# Patient Record
Sex: Male | Born: 1970
Health system: Southern US, Community
[De-identification: ages and names within clinical notes are randomized; demographics above are authoritative.]

## PROBLEM LIST (undated history)

## (undated) DIAGNOSIS — F1910 Other psychoactive substance abuse, uncomplicated: Secondary | ICD-10-CM

## (undated) DIAGNOSIS — I10 Essential (primary) hypertension: Secondary | ICD-10-CM

## (undated) DIAGNOSIS — E119 Type 2 diabetes mellitus without complications: Secondary | ICD-10-CM

## (undated) HISTORY — DX: Other psychoactive substance abuse, uncomplicated: F19.10

## (undated) HISTORY — PX: TONSILLECTOMY: SUR1361

## (undated) HISTORY — DX: Essential (primary) hypertension: I10

---
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
## 2007-11-24 DIAGNOSIS — G4730 Sleep apnea, unspecified: Secondary | ICD-10-CM

## 2007-11-24 HISTORY — DX: Sleep apnea, unspecified: G47.30

## 2008-06-05 ENCOUNTER — Ambulatory Visit: Payer: Self-pay | Admitting: Family Medicine

## 2008-06-22 ENCOUNTER — Ambulatory Visit: Payer: Self-pay | Admitting: Family Medicine

## 2009-12-30 ENCOUNTER — Encounter: Admission: RE | Admit: 2009-12-30 | Discharge: 2009-12-30 | Payer: Self-pay | Admitting: Emergency Medicine

## 2010-12-14 ENCOUNTER — Encounter: Payer: Self-pay | Admitting: Emergency Medicine

## 2016-03-20 DIAGNOSIS — E669 Obesity, unspecified: Secondary | ICD-10-CM | POA: Insufficient documentation

## 2018-02-21 DIAGNOSIS — G4733 Obstructive sleep apnea (adult) (pediatric): Secondary | ICD-10-CM | POA: Diagnosis not present

## 2018-05-18 DIAGNOSIS — G4733 Obstructive sleep apnea (adult) (pediatric): Secondary | ICD-10-CM | POA: Diagnosis not present

## 2018-07-08 DIAGNOSIS — G4733 Obstructive sleep apnea (adult) (pediatric): Secondary | ICD-10-CM | POA: Diagnosis not present

## 2018-07-08 DIAGNOSIS — R030 Elevated blood-pressure reading, without diagnosis of hypertension: Secondary | ICD-10-CM | POA: Diagnosis not present

## 2018-07-08 DIAGNOSIS — E669 Obesity, unspecified: Secondary | ICD-10-CM | POA: Diagnosis not present

## 2018-08-18 DIAGNOSIS — G4733 Obstructive sleep apnea (adult) (pediatric): Secondary | ICD-10-CM | POA: Diagnosis not present

## 2018-11-28 DIAGNOSIS — G4733 Obstructive sleep apnea (adult) (pediatric): Secondary | ICD-10-CM | POA: Diagnosis not present

## 2018-12-29 DIAGNOSIS — G4733 Obstructive sleep apnea (adult) (pediatric): Secondary | ICD-10-CM | POA: Diagnosis not present

## 2019-01-27 DIAGNOSIS — G4733 Obstructive sleep apnea (adult) (pediatric): Secondary | ICD-10-CM | POA: Diagnosis not present

## 2019-02-27 DIAGNOSIS — G4733 Obstructive sleep apnea (adult) (pediatric): Secondary | ICD-10-CM | POA: Diagnosis not present

## 2019-06-12 DIAGNOSIS — G4733 Obstructive sleep apnea (adult) (pediatric): Secondary | ICD-10-CM | POA: Diagnosis not present

## 2019-06-28 DIAGNOSIS — L918 Other hypertrophic disorders of the skin: Secondary | ICD-10-CM | POA: Diagnosis not present

## 2019-06-28 DIAGNOSIS — L820 Inflamed seborrheic keratosis: Secondary | ICD-10-CM | POA: Diagnosis not present

## 2019-07-10 DIAGNOSIS — G4733 Obstructive sleep apnea (adult) (pediatric): Secondary | ICD-10-CM | POA: Diagnosis not present

## 2019-07-13 DIAGNOSIS — G4733 Obstructive sleep apnea (adult) (pediatric): Secondary | ICD-10-CM | POA: Diagnosis not present

## 2019-09-13 DIAGNOSIS — G4733 Obstructive sleep apnea (adult) (pediatric): Secondary | ICD-10-CM | POA: Diagnosis not present

## 2019-09-24 DIAGNOSIS — E119 Type 2 diabetes mellitus without complications: Secondary | ICD-10-CM

## 2019-09-24 HISTORY — DX: Type 2 diabetes mellitus without complications: E11.9

## 2019-10-15 ENCOUNTER — Other Ambulatory Visit: Payer: Self-pay

## 2019-10-15 ENCOUNTER — Emergency Department (HOSPITAL_COMMUNITY): Payer: BC Managed Care – PPO

## 2019-10-15 ENCOUNTER — Emergency Department (HOSPITAL_COMMUNITY)
Admission: EM | Admit: 2019-10-15 | Discharge: 2019-10-15 | Disposition: A | Discharge status: Home / Self Care | Payer: BC Managed Care – PPO | Attending: Emergency Medicine | Admitting: Emergency Medicine

## 2019-10-15 ENCOUNTER — Encounter (HOSPITAL_COMMUNITY): Payer: Self-pay

## 2019-10-15 DIAGNOSIS — R55 Syncope and collapse: Secondary | ICD-10-CM | POA: Diagnosis not present

## 2019-10-15 DIAGNOSIS — Z23 Encounter for immunization: Secondary | ICD-10-CM | POA: Diagnosis not present

## 2019-10-15 DIAGNOSIS — E119 Type 2 diabetes mellitus without complications: Secondary | ICD-10-CM | POA: Diagnosis not present

## 2019-10-15 DIAGNOSIS — R000 Tachycardia, unspecified: Secondary | ICD-10-CM | POA: Diagnosis not present

## 2019-10-15 DIAGNOSIS — S0001XA Abrasion of scalp, initial encounter: Secondary | ICD-10-CM | POA: Diagnosis not present

## 2019-10-15 DIAGNOSIS — S8992XA Unspecified injury of left lower leg, initial encounter: Secondary | ICD-10-CM | POA: Diagnosis not present

## 2019-10-15 DIAGNOSIS — S0990XA Unspecified injury of head, initial encounter: Secondary | ICD-10-CM | POA: Diagnosis not present

## 2019-10-15 DIAGNOSIS — S0101XA Laceration without foreign body of scalp, initial encounter: Secondary | ICD-10-CM | POA: Diagnosis not present

## 2019-10-15 DIAGNOSIS — R519 Headache, unspecified: Secondary | ICD-10-CM | POA: Diagnosis not present

## 2019-10-15 LAB — CBC
HCT: 50.1 % (ref 39.0–52.0)
Hemoglobin: 17.4 g/dL — ABNORMAL HIGH (ref 13.0–17.0)
MCH: 31.6 pg (ref 26.0–34.0)
MCHC: 34.7 g/dL (ref 30.0–36.0)
MCV: 90.9 fL (ref 80.0–100.0)
Platelets: 178 10*3/uL (ref 150–400)
RBC: 5.51 MIL/uL (ref 4.22–5.81)
RDW: 11.1 % — ABNORMAL LOW (ref 11.5–15.5)
WBC: 6.5 10*3/uL (ref 4.0–10.5)
nRBC: 0 % (ref 0.0–0.2)

## 2019-10-15 LAB — URINALYSIS, ROUTINE W REFLEX MICROSCOPIC
Bacteria, UA: NONE SEEN
Bilirubin Urine: NEGATIVE
Glucose, UA: >=500 mg/dL — AB
Hgb urine dipstick: NEGATIVE
Ketones, ur: 20 mg/dL — AB
Leukocytes,Ua: NEGATIVE
Nitrite: NEGATIVE
Protein, ur: NEGATIVE mg/dL
Specific Gravity, Urine: >1.046 — ABNORMAL HIGH (ref 1.005–1.030)
pH: 5 (ref 5.0–8.0)

## 2019-10-15 LAB — BASIC METABOLIC PANEL
Anion gap: 9 (ref 5–15)
BUN: 16 mg/dL (ref 6–20)
CO2: 23 mmol/L (ref 22–32)
Calcium: 9.4 mg/dL (ref 8.9–10.3)
Chloride: 103 mmol/L (ref 98–111)
Creatinine, Ser: 0.96 mg/dL (ref 0.61–1.24)
GFR calc Af Amer: >60 mL/min (ref >60)
GFR calc non Af Amer: >60 mL/min (ref >60)
Glucose, Bld: 372 mg/dL — ABNORMAL HIGH (ref 70–99)
Potassium: 4.2 mmol/L (ref 3.5–5.1)
Sodium: 135 mmol/L (ref 135–145)

## 2019-10-15 LAB — CBG MONITORING, ED: Glucose-Capillary: 387 mg/dL — ABNORMAL HIGH (ref 70–99)

## 2019-10-15 MED ORDER — METFORMIN HCL 500 MG PO TABS
500.0000 mg | ORAL_TABLET | Freq: Once | ORAL | Status: AC
  Administered 2019-10-15: 500 mg via ORAL
  Filled 2019-10-15: qty 1

## 2019-10-15 MED ORDER — SODIUM CHLORIDE 0.9 % IV BOLUS
1000.0000 mL | Freq: Once | INTRAVENOUS | Status: AC
  Administered 2019-10-15: 1000 mL via INTRAVENOUS

## 2019-10-15 MED ORDER — METFORMIN HCL 500 MG PO TABS: 500.0000 mg | ORAL_TABLET | Freq: Two times a day (BID) | ORAL | 2 refills | Status: DC

## 2019-10-15 MED ORDER — TETANUS-DIPHTH-ACELL PERTUSSIS 5-2.5-18.5 LF-MCG/0.5 IM SUSP
0.5000 mL | Freq: Once | INTRAMUSCULAR | Status: AC
  Administered 2019-10-15: 0.5 mL via INTRAMUSCULAR
  Filled 2019-10-15: qty 0.5

## 2019-10-15 MED ORDER — BACITRACIN ZINC 500 UNIT/GM EX OINT
TOPICAL_OINTMENT | Freq: Once | CUTANEOUS | Status: AC
  Administered 2019-10-15: 1 via TOPICAL
  Filled 2019-10-15: qty 0.9

## 2019-10-15 MED ORDER — SODIUM CHLORIDE 0.9% FLUSH
3.0000 mL | Freq: Once | INTRAVENOUS | Status: AC
  Administered 2019-10-15: 3 mL via INTRAVENOUS

## 2019-10-15 NOTE — Discharge Instructions (Addendum)
As we discussed today, your work-up today was reassuring.  Your blood work was concerning for diabetes.  Given concerns, we have started you on metformin which you will take twice a day.  You will need to follow-up with a primary care doctor to have continued prescription of your Metformin as well as an A1c checked.  As we discussed, Metformin can cause you to have some diarrhea.  Make sure you are drinking plenty of fluids and staying hydrated.  I provided some information about diabetes for you.  Ultimately, this needs further work-up by primary care doctor.  You can either follow-up with referred to [facility] wellness clinic or follow-up with your primary care doctor of your choosing.  Keep your wounds clean and dry.  You can wash them with soap and water and make sure that you pat them dry before putting any bandage.  You can apply Neosporin or bacitracin 2 times a day to prevent any infection.  Return the emergency department for any chest pain, difficulty breathing, loss of consciousness, vomiting, redness or swelling around the wounds, drainage from the wounds or any other worsening concerning symptoms.

## 2019-10-15 NOTE — ED Notes (Signed)
Lindsay PA at bedside.  

## 2019-10-15 NOTE — ED Provider Notes (Signed)
[place] DEPT Provider Note   CSN: [id number] Arrival date & time: 10/15/19  1543     History   Chief Complaint Chief Complaint  Patient presents with  . Loss of Consciousness    HPI Terry Bonilla is a 48 y.o. male who presents for evaluation of syncopal episode.  He states that about 2 PM this afternoon, he decided to go lay down for a nap.  He states that when he got on the bed, he felt dizzy.  He states he got up and felt dizzy and lightheaded and states that he had a syncopal episode and hit his head on the nightstand.  He thinks he may have had LOC for a few seconds.  He states that he was able to get up immediately afterwards and has been at baseline since then.  He has not had any preceding chest pain.  On ED arrival, he is complaining of slight headache where he hit his head on the nightstand.  He sustained a laceration/abrasion noted to the head.  He also reports some pain to his left knee where he hit it on the floor.  Patient states that he does not know when his last tetanus shot was.  He denies any chest pain, difficulty breathing, abdominal pain, numbness/weakness of his arms or legs.  He states he has been in his normal state of health and has not had any recent sicknesses.  He has noted he had some lower back pain over the last few days. He denies any hormone use, recent immobilization, prior history of DVT/PE, recent surgery, leg swelling, or long travel.     The history is provided by the patient.    History reviewed. No pertinent past medical history.  There are no active problems to display for this patient.    The histories are not reviewed yet. Please review them in the "History" navigator section and refresh this [place].      Home Medications    Prior to Admission medications   Medication Sig Start Date End Date Taking? Authorizing Provider  metFORMIN (GLUCOPHAGE) 500 MG tablet Take 1 tablet (500 mg total) by mouth 2  (two) times daily with a meal. 10/15/19 01/13/20  Volanda Napoleon, PA-C    Family History History reviewed. No pertinent family history.  Social History Social History   Tobacco Use  . Smoking status: Not on file  Substance Use Topics  . Alcohol use: Not on file  . Drug use: Not on file     Allergies   Sulfa antibiotics   Review of Systems Review of Systems  Constitutional: Negative for fever.  Respiratory: Negative for cough and shortness of breath.   Cardiovascular: Negative for chest pain.  Gastrointestinal: Negative for abdominal pain, nausea and vomiting.  Genitourinary: Negative for dysuria and hematuria.  Skin: Positive for wound.  Neurological: Positive for dizziness, syncope and light-headedness. Negative for headaches.  All other systems reviewed and are negative.    Physical Exam Updated Vital Signs BP 138/87   Pulse 95   Temp 98.4 F (36.9 C) (Oral)   Resp 18   SpO2 98%   Physical Exam Vitals signs and nursing note reviewed.  Constitutional:      Appearance: Normal appearance. He is well-developed.  HENT:     Head: Normocephalic and atraumatic.      Comments: Abrasion/laceration noted to the right parietal scalp.  Bleeding controlled.  No bony tenderness or skull deformity noted. Eyes:  General: Lids are normal.     Conjunctiva/sclera: Conjunctivae normal.     Pupils: Pupils are equal, round, and reactive to light.     Comments: PERRL. EOMs intact. No nystagmus. No neglect.   Neck:     Musculoskeletal: Full passive range of motion without pain.  Cardiovascular:     Rate and Rhythm: Normal rate and regular rhythm.     Pulses: Normal pulses.          Radial pulses are 2+ on the right side and 2+ on the left side.       Dorsalis pedis pulses are 2+ on the right side and 2+ on the left side.     Heart sounds: Normal heart sounds. No murmur. No friction rub. No gallop.   Pulmonary:     Effort: Pulmonary effort is normal.     Breath sounds:  Normal breath sounds.     Comments: Lungs clear to auscultation bilaterally.  Symmetric chest rise.  No wheezing, rales, rhonchi. Abdominal:     Palpations: Abdomen is soft. Abdomen is not rigid.     Tenderness: There is no abdominal tenderness. There is no guarding.     Comments: Abdomen is soft, non-distended, non-tender. No rigidity, No guarding. No peritoneal signs.  Musculoskeletal: Normal range of motion.     Comments: Midline L-spine tenderness.  Tenderness palpation noted to paraspinal muscles of the lower lumbar region the left side.  Tenderness palpation of the anterior aspect of left knee.  No deformity or crepitus noted.  No overlying soft tissue swelling, ecchymosis.  Skin:    General: Skin is warm and dry.     Capillary Refill: Capillary refill takes less than 2 seconds.     Comments: 3 cm abrasion/skin tear noted to the parietal scalp.  Scattered abrasions on the anterior aspect of left knee.  Neurological:     Mental Status: He is alert and oriented to person, place, and time.  Psychiatric:        Speech: Speech normal.      ED Treatments / Results  Labs (all labs ordered are listed, but only abnormal results are displayed) Labs Reviewed  BASIC METABOLIC PANEL - Abnormal; Notable for the following components:      Result Value   Glucose, Bld 372 (*)    All other components within normal limits  CBC - Abnormal; Notable for the following components:   Hemoglobin 17.4 (*)    RDW 11.1 (*)    All other components within normal limits  URINALYSIS, ROUTINE W REFLEX MICROSCOPIC - Abnormal; Notable for the following components:   Specific Gravity, Urine >1.046 (*)    Glucose, UA >=500 (*)    Ketones, ur 20 (*)    All other components within normal limits  CBG MONITORING, ED - Abnormal; Notable for the following components:   Glucose-Capillary 387 (*)    All other components within normal limits  CBG MONITORING, ED    EKG EKG Interpretation  Date/Time:  Sunday  October 15 2019 16:04:53 EST Ventricular Rate:  111 PR Interval:    QRS Duration: 88 QT Interval:  330 QTC Calculation: 449 R Axis:   131 Text Interpretation: Sinus tachycardia Right axis deviation Nonspecific T abnormalities, inferior leads Baseline wander in lead(s) V2 V3 V4 No old tracing to compare Confirmed by Sherwood Gambler [phone]) on 10/15/2019 6:28:32 PM   Radiology Ct Head Wo Contrast  Result Date: 10/15/2019 CLINICAL DATA:  Head trauma.  Headache. EXAM: CT HEAD WITHOUT  CONTRAST TECHNIQUE: Contiguous axial images were obtained from the base of the skull through the vertex without intravenous contrast. COMPARISON:  None. FINDINGS: Brain: No evidence of acute infarction, hemorrhage, hydrocephalus, extra-axial collection or mass lesion/mass effect. Vascular: No hyperdense vessel or unexpected calcification. Skull: Normal. Negative for fracture or focal lesion. Sinuses/Orbits: Globes and orbits are unremarkable. Sinuses and mastoid air cells are clear. Other: None. IMPRESSION: Normal enhanced CT scan of the brain. Electronically Signed   By: Lajean Manes M.D.   On: 10/15/2019 19:19   Dg Knee Complete 4 Views Left  Result Date: 10/15/2019 CLINICAL DATA:  Recent syncopal episode with knee injury, initial encounter EXAM: LEFT KNEE - COMPLETE 4+ VIEW COMPARISON:  None FINDINGS: No evidence of fracture, dislocation, or joint effusion. No evidence of arthropathy or other focal bone abnormality. Soft tissues are unremarkable. IMPRESSION: No acute abnormality noted. Electronically Signed   By: Inez Catalina M.D.   On: 10/15/2019 19:07    Procedures Procedures (including critical care time)  Medications Ordered in ED Medications  sodium chloride flush (NS) 0.9 % injection 3 mL (3 mLs Intravenous Given 10/15/19 1839)  Tdap (BOOSTRIX) injection 0.5 mL (0.5 mLs Intramuscular Given 10/15/19 2010)  sodium chloride 0.9 % bolus 1,000 mL (1,000 mLs Intravenous New Bag/Given 10/15/19 2018)   bacitracin ointment (1 application Topical Given 10/15/19 2059)  metFORMIN (GLUCOPHAGE) tablet 500 mg (500 mg Oral Given 10/15/19 2056)     Initial Impression / Assessment and Plan / ED Course  I have reviewed the triage vital signs and the nursing notes.  Pertinent labs & imaging results that were available during my care of the patient were reviewed by me and considered in my medical decision making (see chart for details).        48 year old male who presents for evaluation of syncopal episode.  Reports that he went to go take a nap about 2 PM this afternoon.  He states that when he went to go take a nap, he felt dizzy and stood up and had a syncopal episode.  He hit his head against the nightstand.  No chest pain.  On initial ED arrival,, he is afebrile, nontoxic-appearing.  He is slightly hypertensive and tachycardic.  O2 sats are stable.  No neuro deficits noted on exam.  He has no nystagmus.  Do not suspect central or peripheral vertigo.  Question of this was orthostatic hypotension.  History/physical exam not concerning for ACS etiology, aortic dissection, PE.  Will plan for labs, imaging, EKG.  Given that he did have LOC, will plan for head CT for evaluation.  CBC shows no leukocytosis. Hgb is stable at 17.4. BMP shows normal BUN/Cr. Blood glucose is elevated at 372. He has no history of DM. UA shows glucosuria. No evidence of infectious etiology.   Given concerns for diabetes, will plan to start him on Metformin.  CT negative for any acute abnormalities.  Reevaluation after wound cleaning.  He has a skin tear noted on the right parietal aspect of his scalp.  No evidence of laceration that requires suturing.  Wound cleaned and will apply bacitracin.  Patient resting comfortably.  He is hemodynamically stable.  I discussed with patient regarding starting Metformin.  He is agreeable to plan.  He does not currently have a PCP.  Instructed him to either follow-up with [facility]  cl[facility] or obtain a primary care doctor.  He has been ambulatory without any difficulty.  He denies any complaints at this time.  Patient stable  for discharge at this time. Discussed patient with Dr. Regenia Skeeter who is agreeable to plan.   Portions of this note were generated with [employer]. Dictation errors may occur despite best attempts at proofreading.   Final Clinical Impressions(s) / ED Diagnoses   Final diagnoses:  Syncope, unspecified syncope type  Type 2 diabetes mellitus without complication, without long-term current use of insulin [facility])    ED Discharge Orders         Ordered    metFORMIN (GLUCOPHAGE) 500 MG tablet  2 times daily with meals     10/15/19 2126           Desma Mcgregor 10/15/19 2128    Sherwood Gambler, MD 10/16/19 1022

## 2019-10-15 NOTE — ED Notes (Signed)
BLEEDING CONTROLLED TO LACERATION OF THE HEAD. PT ALERT AND ACTIVE WITH CARE

## 2019-10-15 NOTE — ED Triage Notes (Signed)
Pt states he was about to get in bed for a nap when he got dizzy laying down. Pt then sat up, and then stood up, causing him to "pass out for a second". Pt has skin tear on right side of head and abrasion on left knee. Pt A&Ox4.

## 2019-10-15 NOTE — ED Notes (Signed)
Pt verbalizes understanding of DC instructions. Pt belongings returned and is ambulatory out of ED.  

## 2019-10-18 ENCOUNTER — Emergency Department (HOSPITAL_COMMUNITY)
Admission: EM | Admit: 2019-10-18 | Discharge: 2019-10-18 | Disposition: A | Discharge status: Home / Self Care | Payer: BC Managed Care – PPO | Attending: Emergency Medicine | Admitting: Emergency Medicine

## 2019-10-18 ENCOUNTER — Other Ambulatory Visit: Payer: Self-pay

## 2019-10-18 ENCOUNTER — Encounter (HOSPITAL_COMMUNITY): Payer: Self-pay

## 2019-10-18 ENCOUNTER — Ambulatory Visit: Payer: Self-pay

## 2019-10-18 DIAGNOSIS — E119 Type 2 diabetes mellitus without complications: Secondary | ICD-10-CM | POA: Insufficient documentation

## 2019-10-18 DIAGNOSIS — R002 Palpitations: Secondary | ICD-10-CM | POA: Insufficient documentation

## 2019-10-18 DIAGNOSIS — R42 Dizziness and giddiness: Secondary | ICD-10-CM | POA: Diagnosis not present

## 2019-10-18 DIAGNOSIS — I10 Essential (primary) hypertension: Secondary | ICD-10-CM | POA: Diagnosis not present

## 2019-10-18 DIAGNOSIS — I499 Cardiac arrhythmia, unspecified: Secondary | ICD-10-CM | POA: Diagnosis not present

## 2019-10-18 DIAGNOSIS — R55 Syncope and collapse: Secondary | ICD-10-CM | POA: Diagnosis not present

## 2019-10-18 DIAGNOSIS — Z79899 Other long term (current) drug therapy: Secondary | ICD-10-CM | POA: Diagnosis not present

## 2019-10-18 DIAGNOSIS — E1165 Type 2 diabetes mellitus with hyperglycemia: Secondary | ICD-10-CM | POA: Diagnosis not present

## 2019-10-18 LAB — COMPREHENSIVE METABOLIC PANEL
ALT: 51 U/L — ABNORMAL HIGH (ref 0–44)
AST: 34 U/L (ref 15–41)
Albumin: 4.6 g/dL (ref 3.5–5.0)
Alkaline Phosphatase: 70 U/L (ref 38–126)
Anion gap: 9 (ref 5–15)
BUN: 12 mg/dL (ref 6–20)
CO2: 25 mmol/L (ref 22–32)
Calcium: 9.1 mg/dL (ref 8.9–10.3)
Chloride: 103 mmol/L (ref 98–111)
Creatinine, Ser: 0.68 mg/dL (ref 0.61–1.24)
GFR calc Af Amer: >60 mL/min (ref >60)
GFR calc non Af Amer: >60 mL/min (ref >60)
Glucose, Bld: 197 mg/dL — ABNORMAL HIGH (ref 70–99)
Potassium: 3.9 mmol/L (ref 3.5–5.1)
Sodium: 137 mmol/L (ref 135–145)
Total Bilirubin: 1.2 mg/dL (ref 0.3–1.2)
Total Protein: 7.1 g/dL (ref 6.5–8.1)

## 2019-10-18 LAB — CBC WITH DIFFERENTIAL/PLATELET
Abs Immature Granulocytes: 0.03 10*3/uL (ref 0.00–0.07)
Basophils Absolute: 0 10*3/uL (ref 0.0–0.1)
Basophils Relative: 1 %
Eosinophils Absolute: 0 10*3/uL (ref 0.0–0.5)
Eosinophils Relative: 0 %
HCT: 46.5 % (ref 39.0–52.0)
Hemoglobin: 15.9 g/dL (ref 13.0–17.0)
Immature Granulocytes: 1 %
Lymphocytes Relative: 19 %
Lymphs Abs: 1.1 10*3/uL (ref 0.7–4.0)
MCH: 31.1 pg (ref 26.0–34.0)
MCHC: 34.2 g/dL (ref 30.0–36.0)
MCV: 91 fL (ref 80.0–100.0)
Monocytes Absolute: 0.3 10*3/uL (ref 0.1–1.0)
Monocytes Relative: 6 %
Neutro Abs: 4 10*3/uL (ref 1.7–7.7)
Neutrophils Relative %: 73 %
Platelets: 167 10*3/uL (ref 150–400)
RBC: 5.11 MIL/uL (ref 4.22–5.81)
RDW: 11.1 % — ABNORMAL LOW (ref 11.5–15.5)
WBC: 5.5 10*3/uL (ref 4.0–10.5)
nRBC: 0 % (ref 0.0–0.2)

## 2019-10-18 MED ORDER — METFORMIN HCL ER 750 MG PO TB24: 750.0000 mg | ORAL_TABLET | Freq: Every day | ORAL | 0 refills | Status: DC

## 2019-10-18 MED ORDER — SODIUM CHLORIDE 0.9 % IV BOLUS
1000.0000 mL | Freq: Once | INTRAVENOUS | Status: AC
  Administered 2019-10-18: 1000 mL via INTRAVENOUS

## 2019-10-18 NOTE — Telephone Encounter (Signed)
Pt. Reports he was seen in ED recently and diagnosed as Type 2 diabetic. Started Metformin. Reports since starting the medication, he has had dizziness after taking the medicine. Last night it was so bad he called EMS. Had heart palpitations as well. EMS said he had stable vital signs and he felt better. Was fine this morning until he took the Metformin and became dizzy shortly after. Warm transfer to [facility] in the practice for a visit.  Answer Assessment - Initial Assessment Questions 1. DESCRIPTION: "Describe your dizziness."     Lightheaded 2. LIGHTHEADED: "Do you feel lightheaded?" (e.g., somewhat faint, woozy, weak upon standing)     Yes 3. VERTIGO: "Do you feel like either you or the room is spinning or tilting?" (i.e. vertigo)     No 4. SEVERITY: "How bad is it?"  "Do you feel like you are going to faint?" "Can you stand and walk?"   - MILD - walking normally   - MODERATE - interferes with normal activities (e.g., work, school)    - SEVERE - unable to stand, requires support to walk, feels like passing out now.      Moderate 5. ONSET:  "When did the dizziness begin?"     Started last night 6. AGGRAVATING FACTORS: "Does anything make it worse?" (e.g., standing, change in head position)     Moving around 7. HEART RATE: "Can you tell me your heart rate?" "How many beats in 15 seconds?"  (Note: not all patients can do this)       Pulse last night - normal 8. CAUSE: "What do you think is causing the dizziness?"     Pt. Feels like it is related to Metformin 9. RECURRENT SYMPTOM: "Have you had dizziness before?" If so, ask: "When was the last time?" "What happened that time?"     No 10. OTHER SYMPTOMS: "Do you have any other symptoms?" (e.g., fever, chest pain, vomiting, diarrhea, bleeding)       Heart fluttering last night 11. PREGNANCY: "Is there any chance you are pregnant?" "When was your last menstrual period?"       N/A  Protocols used: DIZZINESS [facility]

## 2019-10-18 NOTE — ED Notes (Signed)
Pt verbalizes understanding of DC instructions. Pt belongings returned and is ambulatory out of ED.  

## 2019-10-18 NOTE — Discharge Instructions (Addendum)
You are being prescribed extended release Metformin.  Stop your previous prescription start this one tomorrow morning.  If you develop palpitations that do not go away, dizziness, chest pain, shortness of breath, or any other new/concerning symptoms then return to the ER for evaluation.

## 2019-10-18 NOTE — Telephone Encounter (Signed)
I talked to Dr Zigmund Daniel and he was going to look into this and have someone call pt back

## 2019-10-18 NOTE — ED Notes (Signed)
Regenia Skeeter MD at bedside

## 2019-10-18 NOTE — ED Triage Notes (Signed)
  PT arrived via [facility] from home CC dizziness and palpitation. Pt denies pain,  syncope or falling. CBG 235  Hx recent diabetes diagnosis started metformin.  18G LAC 500 mlNS

## 2019-10-18 NOTE — ED Provider Notes (Signed)
[place] DEPT Provider Note   CSN: [id number] Arrival date & time: 10/18/19  1425     History   Chief Complaint Chief Complaint  Patient presents with  . Dizziness  . Palpitations    HPI Terry Bonilla is a 48 y.o. male.     HPI  48 year old male presents with palpitations and near syncope.  The patient states that he was here 3 days ago when he stood up and developed lightheadedness and fell.  Had a head injury and was diagnosed with diabetes.  Placed on Metformin.  Yesterday he developed palpitations and lightheadedness and EMS was called.  After an hour or so he started to feel better so he was not transported.  He states this was an hour or 2 after taking the Metformin but is not sure if they are related.  Today again this morning he developed palpitations and dizziness the felt like he would pass out.  Lasted multiple hours.  Went away and then came back so he called EMS.  Currently while talking to him he is asymptomatic.  No chest pain or shortness of breath.  No headache or weakness.  He has been having diarrhea since starting the Metformin. His toes have been tingling.  Past Medical History:  Diagnosis Date  . Diabetes mellitus without complication ([place])     There are no active problems to display for this patient.   History reviewed. No pertinent surgical history.      Home Medications    Prior to Admission medications   Medication Sig Start Date End Date Taking? Authorizing Provider  betamethasone dipropionate 0.05 % cream Apply 1 application topically as needed. 02/04/17  Yes [provider]  finasteride (PROPECIA) 1 MG tablet Take 1 tablet by mouth daily. 12/11/13  Yes [provider]  metFORMIN (GLUCOPHAGE-XR) 750 MG 24 hr tablet Take 1 tablet (750 mg total) by mouth daily with breakfast. 10/18/19   Sherwood Gambler, MD    Family History No family history on file.  Social History Social History    Tobacco Use  . Smoking status: Never Smoker  . Smokeless tobacco: Never Used  Substance Use Topics  . Alcohol use: Not on file  . Drug use: Not on file     Allergies   Crab [shellfish allergy] and Sulfa antibiotics   Review of Systems Review of Systems  Constitutional: Negative for fever.  Respiratory: Negative for shortness of breath.   Cardiovascular: Positive for palpitations. Negative for chest pain.  Gastrointestinal: Positive for diarrhea and nausea. Negative for abdominal pain and vomiting.  Neurological: Positive for light-headedness. Negative for weakness and headaches.  All other systems reviewed and are negative.    Physical Exam Updated Vital Signs BP (!) 138/95   Pulse 74   Temp 98.2 F (36.8 C) (Oral)   Resp 17   Ht 6\' 3"  (1.905 m)   Wt 120.2 kg   SpO2 97%   BMI 33.12 kg/m   Physical Exam Vitals signs and nursing note reviewed.  Constitutional:      General: He is not in acute distress.    Appearance: He is well-developed. He is obese. He is not ill-appearing or diaphoretic.  HENT:     Head: Normocephalic and atraumatic.     Right Ear: External ear normal.     Left Ear: External ear normal.     Nose: Nose normal.  Eyes:     General:        Right  eye: No discharge.        Left eye: No discharge.     Extraocular Movements: Extraocular movements intact.     Pupils: Pupils are equal, round, and reactive to light.  Neck:     Musculoskeletal: Neck supple.  Cardiovascular:     Rate and Rhythm: Normal rate and regular rhythm.     Heart sounds: Normal heart sounds. No murmur.  Pulmonary:     Effort: Pulmonary effort is normal.     Breath sounds: Normal breath sounds.  Abdominal:     Palpations: Abdomen is soft.     Tenderness: There is no abdominal tenderness.  Skin:    General: Skin is warm and dry.  Neurological:     Mental Status: He is alert and oriented to person, place, and time.     Comments: CN 3-12 grossly intact. 5/5 strength in all  4 extremities. Grossly normal sensation. Normal finger to nose.   Psychiatric:        Mood and Affect: Mood is not anxious.      ED Treatments / Results  Labs (all labs ordered are listed, but only abnormal results are displayed) Labs Reviewed  COMPREHENSIVE METABOLIC PANEL - Abnormal; Notable for the following components:      Result Value   Glucose, Bld 197 (*)    ALT 51 (*)    All other components within normal limits  CBC WITH DIFFERENTIAL/PLATELET - Abnormal; Notable for the following components:   RDW 11.1 (*)    All other components within normal limits    EKG EKG Interpretation  Date/Time:  Wednesday October 18 2019 15:14:22 EST Ventricular Rate:  83 PR Interval:    QRS Duration: 93 QT Interval:  370 QTC Calculation: 435 R Axis:   85 Text Interpretation: Sinus rhythm no acute ST/T changes tachycardia no longer present compared to Oct 15 2019 Confirmed by Sherwood Gambler [phone]) on 10/18/2019 4:28:44 PM   Radiology No results found.  Procedures Procedures (including critical care time)  Medications Ordered in ED Medications  sodium chloride 0.9 % bolus 1,000 mL (0 mLs Intravenous Stopped 10/18/19 1727)     Initial Impression / Assessment and Plan / ED Course  I have reviewed the triage vital signs and the nursing notes.  Pertinent labs & imaging results that were available during my care of the patient were reviewed by me and considered in my medical decision making (see chart for details).        Patient's lab work is reassuring.  ECG without obvious acute abnormalities or obvious cause of near syncope such as Brugada or WPW.  He is asking about changing his Metformin.  This could be a cause/side effect and we he would like to try extended release and see if this is different.  Otherwise he has follow-up on 11/30.  I will also refer to cardiology given the palpitations component and he probably would be benefited from Holter monitoring.  Otherwise he  appears stable for discharge home and has no strokelike symptoms.  We discussed return precautions.  Final Clinical Impressions(s) / ED Diagnoses   Final diagnoses:  Palpitations  Lightheadedness    ED Discharge Orders         Ordered    metFORMIN (GLUCOPHAGE-XR) 750 MG 24 hr tablet  Daily with breakfast     10/18/19 1750           Sherwood Gambler, MD 10/18/19 1756

## 2019-10-18 NOTE — Telephone Encounter (Signed)
Patient said he'll continue meds until visit on Monday.

## 2019-10-18 NOTE — Telephone Encounter (Signed)
Previous notes and labs reviewed. Blood glucose in ED >300.  The dizziness is likely coming from his body adjusting to a new, more normal blood sugar for him.  As he continues medication and his body adjusts this should improve.  He should continue current medication until seen on Monday.

## 2019-10-23 ENCOUNTER — Encounter: Payer: Self-pay | Admitting: Family Medicine

## 2019-10-23 ENCOUNTER — Ambulatory Visit (INDEPENDENT_AMBULATORY_CARE_PROVIDER_SITE_OTHER): Payer: BC Managed Care – PPO | Admitting: Family Medicine

## 2019-10-23 ENCOUNTER — Other Ambulatory Visit: Payer: Self-pay

## 2019-10-23 VITALS — BP 124/72 | HR 93 | Temp 97.8°F | Ht 75.0 in

## 2019-10-23 DIAGNOSIS — E1165 Type 2 diabetes mellitus with hyperglycemia: Secondary | ICD-10-CM

## 2019-10-23 DIAGNOSIS — Z125 Encounter for screening for malignant neoplasm of prostate: Secondary | ICD-10-CM | POA: Diagnosis not present

## 2019-10-23 DIAGNOSIS — E1140 Type 2 diabetes mellitus with diabetic neuropathy, unspecified: Secondary | ICD-10-CM | POA: Diagnosis not present

## 2019-10-23 DIAGNOSIS — Z23 Encounter for immunization: Secondary | ICD-10-CM

## 2019-10-23 DIAGNOSIS — D229 Melanocytic nevi, unspecified: Secondary | ICD-10-CM

## 2019-10-23 DIAGNOSIS — Z0000 Encounter for general adult medical examination without abnormal findings: Secondary | ICD-10-CM | POA: Diagnosis not present

## 2019-10-23 DIAGNOSIS — Z130 Encounter for screening for diseases of the blood and blood-forming organs and certain disorders involving the immune mechanism: Secondary | ICD-10-CM | POA: Insufficient documentation

## 2019-10-23 LAB — CBC
HCT: 47.4 % (ref 39.0–52.0)
Hemoglobin: 16.8 g/dL (ref 13.0–17.0)
MCHC: 35.5 g/dL (ref 30.0–36.0)
MCV: 90.7 fl (ref 78.0–100.0)
Platelets: 179 10*3/uL (ref 150.0–400.0)
RBC: 5.22 Mil/uL (ref 4.22–5.81)
RDW: 12.3 % (ref 11.5–15.5)
WBC: 4.2 10*3/uL (ref 4.0–10.5)

## 2019-10-23 LAB — URINALYSIS, ROUTINE W REFLEX MICROSCOPIC
Bilirubin Urine: NEGATIVE
Hgb urine dipstick: NEGATIVE
Ketones, ur: 15 — AB
Leukocytes,Ua: NEGATIVE
Nitrite: NEGATIVE
RBC / HPF: NONE SEEN (ref 0–?)
Specific Gravity, Urine: 1.025 (ref 1.000–1.030)
Total Protein, Urine: NEGATIVE
Urine Glucose: 500 — AB
Urobilinogen, UA: 0.2 (ref 0.0–1.0)
pH: 6 (ref 5.0–8.0)

## 2019-10-23 LAB — MICROALBUMIN / CREATININE URINE RATIO
Creatinine,U: 155.7 mg/dL
Microalb Creat Ratio: 1.2 mg/g (ref 0.0–30.0)
Microalb, Ur: 1.8 mg/dL (ref 0.0–1.9)

## 2019-10-23 LAB — HEMOGLOBIN A1C: Hgb A1c MFr Bld: 10 % — ABNORMAL HIGH (ref 4.6–6.5)

## 2019-10-23 LAB — LIPID PANEL
Cholesterol: 160 mg/dL (ref 0–200)
HDL: 27.3 mg/dL — ABNORMAL LOW (ref >39.00)
LDL Cholesterol: 113 mg/dL — ABNORMAL HIGH (ref 0–99)
NonHDL: 132.21
Total CHOL/HDL Ratio: 6
Triglycerides: 97 mg/dL (ref 0.0–149.0)
VLDL: 19.4 mg/dL (ref 0.0–40.0)

## 2019-10-23 LAB — COMPREHENSIVE METABOLIC PANEL
ALT: 76 U/L — ABNORMAL HIGH (ref 0–53)
AST: 45 U/L — ABNORMAL HIGH (ref 0–37)
Albumin: 4.2 g/dL (ref 3.5–5.2)
Alkaline Phosphatase: 69 U/L (ref 39–117)
BUN: 18 mg/dL (ref 6–23)
CO2: 22 mEq/L (ref 19–32)
Calcium: 9.1 mg/dL (ref 8.4–10.5)
Chloride: 104 mEq/L (ref 96–112)
Creatinine, Ser: 0.66 mg/dL (ref 0.40–1.50)
GFR: 127.46 mL/min (ref >60.00)
Glucose, Bld: 176 mg/dL — ABNORMAL HIGH (ref 70–99)
Potassium: 3.7 mEq/L (ref 3.5–5.1)
Sodium: 135 mEq/L (ref 135–145)
Total Bilirubin: 0.9 mg/dL (ref 0.2–1.2)
Total Protein: 6.5 g/dL (ref 6.0–8.3)

## 2019-10-23 LAB — LDL CHOLESTEROL, DIRECT: Direct LDL: 118 mg/dL

## 2019-10-23 LAB — PSA: PSA: 0.23 ng/mL (ref 0.10–4.00)

## 2019-10-23 NOTE — Patient Instructions (Signed)
Type 2 Diabetes Mellitus, Self Care, Adult  Caring for yourself after you have been diagnosed with type 2 diabetes (type 2 diabetes mellitus) means keeping your blood sugar (glucose) under control with a balance of:   Nutrition.   Exercise.   Lifestyle changes.   Medicines or insulin, if necessary.   Support from your team of health care providers and others.  The following information explains what you need to know to manage your diabetes at home.  What are the risks?  Having diabetes can put you at risk for other long-term (chronic) conditions, such as heart disease and kidney disease. Your health care provider may prescribe medicines to help prevent complications from diabetes. These medicines may include:   Aspirin.   Medicine to lower cholesterol.   Medicine to control blood pressure.  How to monitor blood glucose     Check your blood glucose every day, as often as told by your health care provider.   Have your A1c (hemoglobin A1c) level checked two or more times a year, or as often as told by your health care provider.  Your health care provider will set individualized treatment goals for you. Generally, the goal of treatment is to maintain the following blood glucose levels:   Before meals (preprandial): 80-130 mg/dL (4.4-7.2 mmol/L).   After meals (postprandial): below 180 mg/dL (10 mmol/L).   A1c level: less than 7%.  How to manage hyperglycemia and hypoglycemia  Hyperglycemia symptoms  Hyperglycemia, also called high blood glucose, occurs when blood glucose is too high. Make sure you know the early signs of hyperglycemia, such as:   Increased thirst.   Hunger.   Feeling very tired.   Needing to urinate more often than usual.   Blurry vision.  Hypoglycemia symptoms  Hypoglycemia, also called low blood glucose, occurswith a blood glucose level at or below 70 mg/dL (3.9 mmol/L). The risk for hypoglycemia increases during or after exercise, during sleep, during illness, and when skipping  meals or not eating for a long time (fasting).  It is important to know the symptoms of hypoglycemia and treat it right away. Always have a 15-gram rapid-acting carbohydrate snack with you to treat low blood glucose. Family members and close friends should also know the symptoms and should understand how to treat hypoglycemia, in case you are not able to treat yourself. Symptoms may include:   Hunger.   Anxiety.   Sweating and feeling clammy.   Confusion.   Dizziness or feeling light-headed.   Sleepiness.   Nausea.   Increased heart rate.   Headache.   Blurry vision.   Irritability.   A change in coordination.   Tingling or numbness around the mouth, lips, or tongue.   Restless sleep.   Fainting.   Seizure.  Treating hypoglycemia  If you are alert and able to swallow safely, follow the 15:15 rule:   Take 15 grams of a rapid-acting carbohydrate. Talk with your health care provider about how much you should take.   Rapid-acting options include:  ? Glucose pills (take 15 grams).  ? 6-8 pieces of hard candy.  ? 4-6 oz (120-150 mL) of fruit juice.  ? 4-6 oz (120-150 mL) of regular (not diet) soda.  ? 1 Tbsp (15 mL) honey or sugar.   Check your blood glucose 15 minutes after you take the carbohydrate.   If the repeat blood glucose level is still at or below 70 mg/dL (3.9 mmol/L), take 15 grams of a carbohydrate again.     is an emergency. Do not wait to see if the symptoms will go away. Get medical help right away. Call your local emergency services (911 in the [place] If you have severe hypoglycemia and you cannot eat or drink, you may need an injection of  glucagon. A family member or close friend should learn how to check your blood glucose and how to give you a glucagon injection. Ask your health care provider if you need to have an emergency glucagon injection kit available. Severe hypoglycemia may need to be treated in a hospital. The treatment may include getting glucose through an IV. You may also need treatment for the cause of your hypoglycemia. Follow these instructions at home: Take diabetes medicines as told  If your health care provider prescribed insulin or diabetes medicines, take them every day.  Do not run out of insulin or other diabetes medicines that you take. Plan ahead so you always have these available.  If you use insulin, adjust your dosage based on how physically active you are and what foods you eat. Your health care provider will tell you how to adjust your dosage. Make healthy food choices  The things that you eat and drink affect your blood glucose and your insulin dosage. Making good choices helps to control your diabetes and prevent other health problems. A healthy meal plan includes eating lean proteins, complex carbohydrates, fresh fruits and vegetables, low-fat dairy products, and healthy fats. Make an appointment to see a diet and nutrition specialist (registered dietitian) to help you create an eating plan that is right for you. Make sure that you:  Follow instructions from your health care provider about eating or drinking restrictions.  Drink enough fluid to keep your urine pale yellow.  Keep a record of the carbohydrates that you eat. Do this by reading food labels and learning the standard serving sizes of foods.  Follow your sick day plan whenever you cannot eat or drink as usual. Make this plan in advance with your health care provider.  Stay active Exercise regularly, as told by your health care provider. This may include:  Stretching and doing strength exercises, such as yoga or weightlifting, 2 or  more times a week.  Doing 150 minutes or more of moderate-intensity or vigorous-intensity exercise each week. This could be brisk walking, biking, or water aerobics. ? Spread out your activity over 3 or more days of the week. ? Do not go more than 2 days in a row without doing some kind of physical activity. When you start a new exercise or activity, work with your health care provider to adjust your insulin, medicines, or food intake as needed. Make healthy lifestyle choices  Do not use any tobacco products, such as cigarettes, chewing tobacco, and e-cigarettes. If you need help quitting, ask your health care provider.  If your health care provider says that alcohol is safe for you, limit alcohol intake to no more than 1 drink per day for nonpregnant women and 2 drinks per day for men. One drink equals 12 oz of beer (355 mL), 5 oz of wine (148 mL), or 1 oz of hard liquor (44 mL).  Learn to manage stress. If you need help with this, ask your health care provider. Care for your body   Keep your immunizations up to date. In addition to getting vaccinations as told by your health care provider, it is recommended that you get vaccinated against the following illnesses: ? The flu (influenza). Get a flu shot  every year. ? Pneumonia. ? Hepatitis B.  Schedule an eye exam soon after your diagnosis, and then one time every year after that.  Check your skin and feet every day for cuts, bruises, redness, blisters, or sores. Schedule a foot exam with your health care provider once every year.  Brush your teeth and gums two times a day, and floss one or more times a day. Visit your dentist one or more times every 6 months.  Maintain a healthy weight. General instructions  Take over-the-counter and prescription medicines only as told by your health care provider.  Share your diabetes management plan with people in your workplace, school, and household.  Carry a medical alert card or wear medical  alert jewelry.  Keep all follow-up visits as told by your health care provider. This is important. Questions to ask your health care provider  Do I need to meet with a diabetes educator?  Where can I find a support group for people with diabetes? Where to find more information For more information about diabetes, visit:  American Diabetes Association (ADA): www.diabetes.org  American Association of Diabetes Educators (AADE): www.diabeteseducator.org Summary  Caring for yourself after you have been diagnosed with (type 2 diabetes mellitus) means keeping your blood sugar (glucose) under control with a balance of nutrition, exercise, lifestyle changes, and medicine.  Check your blood glucose every day, as often as told by your health care provider.  Having diabetes can put you at risk for other long-term (chronic) conditions, such as heart disease and kidney disease. Your health care provider may prescribe medicines to help prevent complications from diabetes.  Keep all follow-up visits as told by your health care provider. This is important. This information is not intended to replace advice given to you by your health care provider. Make sure you discuss any questions you have with your health care provider. Document Released: 03/02/2016 Document Revised: 05/02/2018 Document Reviewed: 12/13/2015 Elsevier Patient Education  [place].  Dehydration, Adult  Dehydration is a condition in which there is not enough fluid or water in the body. This happens when you lose more fluids than you take in. Important organs, such as the kidneys, brain, and heart, cannot function without a proper amount of fluids. Any loss of fluids from the body can lead to dehydration. Dehydration can range from mild to severe. This condition should be treated right away to prevent it from becoming severe. What are the causes? This condition may be caused by:  Vomiting.  Diarrhea.  Excessive sweating,  such as from heat exposure or exercise.  Not drinking enough fluid, especially: ? When ill. ? While doing activity that requires a lot of energy.  Excessive urination.  Fever.  Infection.  Certain medicines, such as medicines that cause the body to lose excess fluid (diuretics).  Inability to access safe drinking water.  Reduced physical ability to get adequate water and food. What increases the risk? This condition is more likely to develop in people:  Who have a poorly controlled long-term (chronic) illness, such as diabetes, heart disease, or kidney disease.  Who are age 87 or older.  Who are disabled.  Who live in a place with high altitude.  Who play endurance sports. What are the signs or symptoms? Symptoms of mild dehydration may include:  Thirst.  Dry lips.  Slightly dry mouth.  Dry, warm skin.  Dizziness. Symptoms of moderate dehydration may include:  Very dry mouth.  Muscle cramps.  Dark urine. Urine may be  the color of tea.  Decreased urine production.  Decreased tear production.  Heartbeat that is irregular or faster than normal (palpitations).  Headache.  Light-headedness, especially when you stand up from a sitting position.  Fainting (syncope). Symptoms of severe dehydration may include:  Changes in skin, such as: ? Cold and clammy skin. ? Blotchy (mottled) or pale skin. ? Skin that does not quickly return to normal after being lightly pinched and released (poor skin turgor).  Changes in body fluids, such as: ? Extreme thirst. ? No tear production. ? Inability to sweat when body temperature is high, such as in hot weather. ? Very little urine production.  Changes in vital signs, such as: ? Weak pulse. ? Pulse that is more than 100 beats a minute when sitting still. ? Rapid breathing. ? Low blood pressure.  Other changes, such as: ? Sunken eyes. ? Cold hands and feet. ? Confusion. ? Lack of energy (lethargy). ?  Difficulty waking up from sleep. ? Short-term weight loss. ? Unconsciousness. How is this diagnosed? This condition is diagnosed based on your symptoms and a physical exam. Blood and urine tests may be done to help confirm the diagnosis. How is this treated? Treatment for this condition depends on the severity. Mild or moderate dehydration can often be treated at home. Treatment should be started right away. Do not wait until dehydration becomes severe. Severe dehydration is an emergency and it needs to be treated in a hospital. Treatment for mild dehydration may include:  Drinking more fluids.  Replacing salts and minerals in your blood (electrolytes) that you may have lost. Treatment for moderate dehydration may include:  Drinking an oral rehydration solution (ORS). This is a drink that helps you replace fluids and electrolytes (rehydrate). It can be found at pharmacies and retail stores. Treatment for severe dehydration may include:  Receiving fluids through an IV tube.  Receiving an electrolyte solution through a feeding tube that is passed through your nose and into your stomach (nasogastric tube, or NG tube).  Correcting any abnormalities in electrolytes.  Treating the underlying cause of dehydration. Follow these instructions at home:  If directed by your health care provider, drink an ORS: ? Make an ORS by following instructions on the package. ? Start by drinking small amounts, about  cup (120 mL) every 5-10 minutes. ? Slowly increase how much you drink until you have taken the amount recommended by your health care provider.  Drink enough clear fluid to keep your urine clear or pale yellow. If you were told to drink an ORS, finish the ORS first, then start slowly drinking other clear fluids. Drink fluids such as: ? Water. Do not drink only water. Doing that can lead to having too little salt (sodium) in the body (hyponatremia). ? Ice chips. ? Fruit juice that you have  added water to (diluted fruit juice). ? Low-calorie sports drinks.  Avoid: ? Alcohol. ? Drinks that contain a lot of sugar. These include high-calorie sports drinks, fruit juice that is not diluted, and soda. ? Caffeine. ? Foods that are greasy or contain a lot of fat or sugar.  Take over-the-counter and prescription medicines only as told by your health care provider.  Do not take sodium tablets. This can lead to having too much sodium in the body (hypernatremia).  Eat foods that contain a healthy balance of electrolytes, such as bananas, oranges, potatoes, tomatoes, and spinach.  Keep all follow-up visits as told by your health care provider. This is  important. Contact a health care provider if:  You have abdominal pain that: ? Gets worse. ? Stays in one area (localizes).  You have a rash.  You have a stiff neck.  You are more irritable than usual.  You are sleepier or more difficult to wake up than usual.  You feel weak or dizzy.  You feel very thirsty.  You have urinated only a small amount of very dark urine over 6-8 hours. Get help right away if:  You have symptoms of severe dehydration.  You cannot drink fluids without vomiting.  Your symptoms get worse with treatment.  You have a fever.  You have a severe headache.  You have vomiting or diarrhea that: ? Gets worse. ? Does not go away.  You have blood or green matter (bile) in your vomit.  You have blood in your stool. This may cause stool to look black and tarry.  You have not urinated in 6-8 hours.  You faint.  Your heart rate while sitting still is over 100 beats a minute.  You have trouble breathing. This information is not intended to replace advice given to you by your health care provider. Make sure you discuss any questions you have with your health care provider. Document Released: 11/09/2005 Document Revised: 10/22/2017 Document Reviewed: 01/03/2016 Elsevier Patient Education  2020  [employer].

## 2019-10-23 NOTE — Progress Notes (Signed)
Established Patient Office Visit  Subjective:  Patient ID: Terry Bonilla, male    DOB: [birth year]  Age: 48 y.o. MRN: [id number]  CC:  Chief Complaint  Patient presents with  . Diabetes    concerned about blood sugar levels   . Dizziness    on sunday pt felt dizzy and pased out & when to the hospital     HPI Terry Bonilla presents for establishment of care and follow-up of new onset diabetes.  Was seen in the emergency room for palpitations and lightheadedness back on the 22nd.  He had experienced a syncopal episode and fell and scraped his left knee and head.  Patient had felt lightheaded but denied a spinning sensation.  He had felt palpitations as well.  CT of the head taken at that time was normal.  Blood sugar was found to be 372.  Urinalysis showed specific gravity of 1.046.  He was started on Glucophage immediate release.  He returned back to the emergency department on the 25th again complaining of lightheadedness near syncope and palpitations.  He had experienced diarrhea with the immediate release Metformin with switch to the extended release.  Since that time he has been more tolerant of the extended release Metformin.  Taking 750 mg each morning.  Reports that he is had some visual changes as well as tingling in his toes.  He is not been feeling well for quite some time.  Older brother with diabetes.  Mom died 12 years ago in her late 57s from ovarian cancer.  She had a history of elevated cholesterol.  Dad is 19 years old.  Patient is still feeling lightheaded but feels somewhat better.  He has not seen a physician in some time now.  He has an eye appointment scheduled next week.  He requests to see an endocrinologist.  He is fasting this morning.  Past Medical History:  Diagnosis Date  . Diabetes mellitus without complication ([place])     History reviewed. No pertinent surgical history.  History reviewed. No pertinent family history.  Social History   Socioeconomic History   . Marital status: Unknown    Spouse name: Not on file  . Number of children: Not on file  . Years of education: Not on file  . Highest education level: Not on file  Occupational History  . Not on file  Social Needs  . Financial resource strain: Not on file  . Food insecurity    Worry: Not on file    Inability: Not on file  . Transportation needs    Medical: Not on file    Non-medical: Not on file  Tobacco Use  . Smoking status: Never Smoker  . Smokeless tobacco: Never Used  Substance and Sexual Activity  . Alcohol use: Yes  . Drug use: Never  . Sexual activity: Not on file  Lifestyle  . Physical activity    Days per week: Not on file    Minutes per session: Not on file  . Stress: Not on file  Relationships  . Social Herbalist on phone: Not on file    Gets together: Not on file    Attends religious service: Not on file    Active member of club or organization: Not on file    Attends meetings of clubs or organizations: Not on file    Relationship status: Not on file  . Intimate partner violence    Fear of current or ex partner: Not  on file    Emotionally abused: Not on file    Physically abused: Not on file    Forced sexual activity: Not on file  Other Topics Concern  . Not on file  Social History Narrative  . Not on file    Outpatient Medications Prior to Visit  Medication Sig Dispense Refill  . betamethasone dipropionate 0.05 % cream Apply 1 application topically as needed.    . finasteride (PROPECIA) 1 MG tablet Take 1 tablet by mouth daily.    . metFORMIN (GLUCOPHAGE-XR) 750 MG 24 hr tablet Take 1 tablet (750 mg total) by mouth daily with breakfast. 30 tablet 0   No facility-administered medications prior to visit.     Allergies  Allergen Reactions  . Crab [Shellfish Allergy] Hives  . Sulfa Antibiotics Hives  . Sulfur Rash    ROS Review of Systems  Constitutional: Positive for fatigue. Negative for chills, diaphoresis, fever and unexpected  weight change.  HENT: Negative.   Eyes: Negative for photophobia and visual disturbance.  Respiratory: Negative.  Negative for chest tightness and shortness of breath.   Cardiovascular: Positive for palpitations. Negative for chest pain.  Gastrointestinal: Negative for abdominal pain, nausea and vomiting.  Endocrine: Positive for polyuria. Negative for polyphagia.  Genitourinary: Positive for frequency. Negative for difficulty urinating, dysuria and urgency.  Musculoskeletal: Negative for arthralgias and myalgias.  Skin: Negative for pallor.  Allergic/Immunologic: Negative for immunocompromised state.  Neurological: Positive for syncope, light-headedness and numbness. Negative for dizziness, seizures, facial asymmetry, speech difficulty and headaches.  Hematological: Does not bruise/bleed easily.  Psychiatric/Behavioral: Negative.       Objective:    Physical Exam  Constitutional: He is oriented to person, place, and time. He appears well-developed and well-nourished. No distress.  HENT:  Head: Normocephalic and atraumatic.  Right Ear: External ear normal.  Left Ear: External ear normal.  Mouth/Throat: Oropharynx is clear and moist. No oropharyngeal exudate.  Eyes: Pupils are equal, round, and reactive to light. Conjunctivae are normal. Right eye exhibits no discharge. Left eye exhibits no discharge. No scleral icterus.  Neck: Neck supple. No JVD present. No tracheal deviation present. No thyromegaly present.  Cardiovascular: Normal rate, regular rhythm and normal heart sounds.  Pulmonary/Chest: Effort normal and breath sounds normal. No stridor.  Abdominal: Bowel sounds are normal.  Lymphadenopathy:    He has no cervical adenopathy.  Neurological: He is alert and oriented to person, place, and time.  Skin: Skin is warm and dry. He is not diaphoretic. No pallor.     Psychiatric: He has a normal mood and affect. His behavior is normal.    BP 124/72   Pulse 93   Temp 97.8 F  (36.6 C)   Ht '6\' 3"'  (1.905 m)   SpO2 100%   BMI 33.12 kg/m  Wt Readings from Last 3 Encounters:  10/18/19 265 lb (120.2 kg)   BP Readings from Last 3 Encounters:  10/23/19 124/72  10/18/19 (!) 138/95  10/15/19 (!) 168/97   Guideline developer:  UpToDate (see UpToDate for funding source) Date Released: June 2014  Health Maintenance Due  Topic Date Due  . HEMOGLOBIN A1C  [birth year]1969  . PNEUMOCOCCAL POLYSACCHARIDE VACCINE AGE 55-64 HIGH RISK  03/25/1971  . FOOT EXAM  03/25/1979  . OPHTHALMOLOGY EXAM  03/25/1979  . URINE MICROALBUMIN  03/25/1979  . HIV Screening  03/24/1984  . COLONOSCOPY  03/25/2019    There are no preventive care reminders to display for this patient.  No results found for:  TSH Lab Results  Component Value Date   WBC 5.5 10/18/2019   HGB 15.9 10/18/2019   HCT 46.5 10/18/2019   MCV 91.0 10/18/2019   PLT 167 10/18/2019   Lab Results  Component Value Date   NA 137 10/18/2019   K 3.9 10/18/2019   CO2 25 10/18/2019   GLUCOSE 197 (H) 10/18/2019   BUN 12 10/18/2019   CREATININE 0.68 10/18/2019   BILITOT 1.2 10/18/2019   ALKPHOS 70 10/18/2019   AST 34 10/18/2019   ALT 51 (H) 10/18/2019   PROT 7.1 10/18/2019   ALBUMIN 4.6 10/18/2019   CALCIUM 9.1 10/18/2019   ANIONGAP 9 10/18/2019   No results found for: CHOL No results found for: HDL No results found for: LDLCALC No results found for: TRIG No results found for: CHOLHDL No results found for: HGBA1C    Assessment & Plan:   Problem List Items Addressed This Visit      Endocrine   Type 2 diabetes mellitus with diabetic neuropathy, without long-term current use of insulin ([place]) - Primary   Relevant Orders   CBC   Comp Met (CMET)   HgB A1c   Urinalysis, Routine w reflex microscopic   Urine Microalbumin w/creat. ratio   Ambulatory referral to Endocrinology   Ambulatory referral to diabetic education     Musculoskeletal and Integument   Atypical nevus     Other   Healthcare maintenance    Relevant Orders   Direct LDL   Lipid Profile   PSA   Need for influenza vaccination   Relevant Orders   Flu Vaccine QUAD 6+ mos PF IM (Fluarix Quad PF) (Completed)      No orders of the defined types were placed in this encounter.   Follow-up: Return in about 1 week (around 10/30/2019).    Patient will continue the Metformin extended release 750 mg each morning for this week.  We will follow-up in 1 week and will try to increase it to twice daily.  Stressed the importance of rehydration.  He will avoid in particular sweets he drinks and sweets and caffeine.  He requests endocrinology follow-up.  Patient was given information on type 2 diabetes and dehydration.  Spent over 40 minutes with this patient with more than 50% of the time spent in counseling.  There was no significant drop in blood pressure or increase in pulse rate from lying to sitting.

## 2019-10-25 ENCOUNTER — Other Ambulatory Visit: Payer: Self-pay

## 2019-10-25 MED ORDER — TRUE METRIX BLOOD GLUCOSE TEST VI STRP: ORAL_STRIP | 3 refills | Status: DC

## 2019-10-25 MED ORDER — TRUEPLUS LANCETS 30G MISC: 3 refills | Status: DC

## 2019-10-30 ENCOUNTER — Encounter: Payer: Self-pay | Admitting: Family Medicine

## 2019-10-30 ENCOUNTER — Ambulatory Visit (INDEPENDENT_AMBULATORY_CARE_PROVIDER_SITE_OTHER): Payer: BC Managed Care – PPO | Admitting: Family Medicine

## 2019-10-30 VITALS — Ht 75.0 in | Wt 257.0 lb

## 2019-10-30 DIAGNOSIS — Z7289 Other problems related to lifestyle: Secondary | ICD-10-CM

## 2019-10-30 DIAGNOSIS — R7989 Other specified abnormal findings of blood chemistry: Secondary | ICD-10-CM | POA: Diagnosis not present

## 2019-10-30 DIAGNOSIS — R002 Palpitations: Secondary | ICD-10-CM | POA: Diagnosis not present

## 2019-10-30 DIAGNOSIS — Z789 Other specified health status: Secondary | ICD-10-CM

## 2019-10-30 DIAGNOSIS — H8309 Labyrinthitis, unspecified ear: Secondary | ICD-10-CM

## 2019-10-30 MED ORDER — MECLIZINE HCL 25 MG PO TABS: 25.0000 mg | ORAL_TABLET | Freq: Three times a day (TID) | ORAL | 0 refills | Status: DC | PRN

## 2019-10-30 NOTE — Progress Notes (Signed)
Established Patient Office Visit  Subjective:  Patient ID: Terry Bonilla, male    DOB: [birth year]  Age: 48 y.o. MRN: [id number]  CC:  Chief Complaint  Patient presents with  . 1 week follow up visit    Glucose checked 144 before breakfast around 8:55am     HPI Terry Bonilla presents for follow-up of his diabetes.  Blood sugars have been heading [place] which is good.  He has been tolerating the Glucophage.  Requested appointment with endocrinology as tomorrow.  He continues to have lightheadedness.  There is not much spinning or the sensation of movement but he has difficulty standing up without having to sit down promptly.  He has had a brief sensation of movement when he is rolled over in bed.  We discussed his lipid profile.  It is not ideal but needs improvement.  His triglycerides are low.  Discussed his elevated liver functions.  He does admit that he has needed to cut back on his alcohol usage.    Past Medical History:  Diagnosis Date  . Diabetes mellitus without complication ([place])     No past surgical history on file.  No family history on file.  Social History   Socioeconomic History  . Marital status: Unknown    Spouse name: Not on file  . Number of children: Not on file  . Years of education: Not on file  . Highest education level: Not on file  Occupational History  . Not on file  Social Needs  . Financial resource strain: Not on file  . Food insecurity    Worry: Not on file    Inability: Not on file  . Transportation needs    Medical: Not on file    Non-medical: Not on file  Tobacco Use  . Smoking status: Never Smoker  . Smokeless tobacco: Never Used  Substance and Sexual Activity  . Alcohol use: Yes  . Drug use: Never  . Sexual activity: Not on file  Lifestyle  . Physical activity    Days per week: Not on file    Minutes per session: Not on file  . Stress: Not on file  Relationships  . Social Herbalist on phone: Not on file    Gets  together: Not on file    Attends religious service: Not on file    Active member of club or organization: Not on file    Attends meetings of clubs or organizations: Not on file    Relationship status: Not on file  . Intimate partner violence    Fear of current or ex partner: Not on file    Emotionally abused: Not on file    Physically abused: Not on file    Forced sexual activity: Not on file  Other Topics Concern  . Not on file  Social History Narrative  . Not on file    Outpatient Medications Prior to Visit  Medication Sig Dispense Refill  . finasteride (PROPECIA) 1 MG tablet Take 1 tablet by mouth daily.    Marland Kitchen glucose blood (TRUE METRIX BLOOD GLUCOSE TEST) test strip Use to test blood sugars 1-2 times daily. 100 each 3  . metFORMIN (GLUCOPHAGE-XR) 750 MG 24 hr tablet Take 1 tablet (750 mg total) by mouth daily with breakfast. 30 tablet 0  . TRUEplus Lancets 30G MISC Use to test blood sugars 1-2 times daily. 100 each 3  . betamethasone dipropionate 0.05 % cream Apply 1 application topically as needed.  No facility-administered medications prior to visit.     Allergies  Allergen Reactions  . Crab [Shellfish Allergy] Hives  . Sulfa Antibiotics Hives  . Sulfur Rash    ROS Review of Systems  Constitutional: Negative for chills, diaphoresis, fatigue, fever and unexpected weight change.  HENT: Negative.   Eyes: Negative for photophobia and visual disturbance.  Respiratory: Negative.  Negative for chest tightness and shortness of breath.   Cardiovascular: Positive for palpitations. Negative for chest pain.  Gastrointestinal: Negative.   Endocrine: Negative for polyphagia and polyuria.  Genitourinary: Negative for difficulty urinating, frequency and testicular pain.  Neurological: Positive for dizziness and light-headedness.  Hematological: Does not bruise/bleed easily.  Psychiatric/Behavioral: Negative.       Objective:    Physical Exam  Ht 6\' 3"  (1.905 m)   Wt 257  lb (116.6 kg)   BMI 32.12 kg/m  Wt Readings from Last 3 Encounters:  10/30/19 257 lb (116.6 kg)  10/18/19 265 lb (120.2 kg)     Health Maintenance Due  Topic Date Due  . PNEUMOCOCCAL POLYSACCHARIDE VACCINE AGE 29-64 HIGH RISK  03/25/1971  . FOOT EXAM  03/25/1979  . OPHTHALMOLOGY EXAM  03/25/1979  . HIV Screening  03/24/1984  . COLONOSCOPY  03/25/2019    There are no preventive care reminders to display for this patient.  No results found for: TSH Lab Results  Component Value Date   WBC 4.2 10/23/2019   HGB 16.8 10/23/2019   HCT 47.4 10/23/2019   MCV 90.7 10/23/2019   PLT 179.0 10/23/2019   Lab Results  Component Value Date   NA 135 10/23/2019   K 3.7 10/23/2019   CO2 22 10/23/2019   GLUCOSE 176 (H) 10/23/2019   BUN 18 10/23/2019   CREATININE 0.66 10/23/2019   BILITOT 0.9 10/23/2019   ALKPHOS 69 10/23/2019   AST 45 (H) 10/23/2019   ALT 76 (H) 10/23/2019   PROT 6.5 10/23/2019   ALBUMIN 4.2 10/23/2019   CALCIUM 9.1 10/23/2019   ANIONGAP 9 10/18/2019   GFR 127.46 10/23/2019   Lab Results  Component Value Date   CHOL 160 10/23/2019   Lab Results  Component Value Date   HDL 27.30 (L) 10/23/2019   Lab Results  Component Value Date   LDLCALC 113 (H) 10/23/2019   Lab Results  Component Value Date   TRIG 97.0 10/23/2019   Lab Results  Component Value Date   CHOLHDL 6 10/23/2019   Lab Results  Component Value Date   HGBA1C 10.0 (H) 10/23/2019      Assessment & Plan:   Problem List Items Addressed This Visit      Other   Palpitations - Primary   Relevant Orders   Ambulatory referral to Cardiology    Other Visit Diagnoses    Elevated LFTs       Relevant Orders   US Abdomen Limited RUQ   Alcohol use       Labyrinthitis, unspecified laterality       Relevant Medications   meclizine (ANTIVERT) 25 MG tablet      Meds ordered this encounter  Medications  . meclizine (ANTIVERT) 25 MG tablet    Sig: Take 1 tablet (25 mg total) by mouth 3  (three) times daily as needed for dizziness.    Dispense:  30 tablet    Refill:  0    Follow-up: Return in about 2 weeks (around 11/13/2019).   We discussed alcohol and its impact on diabetes and his weight.  Will  try Antivert to see if that will help.  He will check and record his blood pressures when he feels lightheaded again.    Libby Maw, MD

## 2019-10-31 ENCOUNTER — Other Ambulatory Visit: Payer: Self-pay

## 2019-10-31 ENCOUNTER — Encounter: Payer: Self-pay | Admitting: Internal Medicine

## 2019-10-31 ENCOUNTER — Ambulatory Visit (INDEPENDENT_AMBULATORY_CARE_PROVIDER_SITE_OTHER): Payer: BC Managed Care – PPO | Admitting: Internal Medicine

## 2019-10-31 DIAGNOSIS — E1140 Type 2 diabetes mellitus with diabetic neuropathy, unspecified: Secondary | ICD-10-CM | POA: Diagnosis not present

## 2019-10-31 MED ORDER — METFORMIN HCL ER 750 MG PO TB24: ORAL_TABLET | ORAL | 3 refills | Status: DC

## 2019-10-31 NOTE — Progress Notes (Signed)
Patient ID: BLACE LEPRE, male   DOB: 1971-08-15, 48 y.o.   MRN: [id number]   Patient location: Home My location: Office Persons participating in the virtual visit: patient, provider  Referring Provider: Libby Maw, MD  I connected with the patient on 10/31/19 at  1:23 PM EST by a video enabled telemedicine application and verified that I am speaking with the correct person.   I discussed the limitations of evaluation and management by telemedicine and the availability of in person appointments. The patient expressed understanding and agreed to proceed.   Details of the encounter are shown below.  HPI: ANURAG LAWTON is a 48 y.o.-year-old male, referred by his PCP, Dr. Ethelene Hal, for management of DM2, dx on 10/15/2019, non-insulin-dependent, uncontrolled, with long-term complications (peripheral neuropathy).  He was recently diagnosed with diabetes in 09/2019 after he presented to the ED with a syncopal episode.  In the ED, glucose was 372 and he had glycosuria on urinalysis.  His HbA1c was 10%.  He was started on Metformin.  Reviewed latest HbA1c level: Lab Results  Component Value Date   HGBA1C 10.0 (H) 10/23/2019   Pt is on a regimen of: - Metformin ER 750 mg 1x a day, with b'fast (had palpitations with the IR)  Pt checks his sugars 2x a day and they are: - am: 145-172, 207 - 2h after b'fast: n/c - before lunch: n/c - 2h after lunch: n/c - before dinner: n/c - 2h after dinner: n/c - bedtime: 130, 141-169, 209 - nighttime: n/c Lowest sugar was 130; ? hypoglycemia awareness. Highest sugar was 209.  Glucometer: [employer] brand  Pt's meals are better after his dx: - Breakfast: [place] links - Lunch: chicken and broccoli - pm: nuts - Dinner: chisken - Snacks: whole wheat He was drinking sweet drinks, sweet tea >> stopped.  - no CKD, last BUN/creatinine:  Lab Results  Component Value Date   BUN 18 10/23/2019   BUN 12 10/18/2019   CREATININE 0.66 10/23/2019    CREATININE 0.68 10/18/2019    - +HL; last set of lipids: Lab Results  Component Value Date   CHOL 160 10/23/2019   HDL 27.30 (L) 10/23/2019   LDLCALC 113 (H) 10/23/2019   LDLDIRECT 118.0 10/23/2019   TRIG 97.0 10/23/2019   CHOLHDL 6 10/23/2019   - last eye exam was in 2017. No DR. Scheduled 11/15/2019.  - no numbness and tingling in his feet.  Pt has FH of DM in elder brother.  He also has a history of alcohol use and transaminitis. He has dizziness >> started Meclizine yesterday and his dizziness has improved.  ROS: Constitutional: no weight gain, + weight loss (10 lbs in last week), no fatigue, no subjective hyperthermia, no subjective hypothermia, no nocturia Eyes: no blurry vision, no xerophthalmia ENT: no sore throat, no nodules palpated in neck, no dysphagia, no odynophagia, no hoarseness, no tinnitus, no hypoacusis Cardiovascular: no CP, no SOB, no palpitations, no leg swelling Respiratory: no cough, no SOB, no wheezing Gastrointestinal: no N, no V, no D, no C, no acid reflux Musculoskeletal: no muscle, no joint aches Skin: no rash, no hair loss Neurological: no tremors, no numbness or tingling/no dizziness/no HAs, + lightheadedness Psychiatric: no depression, no anxiety  Past Medical History:  Diagnosis Date  . Diabetes mellitus without complication ([place])    History reviewed. No pertinent surgical history. Social History   Socioeconomic History  . Marital status: Unknown    Spouse name: Not on file  . Number  of children: Not on file  . Years of education: Not on file  . Highest education level: Not on file  Occupational History  . Not on file  Social Needs  . Financial resource strain: Not on file  . Food insecurity    Worry: Not on file    Inability: Not on file  . Transportation needs    Medical: Not on file    Non-medical: Not on file  Tobacco Use  . Smoking status: Never Smoker  . Smokeless tobacco: Never Used  Substance and Sexual Activity  .  Alcohol use: Yes  . Drug use: Never  . Sexual activity: Not on file  Lifestyle  . Physical activity    Days per week: Not on file    Minutes per session: Not on file  . Stress: Not on file  Relationships  . Social Herbalist on phone: Not on file    Gets together: Not on file    Attends religious service: Not on file    Active member of club or organization: Not on file    Attends meetings of clubs or organizations: Not on file    Relationship status: Not on file  . Intimate partner violence    Fear of current or ex partner: Not on file    Emotionally abused: Not on file    Physically abused: Not on file    Forced sexual activity: Not on file  Other Topics Concern  . Not on file  Social History Narrative  . Not on file   Current Outpatient Medications on File Prior to Visit  Medication Sig Dispense Refill  . betamethasone dipropionate 0.05 % cream Apply 1 application topically as needed.    . finasteride (PROPECIA) 1 MG tablet Take 1 tablet by mouth daily.    Marland Kitchen glucose blood (TRUE METRIX BLOOD GLUCOSE TEST) test strip Use to test blood sugars 1-2 times daily. 100 each 3  . meclizine (ANTIVERT) 25 MG tablet Take 1 tablet (25 mg total) by mouth 3 (three) times daily as needed for dizziness. 30 tablet 0  . metFORMIN (GLUCOPHAGE-XR) 750 MG 24 hr tablet Take 1 tablet (750 mg total) by mouth daily with breakfast. 30 tablet 0  . TRUEplus Lancets 30G MISC Use to test blood sugars 1-2 times daily. 100 each 3   No current facility-administered medications on file prior to visit.    Allergies  Allergen Reactions  . Crab [Shellfish Allergy] Hives  . Sulfa Antibiotics Hives  . Sulfur Rash   PMH: -Pertinent PMH reviewed in HPI  PE: Today BP: 122/72 There were no vitals taken for this visit. Wt Readings from Last 3 Encounters:  10/30/19 257 lb (116.6 kg)  10/18/19 265 lb (120.2 kg)   Constitutional:  in NAD  The physical exam was not performed (virtual  visit).  ASSESSMENT: 1. DM2, non-insulin-dependent, uncontrolled, with complications - PN  PLAN:  1. Patient with recently diagnosed, uncontrolled diabetes.  He was found to have a very high blood sugar after a syncopal episode last month.  His HbA1c was also very high, at 10%.  He has no history of diabetes or prediabetes.  He is on oral antidiabetic regimen with low-dose Metformin ER.  He initially was started on Metformin IR which she could not tolerate due to palpitations.  However, he tolerates the Metformin ER very well. -At this visit, we reviewed his blood sugars at home and they have decreased significantly, from an average of approximately  240 (judging by his HbA1c) to 150-170.  He mentions that he made changes in his diet since his diagnosis, mainly eliminated sweet drinks and improving the quality of his meals.  I strongly advised him to continue.  He has an appointment with nutrition in January. -At this visit, we discussed about further treatment.  For now, I would suggest to increase the Metformin dose to 1500 mg daily, but I do not feel he needs any other changes, provided that he continues with dietary changes.  I advised him to take the entire metformin dose with dinner, but if he cannot tolerate it, to split the dose into, with breakfast and dinner.  We discussed about the Metformin mechanism of action.  His sugars are higher in the morning and I am hoping that increasing Metformin will decrease hepatic gluconeogenesis and will allow his a.m. sugars to improve. - I suggested to:  Patient Instructions  Please increase: - Metformin ER to 1500 mg with dinner  Please let me know if the sugars are consistently <80 or >200.  Please return in 2 months with your sugar log.   - Strongly advised him to start checking sugars at different times of the day - check 1-2x a day, rotating checks - discussed about CBG targets for treatment: 80-130 mg/dL before meals and <180 mg/dL after meals;  target HbA1c <7%. - given sugar log and advised how to fill it and to bring it at next appt  - given foot care handout and explained the principles  - given instructions for hypoglycemia management "15-15 rule"  - advised for yearly eye exams  - Return to clinic in 2 mo with sugar log   Philemon Kingdom, MD PhD [facility]

## 2019-10-31 NOTE — Patient Instructions (Addendum)
Please increase: - Metformin ER to 1500 mg with dinner  Please let me know if the sugars are consistently <80 or >200.  Please return in 2 months with your sugar log.   PATIENT INSTRUCTIONS FOR TYPE 2 DIABETES:  **Please join MyChart!** - see attached instructions about how to join if you have not done so already.  DIET AND EXERCISE Diet and exercise is an important part of diabetic treatment.  We recommended aerobic exercise in the form of brisk walking (working between 40-60% of maximal aerobic capacity, similar to brisk walking) for 150 minutes per week (such as 30 minutes five days per week) along with 3 times per week performing 'resistance' training (using various gauge rubber tubes with handles) 5-10 exercises involving the major muscle groups (upper body, lower body and core) performing 10-15 repetitions (or near fatigue) each exercise. Start at half the above goal but build slowly to reach the above goals. If limited by weight, joint pain, or disability, we recommend daily walking in a swimming pool with water up to waist to reduce pressure from joints while allow for adequate exercise.    BLOOD GLUCOSES Monitoring your blood glucoses is important for continued management of your diabetes. Please check your blood glucoses 2-4 times a day: fasting, before meals and at bedtime (you can rotate these measurements - e.g. one day check before the 3 meals, the next day check before 2 of the meals and before bedtime, etc.).   HYPOGLYCEMIA (low blood sugar) Hypoglycemia is usually a reaction to not eating, exercising, or taking too much insulin/ other diabetes drugs.  Symptoms include tremors, sweating, hunger, confusion, headache, etc. Treat IMMEDIATELY with 15 grams of Carbs: . 4 glucose tablets .  cup regular juice/soda . 2 tablespoons raisins . 4 teaspoons sugar . 1 tablespoon honey Recheck blood glucose in 15 mins and repeat above if still symptomatic/blood glucose  <100.  RECOMMENDATIONS TO REDUCE YOUR RISK OF DIABETIC COMPLICATIONS: * Take your prescribed MEDICATION(S) * Follow a DIABETIC diet: Complex carbs, fiber rich foods, (monounsaturated and polyunsaturated) fats * AVOID saturated/trans fats, high fat foods, >2,300 mg salt per day. * EXERCISE at least 5 times a week for 30 minutes or preferably daily.  * DO NOT SMOKE OR DRINK more than 1 drink a day. * Check your FEET every day. Do not wear tightfitting shoes. Contact us if you develop an ulcer * See your EYE doctor once a year or more if needed * Get a FLU shot once a year * Get a PNEUMONIA vaccine once before and once after age 57 years  GOALS:  * Your Hemoglobin A1c of <7%  * fasting sugars need to be <130 * after meals sugars need to be <180 (2h after you start eating) * Your Systolic BP should be [id number] or lower  * Your Diastolic BP should be 80 or lower  * Your HDL (Good Cholesterol) should be 40 or higher  * Your LDL (Bad Cholesterol) should be 100 or lower. * Your Triglycerides should be 150 or lower  * Your Urine microalbumin (kidney function) should be <30 * Your Body Mass Index should be 25 or lower    Please consider the following ways to cut down carbs and fat and increase fiber and micronutrients in your diet: - substitute whole grain for white bread or pasta - substitute brown rice for white rice - substitute 90-calorie flat bread pieces for slices of bread when possible - substitute sweet potatoes or yams for white  potatoes - substitute humus for margarine - substitute tofu for cheese when possible - substitute almond or rice milk for regular milk (would not drink soy milk daily due to concern for soy estrogen influence on breast cancer risk) - substitute dark chocolate for other sweets when possible - substitute water - can add lemon or orange slices for taste - for diet sodas (artificial sweeteners will trick your body that you can eat sweets without getting calories and  will lead you to overeating and weight gain in the long run) - do not skip breakfast or other meals (this will slow down the metabolism and will result in more weight gain over time)  - can try smoothies made from fruit and almond/rice milk in am instead of regular breakfast - can also try old-fashioned (not instant) oatmeal made with almond/rice milk in am - order the dressing on the side when eating salad at a restaurant (pour less than half of the dressing on the salad) - eat as little meat as possible - can try juicing, but should not forget that juicing will get rid of the fiber, so would alternate with eating raw veg./fruits or drinking smoothies - use as little oil as possible, even when using olive oil - can dress a salad with a mix of balsamic vinegar and lemon juice, for e.g. - use agave nectar, stevia sugar, or regular sugar rather than artificial sweateners - steam or broil/roast veggies  - snack on veggies/fruit/nuts (unsalted, preferably) when possible, rather than processed foods - reduce or eliminate aspartame in diet (it is in diet sodas, chewing gum, etc) Read the labels!  Try to read Dr. Janene Harvey book: "Program for Reversing Diabetes" for other ideas for healthy eating.

## 2019-11-03 ENCOUNTER — Telehealth: Payer: Self-pay | Admitting: Family Medicine

## 2019-11-03 NOTE — Telephone Encounter (Signed)
Pt called and stated that he had an appointment on 10/30/19 and stated that he has not developed a cough and would like to know if something could be called in. Please advise

## 2019-11-09 ENCOUNTER — Other Ambulatory Visit: Payer: BC Managed Care – PPO

## 2019-11-14 ENCOUNTER — Ambulatory Visit: Payer: BC Managed Care – PPO | Admitting: Registered"

## 2019-11-14 ENCOUNTER — Telehealth: Payer: Self-pay | Admitting: Internal Medicine

## 2019-11-14 NOTE — Telephone Encounter (Signed)
Notified patient of message from Dr. Gherghe, patient expressed understanding and agreement. No further questions.  

## 2019-11-14 NOTE — Telephone Encounter (Signed)
Melissa, can you please call him: We increased his Metformin dose at last visit to 2 tablets at night.  If he is having these symptoms, let us go ahead and change Metformin dose to 1 tablet twice a day, with breakfast and dinner.

## 2019-11-14 NOTE — Telephone Encounter (Signed)
Patient called to advise that he is having lingering light headedness and upset stomach.  Would like advice on symptoms and possible solutions?  Call back number for clarification is [phone]

## 2019-11-21 DIAGNOSIS — G4733 Obstructive sleep apnea (adult) (pediatric): Secondary | ICD-10-CM | POA: Diagnosis not present

## 2019-11-22 NOTE — Telephone Encounter (Signed)
M, I am glad that he is GI symptoms are improving.  His symptoms appear to be related to dehydration.  Please advise him to try to stay well-hydrated.  If the lightheadedness persists, he may need to see his PCP for this.

## 2019-11-22 NOTE — Telephone Encounter (Signed)
**  update 11/22/2019 Patient called back to advise that he still experiencing lingering light headedness - tends to notice that it is happening a lot upon rising and moving about.  Blood sugars are better and stomach upset is improving.    Would like advice on continuing symptoms and possible solutions?  Call back number for clarification is [phone]

## 2019-11-22 NOTE — Telephone Encounter (Signed)
Notified patient of message from Dr. Cruzita Lederer, patient expressed understanding and agreement. No further questions.  Patient will increase fluid intake and he does have appt with PCP next week.

## 2019-11-27 ENCOUNTER — Encounter: Payer: BC Managed Care – PPO | Attending: Family Medicine | Admitting: Registered"

## 2019-11-27 ENCOUNTER — Encounter: Payer: Self-pay | Admitting: Cardiology

## 2019-11-27 ENCOUNTER — Other Ambulatory Visit: Payer: Self-pay

## 2019-11-27 ENCOUNTER — Ambulatory Visit (INDEPENDENT_AMBULATORY_CARE_PROVIDER_SITE_OTHER): Payer: BC Managed Care – PPO | Admitting: Cardiology

## 2019-11-27 ENCOUNTER — Encounter: Payer: Self-pay | Admitting: Registered"

## 2019-11-27 VITALS — BP 123/88 | HR 108 | Ht 75.0 in | Wt 254.0 lb

## 2019-11-27 DIAGNOSIS — R002 Palpitations: Secondary | ICD-10-CM

## 2019-11-27 DIAGNOSIS — R55 Syncope and collapse: Secondary | ICD-10-CM | POA: Diagnosis not present

## 2019-11-27 DIAGNOSIS — R42 Dizziness and giddiness: Secondary | ICD-10-CM

## 2019-11-27 DIAGNOSIS — R000 Tachycardia, unspecified: Secondary | ICD-10-CM

## 2019-11-27 DIAGNOSIS — Z7189 Other specified counseling: Secondary | ICD-10-CM

## 2019-11-27 DIAGNOSIS — E1140 Type 2 diabetes mellitus with diabetic neuropathy, unspecified: Secondary | ICD-10-CM | POA: Insufficient documentation

## 2019-11-27 NOTE — Progress Notes (Signed)
Cardiology Office Note:    Date:  11/27/2019   ID:  Terry Bonilla, DOB 1970-12-05, MRN [id number]  PCP:  Terry Maw, MD  Cardiologist:  Buford Dresser, MD  Referring MD: Terry Bonilla,*   CC: new patient evaluation for palpitations, syncope, lightheadedness.   History of Present Illness:    Terry Bonilla is a 49 y.o. male with a hx of type II diabetes, not on insulin who is seen as a new consult at the request of Terry Bonilla,* for the evaluation and management of palpitations and lightheadedness.  Lightheadedness/palpitations: Initial onset: palpitations initially started when walking up the stadium steps at [facility] [address]. Felt lightheaded, like heart was racing. Happened about 1 year ago, occurred twice then no further issues.   On 10/15/19, got up and went to lie down on the bed. Noted that the room was spinning, then passed out. No other prodrome (no chest pain, etc) beyond the lightheadedness. Hit his head and skinned his knee. Went to [facility] for evaluation (see notes). Blood sugar was significantly elevated, diagnosed with type II diabetes and started on metformin. Felt poorly, started having palpitations, returned to ER 10/18/19. Once he was changed to long acting metformin, he had no further palpations.  Lightheadedness has remained an issue, though no longer vertigo. Has family members helping with groceries, etc because he feels concerned that he may have issues at any time. No more syncope since initial event. Lightheadedness has slowly gotten better; ok sitting or lying down, but feels poorly when he moves around. Feels "off" or "cloudy." Tried meclizine, but actually made his symptoms worse. No other clear aggravating/alleviating factors. Had one episode of presyncope about 4 years ago, but no other complete syncope.  Blood sugar numbers today show he has been largely 90s-110s since starting extended release metformin. Eating well, lost 17 lbs in 5  weeks.   -Prior cardiac history: none -Prior ECG: NSR or sinus tach -Prior workup: none -Prior treatment: none -Possible medication interactions: possibly related to immediate release metformin -Caffeine: occasional, now drinks only water or unsweet tea. Cut out soft drinks (1-2/day prior) and alcohol (1 beer every other night during the week, several drinks/time on the weekend). -Tobacco: none -OTC supplements: none -Comorbidities: type II diabetes -Exercise level: no intentional -Labs: kidney function/electrolytes, CBC reviewed. -Cardiac ROS: no chest pain, no shortness of breath, no PND, no orthopnea, no LE edema. -Family history: father has had 3 or 4 stents placed. Mother had valve replacement in her 36s, passed away age 75 from ovarian cancer. No stroke that he knows of. Oldest brother has type II diabetes as well.   A1c 10/23/19 10.0, lipids with LDL 113, TG 97  Past Medical History:  Diagnosis Date  . Diabetes mellitus without complication ([place])     History reviewed. No pertinent surgical history.  Current Medications: Current Outpatient Medications on File Prior to Visit  Medication Sig  . betamethasone dipropionate 0.05 % cream Apply 1 application topically as needed.  . finasteride (PROPECIA) 1 MG tablet Take 1 tablet by mouth daily.  Marland Kitchen glucose blood (TRUE METRIX BLOOD GLUCOSE TEST) test strip Use to test blood sugars 1-2 times daily.  . meclizine (ANTIVERT) 25 MG tablet Take 1 tablet (25 mg total) by mouth 3 (three) times daily as needed for dizziness.  . metFORMIN (GLUCOPHAGE-XR) 750 MG 24 hr tablet Take 1500 mg by mouth with dinner (Patient taking differently: Take 750 mg by mouth. Take 1 tablet with breakfast and 1  tablet with dinner.)  . TRUEplus Lancets 30G MISC Use to test blood sugars 1-2 times daily.   No current facility-administered medications on file prior to visit.     Allergies:   Crab [shellfish allergy], Sulfa antibiotics, and Sulfur   Social  History   Tobacco Use  . Smoking status: Never Smoker  . Smokeless tobacco: Never Used  Substance Use Topics  . Alcohol use: Yes  . Drug use: Never    Family History: As reviewed in HPI  ROS:   Please see the history of present illness.  Additional pertinent ROS: Constitutional: Negative for chills, night sweats, unintentional weight loss. Had fever for 3 days about 2.5 weeks ago. HENT: Negative for ear pain and hearing loss.   Eyes: Negative for loss of vision and eye pain.  Respiratory: Negative for sputum, wheezing.  Positive for dry cough for the last week, only when talking. Cardiovascular: See HPI. Gastrointestinal: Negative for abdominal pain, melena, and hematochezia.  Genitourinary: Negative for dysuria and hematuria.  Musculoskeletal: Negative for falls and myalgias.  Skin: Negative for itching and rash.  Neurological: Negative for focal weakness. Has occasional foot tingling.  Endo/Heme/Allergies: Does not bruise/bleed easily.     EKGs/Labs/Other Studies Reviewed:    The following studies were reviewed today: No prior cardiac studies.  EKG:  EKG is personally reviewed.  The ekg ordered today demonstrates sinus tachycardia at 108 bpm.   Recent Labs: 10/23/2019: ALT 76; BUN 18; Creatinine, Ser 0.66; Hemoglobin 16.8; Platelets 179.0; Potassium 3.7; Sodium 135  Recent Lipid Panel    Component Value Date/Time   CHOL 160 10/23/2019 0926   TRIG 97.0 10/23/2019 0926   HDL 27.30 (L) 10/23/2019 0926   CHOLHDL 6 10/23/2019 0926   VLDL 19.4 10/23/2019 0926   LDLCALC 113 (H) 10/23/2019 0926   LDLDIRECT 118.0 10/23/2019 0926    Physical Exam:    VS:  Ht 6\' 3"  (1.905 m)   Wt 254 lb (115.2 kg)   BMI 31.75 kg/m    Orthostatics: Lying 123/88, HR 108 Sitting 114/85, HR 113 Standing 122/85, HR 112 Standing 3 min 132/84, HR 112  Wt Readings from Last 3 Encounters:  11/27/19 254 lb (115.2 kg)  10/30/19 257 lb (116.6 kg)  10/18/19 265 lb (120.2 kg)    GEN: Well  nourished, well developed in no acute distress HEENT: Normal, moist mucous membranes NECK: No JVD CARDIAC: regular rhythm, normal S1 and S2, no rubs or gallops. No murmurs. VASCULAR: Radial and DP pulses 2+ bilaterally. No carotid bruits RESPIRATORY:  Clear to auscultation without rales, wheezing or rhonchi  ABDOMEN: Soft, non-tender, non-distended MUSCULOSKELETAL:  Ambulates independently SKIN: Warm , no edema. Mildly diaphoretic in slightly warm room. NEUROLOGIC:  Alert and oriented x 3. No focal neuro deficits noted. PSYCHIATRIC:  Normal affect    ASSESSMENT:    1. Episodic lightheadedness   2. Sinus tachycardia   3. Syncope and collapse   4. Palpitations   5. Cardiac risk counseling   6. Counseling on health promotion and disease prevention    PLAN:     Lightheadedness: gradually improving but has been a persistent problem for >1 month, including one resultant episode of syncope.  -We spent significant time today reviewing different parts of the cardiovascular system (electrical, vascular, functional, and valvular). We discussed how each of these systems can present with different symptoms. We reviewed that there are different ways we evaluate these symptoms with tests. We reviewed which tests I think are most appropriate given  the symptoms, and we discussed risks/benefits and limitations of each of these tests. Please see summary below. We also discussed that if testing is unrevealing for a cardiac cause of the symptoms, there are many noncardiac causes as well that can contribute to symptoms. If the heart is ruled out, then I recommend returning to PCP to discuss alternative diagnoses. -after shared decision making, we will pursue echocardiogram to rule out structural heart disease as a cause of lightheadedness/syncope -not orthostatic on vital signs today -he had palpitations at the time. With syncope and episodic symptoms, will pursue event monitor to rule out arrhythmia as  cause  Palpitations: no recent symptoms, though does have sinus tachycardia and mild diaphoresis on exam today -check TSH  Hyperlipidemia, with type II diabetes as risk factor --discussed pathology of cholesterol, how plaques form, that MI/CVA result commonly from acute plaque rupture and not gradual stenosis. Discussed mechanism of statin to both decrease plaque accumulation and stabilize plaque that is already present. -we discussed the data on statins, both in terms of their long term benefit as well as the risk of side effects. Reviewed common misconceptions about statins. Reviewed how we monitor treatment. After shared decision making, he wishes to think about statins. He is also pending a liver ultrasounds to evaluate mildly elevated LFTS. The numbers are not high enough to preclude use of statins, and if fatty liver is the etiology, would encourage use of statins to treat this. Reasonable to wait until after results of ultrasound to further discuss statins  Cardiac risk counseling and prevention recommendations: -recommend heart healthy/Mediterranean diet, with whole grains, fruits, vegetable, fish, lean meats, nuts, and olive oil. Limit salt. -recommend moderate walking, 3-5 times/week for 30-50 minutes each session. Aim for at least 150 minutes.week. Goal should be pace of 3 miles/hours, or walking 1.5 miles in 30 minutes -recommend avoidance of tobacco products. Avoid excess alcohol. -Additional risk factor control:  -Diabetes risk: A1c is 10, though with excellent blood sugar numbers at home I suspect this will be significantly improved on recheck. Discussed statin as above. Also discussed 81 mg aspirin  -Lipids: as above, would recommend statin (if liver workup not otherwise concerning)  -Blood pressure control:at goal  -Weight: BMI 31, has lost 17 lbs, working on further lifestyle changes -ASCVD risk score: The 10-year ASCVD risk score Mikey Bussing DC Brooke Bonito., et al., 2013) is: 8.5%   Values  used to calculate the score:     Age: 48 years     Sex: Male     Is Non-Hispanic African American: No     Diabetic: Yes     Tobacco smoker: No     Systolic Blood Pressure: [id number] mmHg     Is BP treated: No     HDL Cholesterol: 27.3 mg/dL     Total Cholesterol: 160 mg/dL    Plan for follow up: TBD based on results of testing.   Total time of encounter: 85 minutes total time of encounter, including 45 minutes of the time spent in face-to-face patient care. This time includes coordination of care and counseling regarding evaluation of symptoms and recommendations for CV risk management in diabetes. Remainder of non-face-to-face time involved reviewing chart documents/testing relevant to the patient encounter and documentation in the medical record.  Buford Dresser, MD, PhD [place]  [facility]   Medication Adjustments/Labs and Tests Ordered: Current medicines are reviewed at length with the patient today.  Concerns regarding medicines are outlined above.  Orders Placed This Encounter  Procedures  .  TSH  . LONG TERM MONITOR (3-14 DAYS)  . EKG 12-Lead  . ECHOCARDIOGRAM COMPLETE   No orders of the defined types were placed in this encounter.   Patient Instructions  Medication Instructions:  Your Physician recommend you continue on your current medication as directed.    *If you need a refill on your cardiac medications before your next appointment, please call your pharmacy*  Lab Work: Your physician recommends that you return for lab work today (TSH)  If you have labs (blood work) drawn today and your tests are completely normal, you will receive your results only by: Marland Kitchen MyChart Message (if you have MyChart) OR . A paper copy in the mail If you have any lab test that is abnormal or we need to change your treatment, we will call you to review the results.  Testing/Procedures: Your physician has requested that you have an echocardiogram. Echocardiography is a  painless test that uses sound waves to create images of your heart. It provides your doctor with information about the size and shape of your heart and how well your heart's chambers and valves are working. This procedure takes approximately one hour. There are no restrictions for this procedure. [place] [address]  Our physician has recommended that you wear an 14  DAY ZIO-PATCH monitor. The Zio patch cardiac monitor continuously records heart rhythm data for up to 14 days, this is for patients being evaluated for multiple types heart rhythms. For the first 24 hours post application, please avoid getting the Zio monitor wet in the shower or by excessive sweating during exercise. After that, feel free to carry on with regular activities. Keep soaps and lotions away from the ZIO XT Patch.   Someone from our office will call to have monitor mailed.     Follow-Up: At [facility], you and your health needs are our priority.  As part of our continuing mission to provide you with exceptional heart care, we have created designated Provider Care Teams.  These Care Teams include your primary Cardiologist (physician) and Advanced Practice Providers (APPs -  Physician Assistants and Nurse Practitioners) who all work together to provide you with the care you need, when you need it.  Your next appointment:   As needed  The format for your next appointment:   Either In Person or Virtual  Provider:   Buford Dresser, MD   [place] Instructions   Your physician has requested you wear your ZIO patch monitor___14____days.   This is a single patch monitor.  [employer] supplies one patch monitor per enrollment.  Additional stickers are not available.   Please do not apply patch if you will be having a Nuclear Stress Test, Echocardiogram, Cardiac CT, MRI, or Chest Xray during the time frame you would be wearing the monitor. The patch cannot be worn during these tests.  You  cannot remove and re-apply the ZIO XT patch monitor.   Your ZIO patch monitor will be sent USPS Priority mail from [facility] directly to your home address. The monitor may also be mailed to a PO BOX if home delivery is not available.   It may take 3-5 days to receive your monitor after you have been enrolled.   Once you have received you monitor, please review enclosed instructions.  Your monitor has already been registered assigning a specific monitor serial # to you.   Applying the monitor   Shave hair from upper left chest.  Hold abrader disc by orange tab.  Rub abrader in 40 strokes over left upper chest as indicated in your monitor instructions.   Clean area with 4 enclosed alcohol pads .  Use all pads to assure are is cleaned thoroughly.  Let dry.   Apply patch as indicated in monitor instructions.  Patch will be place under collarbone on left side of chest with arrow pointing upward.   Rub patch adhesive wings for 2 minutes.Remove white label marked "1".  Remove white label marked "2".  Rub patch adhesive wings for 2 additional minutes.   While looking in a mirror, press and release button in center of patch.  A small green light will flash 3-4 times .  This will be your only indicator the monitor has been turned on.     Do not shower for the first 24 hours.  You may shower after the first 24 hours.   Press button if you feel a symptom. You will hear a small click.  Record Date, Time and Symptom in the Patient Log Book.   When you are ready to remove patch, follow instructions on last 2 pages of Patient Log Book.  Stick patch monitor onto last page of Patient Log Book.   Place Patient Log Book in [place] box.  Use locking tab on box and tape box closed securely.  The [employer] and [employer] has [employer] on it.  Please place in mailbox as soon as possible.  Your physician should have your test results approximately 7 days after the monitor has been mailed back to  [facility].   Call [place] at [phone] if you have questions regarding your ZIO XT patch monitor.  Call them immediately if you see an orange light blinking on your monitor.   If your monitor falls off in less than 4 days contact our Monitor department at [phone].  If your monitor becomes loose or falls off after 4 days call [employer] at [phone] for suggestions on securing your monitor.       Signed, Buford Dresser, MD PhD 11/27/2019 9:58 PM    [place]

## 2019-11-27 NOTE — Patient Instructions (Addendum)
Medication Instructions:  Your Physician recommend you continue on your current medication as directed.    *If you need a refill on your cardiac medications before your next appointment, please call your pharmacy*  Lab Work: Your physician recommends that you return for lab work today (TSH)  If you have labs (blood work) drawn today and your tests are completely normal, you will receive your results only by: Marland Kitchen MyChart Message (if you have MyChart) OR . A paper copy in the mail If you have any lab test that is abnormal or we need to change your treatment, we will call you to review the results.  Testing/Procedures: Your physician has requested that you have an echocardiogram. Echocardiography is a painless test that uses sound waves to create images of your heart. It provides your doctor with information about the size and shape of your heart and how well your heart's chambers and valves are working. This procedure takes approximately one hour. There are no restrictions for this procedure. [place] [address]  Our physician has recommended that you wear an 14  DAY ZIO-PATCH monitor. The Zio patch cardiac monitor continuously records heart rhythm data for up to 14 days, this is for patients being evaluated for multiple types heart rhythms. For the first 24 hours post application, please avoid getting the Zio monitor wet in the shower or by excessive sweating during exercise. After that, feel free to carry on with regular activities. Keep soaps and lotions away from the ZIO XT Patch.   Someone from our office will call to have monitor mailed.     Follow-Up: At [facility], you and your health needs are our priority.  As part of our continuing mission to provide you with exceptional heart care, we have created designated Provider Care Teams.  These Care Teams include your primary Cardiologist (physician) and Advanced Practice Providers (APPs -  Physician Assistants and Nurse  Practitioners) who all work together to provide you with the care you need, when you need it.  Your next appointment:   As needed  The format for your next appointment:   Either In Person or Virtual  Provider:   Buford Dresser, MD   [place] Instructions   Your physician has requested you wear your ZIO patch monitor___14____days.   This is a single patch monitor.  [employer] supplies one patch monitor per enrollment.  Additional stickers are not available.   Please do not apply patch if you will be having a Nuclear Stress Test, Echocardiogram, Cardiac CT, MRI, or Chest Xray during the time frame you would be wearing the monitor. The patch cannot be worn during these tests.  You cannot remove and re-apply the ZIO XT patch monitor.   Your ZIO patch monitor will be sent USPS Priority mail from [facility] directly to your home address. The monitor may also be mailed to a PO BOX if home delivery is not available.   It may take 3-5 days to receive your monitor after you have been enrolled.   Once you have received you monitor, please review enclosed instructions.  Your monitor has already been registered assigning a specific monitor serial # to you.   Applying the monitor   Shave hair from upper left chest.   Hold abrader disc by orange tab.  Rub abrader in 40 strokes over left upper chest as indicated in your monitor instructions.   Clean area with 4 enclosed alcohol pads .  Use all  pads to assure are is cleaned thoroughly.  Let dry.   Apply patch as indicated in monitor instructions.  Patch will be place under collarbone on left side of chest with arrow pointing upward.   Rub patch adhesive wings for 2 minutes.Remove white label marked "1".  Remove white label marked "2".  Rub patch adhesive wings for 2 additional minutes.   While looking in a mirror, press and release button in center of patch.  A small green light will flash 3-4 times .  This will be  your only indicator the monitor has been turned on.     Do not shower for the first 24 hours.  You may shower after the first 24 hours.   Press button if you feel a symptom. You will hear a small click.  Record Date, Time and Symptom in the Patient Log Book.   When you are ready to remove patch, follow instructions on last 2 pages of Patient Log Book.  Stick patch monitor onto last page of Patient Log Book.   Place Patient Log Book in [place] box.  Use locking tab on box and tape box closed securely.  The [employer] and [employer] has [employer] on it.  Please place in mailbox as soon as possible.  Your physician should have your test results approximately 7 days after the monitor has been mailed back to [facility].   Call [place] at [phone] if you have questions regarding your ZIO XT patch monitor.  Call them immediately if you see an orange light blinking on your monitor.   If your monitor falls off in less than 4 days contact our Monitor department at [phone].  If your monitor becomes loose or falls off after 4 days call [employer] at [phone] for suggestions on securing your monitor.

## 2019-11-27 NOTE — Progress Notes (Signed)
Diabetes Self-Management Education  Visit Type: First/Initial  Appt. Start Time: 1045 Appt. End Time: 1140  11/27/2019  [name], identified by name and date of birth, is a 49 y.o. male with a diagnosis of Diabetes: Type 2.   ASSESSMENT  There were no vitals taken for this visit. There is no height or weight on file to calculate BMI.   Pt reports over the last 5 wks has made changes to his diet, lost 17 lbs, and feeling better. Pt states he feels he is eating well, but would like to know what else he can eat so he doesn't get burned out on chicken and broccoli.  Pt states he started feeling light headed on Nov 22, and that evening passed out, was seen at ED and diagnosed with T2DM. Pt states the light headedness is getting better but still feels off. Pt states he will be seeing PCP for this issue.   Pt had a little bit of a cough and states this happens intermittently when he talks and has a little congestion.  Diabetes Self-Management Education - 11/27/19 1039      Visit Information   Visit Type  First/Initial      Initial Visit   Diabetes Type  Type 2    Are you currently following a meal plan?  No    Are you taking your medications as prescribed?  Yes   metformin 1500 mg   Date Diagnosed  10/15/19      Health Coping   How would you rate your overall health?  Fair      Psychosocial Assessment   Patient Belief/Attitude about Diabetes  Motivated to manage diabetes    How often do you need to have someone help you when you read instructions, pamphlets, or other written materials from your doctor or pharmacy?  1 - Never    What is the last grade level you completed in school?  BA      Complications   How often do you check your blood sugar?  1-2 times/day    Fasting Blood glucose range (mg/dL)  70-129   93-126   Postprandial Blood glucose range (mg/dL)  130-179;70-129    Number of hypoglycemic episodes per month  0    Number of hyperglycemic episodes per week  0    Have you had a dilated eye exam in the past 12 months?  No    Have you had a dental exam in the past 12 months?  Yes    Are you checking your feet?  Yes    How many days per week are you checking your feet?  7      Dietary Intake   Breakfast  2 HB eggs, mayo, s&p OR crackers with PB    Snack (morning)  none    Lunch  soup, saltines OR rotisserie chicken, apple, crackers & PB OR chicken nuggets,    Snack (afternoon)  granny smith apple    Dinner  soup OR chicken, vegetable    Snack (evening)  nuts OR apple    Beverage(s)  ~80 oz water, unsweet tea w truvia, coffee with milk & truvia      Exercise   Exercise Type  ADL's    How many days per week to you exercise?  0    How many minutes per day do you exercise?  0    Total minutes per week of exercise  0      Patient Education  Previous Diabetes Education  No    Nutrition management   Role of diet in the treatment of diabetes and the relationship between the three main macronutrients and blood glucose level;Food label reading, portion sizes and measuring food.;Carbohydrate counting    Physical activity and exercise   Role of exercise on diabetes management, blood pressure control and cardiac health.    Medications  Reviewed patients medication for diabetes, action, purpose, timing of dose and side effects.    Monitoring  Identified appropriate SMBG and/or A1C goals.    Psychosocial adjustment  Role of stress on diabetes      Individualized Goals (developed by patient)   Nutrition  General guidelines for healthy choices and portions discussed    Physical Activity  Exercise 3-5 times per week    Monitoring   test my blood glucose as discussed      Outcomes   Expected Outcomes  Demonstrated interest in learning. Expect positive outcomes    Future DMSE  PRN    Program Status  Completed       Individualized Plan for Diabetes Self-Management Training:   Learning Objective:  Patient will have a greater understanding of diabetes  self-management. Patient education plan is to attend individual and/or group sessions per assessed needs and concerns.    Patient Instructions  Plan:  Aim for 3-4 Carb Choices per meal (45-60 grams) +/- 1 either way  Aim for 0-30 grams carbs per snack if hungry  Include protein with your meals Consider reading food labels for Total Carbohydrate of foods Consider  increasing your activity level minutes daily as tolerated Consider checking blood sugar at alternate times per day to see how your changes are affecting your blood sugar  Continue taking medication as directed by MD   Expected Outcomes:  Demonstrated interest in learning. Expect positive outcomes  Education material provided: ADA - How to Thrive: A Guide for Your Journey with Diabetes, A1C conversion sheet and Carbohydrate counting sheet  If problems or questions, patient to contact team via:  Phone and MyChart  Future DSME appointment: PRN

## 2019-11-27 NOTE — Patient Instructions (Signed)
Plan:  Aim for 3-4 Carb Choices per meal (45-60 grams) +/- 1 either way  Aim for 0-30 grams carbs per snack if hungry  Include protein with your meals Consider reading food labels for Total Carbohydrate of foods Consider  increasing your activity level minutes daily as tolerated Consider checking blood sugar at alternate times per day to see how your changes are affecting your blood sugar  Continue taking medication as directed by MD

## 2019-11-28 ENCOUNTER — Encounter: Payer: Self-pay | Admitting: Family Medicine

## 2019-11-28 ENCOUNTER — Ambulatory Visit (INDEPENDENT_AMBULATORY_CARE_PROVIDER_SITE_OTHER): Payer: BC Managed Care – PPO | Admitting: Family Medicine

## 2019-11-28 VITALS — BP 122/78 | HR 86 | Temp 98.3°F | Ht 75.0 in | Wt 255.8 lb

## 2019-11-28 DIAGNOSIS — R42 Dizziness and giddiness: Secondary | ICD-10-CM | POA: Diagnosis not present

## 2019-11-28 DIAGNOSIS — E1140 Type 2 diabetes mellitus with diabetic neuropathy, unspecified: Secondary | ICD-10-CM | POA: Diagnosis not present

## 2019-11-28 DIAGNOSIS — Z7289 Other problems related to lifestyle: Secondary | ICD-10-CM

## 2019-11-28 DIAGNOSIS — R748 Abnormal levels of other serum enzymes: Secondary | ICD-10-CM | POA: Diagnosis not present

## 2019-11-28 DIAGNOSIS — Z789 Other specified health status: Secondary | ICD-10-CM | POA: Insufficient documentation

## 2019-11-28 DIAGNOSIS — E1165 Type 2 diabetes mellitus with hyperglycemia: Secondary | ICD-10-CM

## 2019-11-28 LAB — URINALYSIS, ROUTINE W REFLEX MICROSCOPIC
Bilirubin Urine: NEGATIVE
Hgb urine dipstick: NEGATIVE
Ketones, ur: NEGATIVE
Leukocytes,Ua: NEGATIVE
Nitrite: NEGATIVE
RBC / HPF: NONE SEEN (ref 0–?)
Specific Gravity, Urine: 1.025 (ref 1.000–1.030)
Total Protein, Urine: NEGATIVE
Urine Glucose: NEGATIVE
Urobilinogen, UA: 0.2 (ref 0.0–1.0)
pH: 5.5 (ref 5.0–8.0)

## 2019-11-28 LAB — COMPREHENSIVE METABOLIC PANEL
ALT: 45 U/L (ref 0–53)
AST: 25 U/L (ref 0–37)
Albumin: 4.5 g/dL (ref 3.5–5.2)
Alkaline Phosphatase: 72 U/L (ref 39–117)
BUN: 13 mg/dL (ref 6–23)
CO2: 26 mEq/L (ref 19–32)
Calcium: 9.4 mg/dL (ref 8.4–10.5)
Chloride: 103 mEq/L (ref 96–112)
Creatinine, Ser: 0.8 mg/dL (ref 0.40–1.50)
GFR: 102.05 mL/min (ref >60.00)
Glucose, Bld: 133 mg/dL — ABNORMAL HIGH (ref 70–99)
Potassium: 3.9 mEq/L (ref 3.5–5.1)
Sodium: 138 mEq/L (ref 135–145)
Total Bilirubin: 0.7 mg/dL (ref 0.2–1.2)
Total Protein: 6.6 g/dL (ref 6.0–8.3)

## 2019-11-28 LAB — TSH: TSH: 1.42 u[IU]/mL (ref 0.450–4.500)

## 2019-11-28 LAB — CORTISOL: Cortisol, Plasma: 10.7 ug/dL

## 2019-11-28 LAB — GAMMA GT: GGT: 30 U/L (ref 7–51)

## 2019-11-28 NOTE — Patient Instructions (Signed)
Dizziness Dizziness is a common problem. It is a feeling of unsteadiness or light-headedness. You may feel like you are about to faint. Dizziness can lead to injury if you stumble or fall. Anyone can become dizzy, but dizziness is more common in older adults. This condition can be caused by a number of things, including medicines, dehydration, or illness. Follow these instructions at home: Eating and drinking  Drink enough fluid to keep your urine clear or pale yellow. This helps to keep you from becoming dehydrated. Try to drink more clear fluids, such as water.  Do not drink alcohol.  Limit your caffeine intake if told to do so by your health care provider. Check ingredients and nutrition facts to see if a food or beverage contains caffeine.  Limit your salt (sodium) intake if told to do so by your health care provider. Check ingredients and nutrition facts to see if a food or beverage contains sodium. Activity  Avoid making quick movements. ? Rise slowly from chairs and steady yourself until you feel okay. ? In the morning, first sit up on the side of the bed. When you feel okay, stand slowly while you hold onto something until you know that your balance is fine.  If you need to stand in one place for a long time, move your legs often. Tighten and relax the muscles in your legs while you are standing.  Do not drive or use heavy machinery if you feel dizzy.  Avoid bending down if you feel dizzy. Place items in your home so that they are easy for you to reach without leaning over. Lifestyle  Do not use any products that contain nicotine or tobacco, such as cigarettes and e-cigarettes. If you need help quitting, ask your health care provider.  Try to reduce your stress level by using methods such as yoga or meditation. Talk with your health care provider if you need help to manage your stress. General instructions  Watch your dizziness for any changes.  Take over-the-counter and  prescription medicines only as told by your health care provider. Talk with your health care provider if you think that your dizziness is caused by a medicine that you are taking.  Tell a friend or a family member that you are feeling dizzy. If he or she notices any changes in your behavior, have this person call your health care provider.  Keep all follow-up visits as told by your health care provider. This is important. Contact a health care provider if:  Your dizziness does not go away.  Your dizziness or light-headedness gets worse.  You feel nauseous.  You have reduced hearing.  You have new symptoms.  You are unsteady on your feet or you feel like the room is spinning. Get help right away if:  You vomit or have diarrhea and are unable to eat or drink anything.  You have problems talking, walking, swallowing, or using your arms, hands, or legs.  You feel generally weak.  You are not thinking clearly or you have trouble forming sentences. It may take a friend or family member to notice this.  You have chest pain, abdominal pain, shortness of breath, or sweating.  Your vision changes.  You have any bleeding.  You have a severe headache.  You have neck pain or a stiff neck.  You have a fever. These symptoms may represent a serious problem that is an emergency. Do not wait to see if the symptoms will go away. Get medical help   right away. Call your local emergency services (911 in the [place] Do not drive yourself to the hospital. Summary  Dizziness is a feeling of unsteadiness or light-headedness. This condition can be caused by a number of things, including medicines, dehydration, or illness.  Anyone can become dizzy, but dizziness is more common in older adults.  Drink enough fluid to keep your urine clear or pale yellow. Do not drink alcohol.  Avoid making quick movements if you feel dizzy. Monitor your dizziness for any changes. This information is not intended to  replace advice given to you by your health care provider. Make sure you discuss any questions you have with your health care provider. Document Revised: 11/12/2017 Document Reviewed: 12/12/2016 Elsevier Patient Education  2020 [employer]  

## 2019-11-28 NOTE — Progress Notes (Signed)
Established Patient Office Visit  Subjective:  Patient ID: Terry Bonilla, male    DOB: 04-04-1971  Age: 49 y.o. MRN: [id number]  CC:  Chief Complaint  Patient presents with  . Follow-up    4 week f/u on medications and diabetes. Pt states that he still feels light headed but no passing out.     HPI Terry Bonilla presents for follow-up of his diabetes.  He is currently taking 750 mg of Glucophage ER twice daily.  Fasting sugars have been in the less than 140 range.  He is tolerating the medication well.  There is some lingering loose stool but he is tolerating it.  Continues to experience lightheadedness without a spinning sensation.  The meclizine was not helpful.  Lightheadedness is improved but it is still there.  He assures me that he is hydrating well.  Has had 1 beer in the last 41 days.  Continues to hydrate well but sometimes his urine is dark despite his best efforts.  Past Medical History:  Diagnosis Date  . Diabetes mellitus without complication ([place])     History reviewed. No pertinent surgical history.  History reviewed. No pertinent family history.  Social History   Socioeconomic History  . Marital status: Unknown    Spouse name: Not on file  . Number of children: Not on file  . Years of education: Not on file  . Highest education level: Not on file  Occupational History  . Not on file  Tobacco Use  . Smoking status: Never Smoker  . Smokeless tobacco: Never Used  Substance and Sexual Activity  . Alcohol use: Yes  . Drug use: Never  . Sexual activity: Not on file  Other Topics Concern  . Not on file  Social History Narrative  . Not on file   Social Determinants of Health   Financial Resource Strain:   . Difficulty of Paying Living Expenses: Not on file  Food Insecurity:   . Worried About Charity fundraiser in the Last Year: Not on file  . Ran Out of Food in the Last Year: Not on file  Transportation Needs:   . Lack of Transportation (Medical): Not  on file  . Lack of Transportation (Non-Medical): Not on file  Physical Activity:   . Days of Exercise per Week: Not on file  . Minutes of Exercise per Session: Not on file  Stress:   . Feeling of Stress : Not on file  Social Connections:   . Frequency of Communication with Friends and Family: Not on file  . Frequency of Social Gatherings with Friends and Family: Not on file  . Attends Religious Services: Not on file  . Active Member of Clubs or Organizations: Not on file  . Attends Archivist Meetings: Not on file  . Marital Status: Not on file  Intimate Partner Violence:   . Fear of Current or Ex-Partner: Not on file  . Emotionally Abused: Not on file  . Physically Abused: Not on file  . Sexually Abused: Not on file    Outpatient Medications Prior to Visit  Medication Sig Dispense Refill  . betamethasone dipropionate 0.05 % cream Apply 1 application topically as needed.    . finasteride (PROPECIA) 1 MG tablet Take 1 tablet by mouth daily.    Marland Kitchen glucose blood (TRUE METRIX BLOOD GLUCOSE TEST) test strip Use to test blood sugars 1-2 times daily. 100 each 3  . metFORMIN (GLUCOPHAGE-XR) 750 MG 24 hr tablet Take  1500 mg by mouth with dinner (Patient taking differently: Take 750 mg by mouth. Take 1 tablet with breakfast and 1 tablet with dinner.) 180 tablet 3  . TRUEplus Lancets 30G MISC Use to test blood sugars 1-2 times daily. 100 each 3  . meclizine (ANTIVERT) 25 MG tablet Take 1 tablet (25 mg total) by mouth 3 (three) times daily as needed for dizziness. (Patient not taking: Reported on 11/28/2019) 30 tablet 0   No facility-administered medications prior to visit.    Allergies  Allergen Reactions  . Crab [Shellfish Allergy] Hives  . Sulfa Antibiotics Hives  . Sulfur Rash    ROS Review of Systems  Constitutional: Negative for chills, diaphoresis, fatigue, fever and unexpected weight change.  HENT: Negative.   Eyes: Negative for photophobia and visual disturbance.    Respiratory: Positive for cough. Negative for chest tightness, shortness of breath and wheezing.   Cardiovascular: Negative.   Gastrointestinal: Negative.   Endocrine: Negative for polyphagia and polyuria.  Genitourinary: Negative.   Musculoskeletal: Negative for gait problem and joint swelling.  Skin: Negative for pallor and rash.  Allergic/Immunologic: Negative for immunocompromised state.  Neurological: Positive for light-headedness. Negative for dizziness and headaches.  Hematological: Does not bruise/bleed easily.  Psychiatric/Behavioral: Negative.       Objective:    Physical Exam  BP 122/78   Pulse 86   Temp 98.3 F (36.8 C) (Oral)   Ht _0  (1.905 m)   Wt 255 lb 12.8 oz (116 kg)   SpO2 98%   BMI 31.97 kg/m  Wt Readings from Last 3 Encounters:  11/28/19 255 lb 12.8 oz (116 kg)  11/27/19 254 lb (115.2 kg)  10/30/19 257 lb (116.6 kg)     Health Maintenance Due  Topic Date Due  . PNEUMOCOCCAL POLYSACCHARIDE VACCINE AGE 41-64 HIGH RISK  03/25/1971  . FOOT EXAM  03/25/1979  . OPHTHALMOLOGY EXAM  03/25/1979  . HIV Screening  03/24/1984  . COLONOSCOPY  03/25/2019    There are no preventive care reminders to display for this patient.  Lab Results  Component Value Date   TSH 1.420 11/27/2019   Lab Results  Component Value Date   WBC 4.2 10/23/2019   HGB 16.8 10/23/2019   HCT 47.4 10/23/2019   MCV 90.7 10/23/2019   PLT 179.0 10/23/2019   Lab Results  Component Value Date   NA 135 10/23/2019   K 3.7 10/23/2019   CO2 22 10/23/2019   GLUCOSE 176 (H) 10/23/2019   BUN 18 10/23/2019   CREATININE 0.66 10/23/2019   BILITOT 0.9 10/23/2019   ALKPHOS 69 10/23/2019   AST 45 (H) 10/23/2019   ALT 76 (H) 10/23/2019   PROT 6.5 10/23/2019   ALBUMIN 4.2 10/23/2019   CALCIUM 9.1 10/23/2019   ANIONGAP 9 10/18/2019   GFR 127.46 10/23/2019   Lab Results  Component Value Date   CHOL 160 10/23/2019   Lab Results  Component Value Date   HDL 27.30 (L) 10/23/2019    Lab Results  Component Value Date   LDLCALC 113 (H) 10/23/2019   Lab Results  Component Value Date   TRIG 97.0 10/23/2019   Lab Results  Component Value Date   CHOLHDL 6 10/23/2019   Lab Results  Component Value Date   HGBA1C 10.0 (H) 10/23/2019      Assessment & Plan:   Problem List Items Addressed This Visit      Endocrine   Type 2 diabetes mellitus with hyperglycemia, without long-term current use of  insulin ([place]) - Primary   Relevant Orders   Urinalysis, Routine w reflex microscopic   Comp Met (CMET)     Other   Lightheadedness   Relevant Orders   Urinalysis, Routine w reflex microscopic   Cortisol   Elevated liver enzymes   Relevant Orders   Comp Met (CMET)   Gamma GT   Alcohol use      No orders of the defined types were placed in this encounter.   Follow-up: Return in about 2 months (around 01/26/2020).   We will continue hydrating and checking his fingerstick blood sugars.  Lightheadedness persist.  He did not tilt with orthostatics measured today.  Cortisol level will be checked today.  Abdominal ultrasound is scheduled for tomorrow. Libby Maw, MD

## 2019-11-29 ENCOUNTER — Ambulatory Visit
Admission: RE | Admit: 2019-11-29 | Discharge: 2019-11-29 | Disposition: A | Discharge status: Home / Self Care | Payer: BC Managed Care – PPO | Source: Ambulatory Visit | Attending: Family Medicine | Admitting: Family Medicine

## 2019-11-29 ENCOUNTER — Telehealth: Payer: Self-pay | Admitting: Radiology

## 2019-11-29 DIAGNOSIS — R7989 Other specified abnormal findings of blood chemistry: Secondary | ICD-10-CM

## 2019-11-29 DIAGNOSIS — K824 Cholesterolosis of gallbladder: Secondary | ICD-10-CM | POA: Diagnosis not present

## 2019-11-29 DIAGNOSIS — R748 Abnormal levels of other serum enzymes: Secondary | ICD-10-CM | POA: Diagnosis not present

## 2019-11-29 NOTE — Telephone Encounter (Signed)
Enrolled patient for a 14 day Zio monitor to be mailed to patients home.  

## 2019-12-04 ENCOUNTER — Other Ambulatory Visit (HOSPITAL_COMMUNITY): Payer: BC Managed Care – PPO

## 2019-12-05 ENCOUNTER — Telehealth: Payer: Self-pay | Admitting: Family Medicine

## 2019-12-05 NOTE — Telephone Encounter (Signed)
Spoke with patient who verbally understood A1C was not drawn with last labs due to pt recently having this done back in November. Pt aware he will have A1C drawn when he comes in for his visit in March

## 2019-12-05 NOTE — Telephone Encounter (Signed)
Patient is calling to find out if labs were drawn for his A1C when he was in the office on 11/28/19. Patient stated that he had discussed with Dr. Ethelene Hal about having that done when he was in office. Please contact patient at [phone].

## 2019-12-06 ENCOUNTER — Encounter: Payer: Self-pay | Admitting: Family Medicine

## 2019-12-06 ENCOUNTER — Other Ambulatory Visit (HOSPITAL_COMMUNITY): Payer: BC Managed Care – PPO

## 2019-12-06 ENCOUNTER — Other Ambulatory Visit: Payer: Self-pay

## 2019-12-06 ENCOUNTER — Ambulatory Visit (HOSPITAL_COMMUNITY): Payer: BC Managed Care – PPO | Attending: Cardiology

## 2019-12-06 DIAGNOSIS — R55 Syncope and collapse: Secondary | ICD-10-CM | POA: Insufficient documentation

## 2019-12-06 LAB — HM DIABETES EYE EXAM

## 2019-12-12 ENCOUNTER — Other Ambulatory Visit (INDEPENDENT_AMBULATORY_CARE_PROVIDER_SITE_OTHER): Payer: BC Managed Care – PPO

## 2019-12-12 DIAGNOSIS — R55 Syncope and collapse: Secondary | ICD-10-CM | POA: Diagnosis not present

## 2019-12-12 DIAGNOSIS — R000 Tachycardia, unspecified: Secondary | ICD-10-CM | POA: Diagnosis not present

## 2019-12-22 DIAGNOSIS — G4733 Obstructive sleep apnea (adult) (pediatric): Secondary | ICD-10-CM | POA: Diagnosis not present

## 2020-01-01 ENCOUNTER — Other Ambulatory Visit: Payer: Self-pay

## 2020-01-02 ENCOUNTER — Encounter: Payer: Self-pay | Admitting: Internal Medicine

## 2020-01-02 ENCOUNTER — Ambulatory Visit (INDEPENDENT_AMBULATORY_CARE_PROVIDER_SITE_OTHER): Payer: BC Managed Care – PPO | Admitting: Internal Medicine

## 2020-01-02 VITALS — BP 102/70 | HR 88 | Ht 75.0 in | Wt 268.0 lb

## 2020-01-02 DIAGNOSIS — E1140 Type 2 diabetes mellitus with diabetic neuropathy, unspecified: Secondary | ICD-10-CM

## 2020-01-02 DIAGNOSIS — E785 Hyperlipidemia, unspecified: Secondary | ICD-10-CM

## 2020-01-02 LAB — POCT GLYCOSYLATED HEMOGLOBIN (HGB A1C): Hemoglobin A1C: 6.4 % — AB (ref 4.0–5.6)

## 2020-01-02 NOTE — Patient Instructions (Addendum)
Please continue: - Metformin ER 750 mg 2x a day with meals  Please return in 3-4 months with your sugar log.

## 2020-01-02 NOTE — Addendum Note (Signed)
Addended by: Cardell Peach I on: 01/02/2020 01:05 PM   Modules accepted: Orders

## 2020-01-02 NOTE — Progress Notes (Signed)
Patient ID: Terry Bonilla, male   DOB: 1971-06-28, 49 y.o.   MRN: [id number]   This visit occurred during the SARS-CoV-2 public health emergency.  Safety protocols were in place, including screening questions prior to the visit, additional usage of staff PPE, and extensive cleaning of exam room while observing appropriate contact time as indicated for disinfecting solutions.   HPI: Terry Bonilla is a 49 y.o.-year-old male, referred by his PCP, Dr. Ethelene Hal, for management of DM2, dx on 10/15/2019, non-insulin-dependent, uncontrolled, with long-term complications (peripheral neuropathy).  Last visit 2 months ago.  He was diagnosed with diabetes in 09/2019 after he presented to the ED with a syncopal episode.  In ED, glucose was 372 and he had glycosuria on urinalysis.  HbA1c was high.  He was started on Metformin.  Review latest HbA1c level: Lab Results  Component Value Date   HGBA1C 10.0 (H) 10/23/2019   Pt is on a regimen of:  - Metformin ER 750 mg 1x a day >> 1500 mg with dinner >> lightheadedness >> 750 mg 2x a day He had palpitations with an instant release Metformin.  Pt checks his sugars twice a day: - am: 145-172, 207 >> 125-158, 177, 184 - 2h after b'fast: n/c - before lunch: n/c - 2h after lunch: n/c - before dinner: n/c >> 72-155 - 2h after dinner: n/c - bedtime: 130, 141-169, 209 >> n/c - nighttime: n/c Lowest sugar was 130;  It is unclear at which level he has hypoglycemia awareness Highest sugar was 209 >> 209.  Glucometer: [employer] brand  Pt's meals are better after his dx: - Breakfast: [place] links - Lunch: chicken and broccoli - pm: nuts - Dinner: chisken - Snacks: whole wheat He was drinking sweet drinks but stopped before our last visit  -No CKD, last BUN/creatinine:  Lab Results  Component Value Date   BUN 13 11/28/2019   BUN 18 10/23/2019   CREATININE 0.80 11/28/2019   CREATININE 0.66 10/23/2019   -+ HL; last set of lipids: Lab Results  Component Value  Date   CHOL 160 10/23/2019   HDL 27.30 (L) 10/23/2019   LDLCALC 113 (H) 10/23/2019   LDLDIRECT 118.0 10/23/2019   TRIG 97.0 10/23/2019   CHOLHDL 6 10/23/2019   - last eye exam was in 01.2021: No DR. He was Rec'd Travaprost for increased pressure.  - he denies numbness and tingling in his feet.  Pt has FH of DM in elder brother.  He also has a history of alcohol use and transaminitis. He has dizziness >> started Meclizine yesterday and his dizziness has improved.  ROS: Constitutional: + weight gain/no weight loss, no fatigue, no subjective hyperthermia, no subjective hypothermia Eyes: no blurry vision, no xerophthalmia ENT: no sore throat, no nodules palpated in neck, no dysphagia, no odynophagia, no hoarseness Cardiovascular: no CP/no SOB/no palpitations/no leg swelling Respiratory: no cough/no SOB/no wheezing Gastrointestinal: no N/no V/no D/no C/no acid reflux Musculoskeletal: no muscle aches/no joint aches Skin: no rashes, no hair loss Neurological: no tremors/no numbness/no tingling/no dizziness, + lightheadedness  I reviewed pt's medications, allergies, PMH, social hx, family hx, and changes were documented in the history of present illness. Otherwise, unchanged from my initial visit note.  Past Medical History:  Diagnosis Date  . Diabetes mellitus without complication ([place])    No past surgical history on file. Social History   Socioeconomic History  . Marital status: Unknown    Spouse name: Not on file  . Number of children: Not on  file  . Years of education: Not on file  . Highest education level: Not on file  Occupational History  . Not on file  Tobacco Use  . Smoking status: Never Smoker  . Smokeless tobacco: Never Used  Substance and Sexual Activity  . Alcohol use: Yes  . Drug use: Never  . Sexual activity: Not on file  Other Topics Concern  . Not on file  Social History Narrative  . Not on file   Social Determinants of Health   Financial Resource  Strain:   . Difficulty of Paying Living Expenses: Not on file  Food Insecurity:   . Worried About Charity fundraiser in the Last Year: Not on file  . Ran Out of Food in the Last Year: Not on file  Transportation Needs:   . Lack of Transportation (Medical): Not on file  . Lack of Transportation (Non-Medical): Not on file  Physical Activity:   . Days of Exercise per Week: Not on file  . Minutes of Exercise per Session: Not on file  Stress:   . Feeling of Stress : Not on file  Social Connections:   . Frequency of Communication with Friends and Family: Not on file  . Frequency of Social Gatherings with Friends and Family: Not on file  . Attends Religious Services: Not on file  . Active Member of Clubs or Organizations: Not on file  . Attends Archivist Meetings: Not on file  . Marital Status: Not on file  Intimate Partner Violence:   . Fear of Current or Ex-Partner: Not on file  . Emotionally Abused: Not on file  . Physically Abused: Not on file  . Sexually Abused: Not on file   Current Outpatient Medications on File Prior to Visit  Medication Sig Dispense Refill  . betamethasone dipropionate 0.05 % cream Apply 1 application topically as needed.    . finasteride (PROPECIA) 1 MG tablet Take 1 tablet by mouth daily.    Marland Kitchen glucose blood (TRUE METRIX BLOOD GLUCOSE TEST) test strip Use to test blood sugars 1-2 times daily. 100 each 3  . meclizine (ANTIVERT) 25 MG tablet Take 1 tablet (25 mg total) by mouth 3 (three) times daily as needed for dizziness. (Patient not taking: Reported on 11/28/2019) 30 tablet 0  . metFORMIN (GLUCOPHAGE-XR) 750 MG 24 hr tablet Take 1500 mg by mouth with dinner (Patient taking differently: Take 750 mg by mouth. Take 1 tablet with breakfast and 1 tablet with dinner.) 180 tablet 3  . TRUEplus Lancets 30G MISC Use to test blood sugars 1-2 times daily. 100 each 3   No current facility-administered medications on file prior to visit.   Allergies  Allergen  Reactions  . Crab [Shellfish Allergy] Hives  . Sulfa Antibiotics Hives  . Sulfur Rash   PMH: -Pertinent PMH reviewed in HPI  PE:  BP 102/70   Pulse 88   Ht 6\' 3"  (1.905 m)   Wt 268 lb (121.6 kg)   SpO2 98%   BMI 33.50 kg/m  Wt Readings from Last 3 Encounters:  01/02/20 268 lb (121.6 kg)  11/28/19 255 lb 12.8 oz (116 kg)  11/27/19 254 lb (115.2 kg)   Constitutional: overweight, in NAD Eyes: PERRLA, EOMI, no exophthalmos ENT: moist mucous membranes, no thyromegaly, no cervical lymphadenopathy Cardiovascular: RRR, No MRG Respiratory: CTA B Gastrointestinal: abdomen soft, NT, ND, BS+ Musculoskeletal: no deformities, strength intact in all 4 Skin: moist, warm, no rashes Neurological: no tremor with outstretched hands, DTR  normal in all 4  ASSESSMENT: 1. DM2, non-insulin-dependent, uncontrolled, with complications - PN  2. HL  3. Obesity class 1  PLAN:  1. Patient with recent diagnosis of diabetes found to have high blood sugar and a high HbA1c after a syncopal episode in 09/2019.  He had no history of prediabetes or diabetes before.  He was initially started on Metformin IR, which he could not tolerate due to palpitations.  He was then switched to Metformin ER 750 mg daily, which he tolerated well.  At last visit, sugars appear improved and we increase the Metformin dose to 1500 mg daily.  We also discussed about improving his diet, which she already started to change before last visit.  He eliminated sweet drinks and improved the quality of his meals.  He had an appointment with nutrition last month. - at this visit, his sugars are improved and mostly at or close to goal with the exception of the times when he has bourbon or chocolate >> discussed about reducing these and stopping snacking in general.  - He had lightheadedness and he assumed this was from Metformin. This is now improved - unclear if related to Metformin. Advised to increase hydration, but OTW, will continue the  same regimen. We can decrease the Metformin ER dose to 500 mg 2x a day if needed in the future.  - I suggested to:  Patient Instructions  Please continue: - Metformin ER 1500 mg with dinner  Please return in 3 months with your sugar log.   - we checked his HbA1c: 6.4% (improved!) - advised to check sugars at different times of the day - 1x a day, rotating check times - advised for yearly eye exams >> he is UTD - needs foot exam at next OV - return to clinic in 3 months  2. HL - Reviewed latest lipid panel from 09/2019: LDL above target, HDL low Lab Results  Component Value Date   CHOL 160 10/23/2019   HDL 27.30 (L) 10/23/2019   LDLCALC 113 (H) 10/23/2019   LDLDIRECT 118.0 10/23/2019   TRIG 97.0 10/23/2019   CHOLHDL 6 10/23/2019  -He is not on a statin. - improving DM and weight should help  3. Obesity class 1 - gained weight since last OV: 13 lbs - discussed about reducing snacks as a mean to improve his weight and insulin resistance  Philemon Kingdom, MD PhD [facility]

## 2020-01-17 DIAGNOSIS — H40023 Open angle with borderline findings, high risk, bilateral: Secondary | ICD-10-CM | POA: Diagnosis not present

## 2020-01-26 ENCOUNTER — Encounter: Payer: Self-pay | Admitting: Family Medicine

## 2020-01-26 ENCOUNTER — Ambulatory Visit (INDEPENDENT_AMBULATORY_CARE_PROVIDER_SITE_OTHER): Payer: BC Managed Care – PPO | Admitting: Family Medicine

## 2020-01-26 ENCOUNTER — Other Ambulatory Visit: Payer: Self-pay

## 2020-01-26 VITALS — BP 138/79 | HR 90 | Temp 96.0°F | Ht 75.0 in | Wt 270.8 lb

## 2020-01-26 DIAGNOSIS — E1140 Type 2 diabetes mellitus with diabetic neuropathy, unspecified: Secondary | ICD-10-CM | POA: Diagnosis not present

## 2020-01-26 DIAGNOSIS — L2082 Flexural eczema: Secondary | ICD-10-CM | POA: Diagnosis not present

## 2020-01-26 DIAGNOSIS — E7800 Pure hypercholesterolemia, unspecified: Secondary | ICD-10-CM

## 2020-01-26 MED ORDER — TRIAMCINOLONE ACETONIDE 0.025 % EX OINT: 1.0000 "application " | TOPICAL_OINTMENT | Freq: Two times a day (BID) | CUTANEOUS | 0 refills | Status: AC

## 2020-01-26 MED ORDER — ATORVASTATIN CALCIUM 10 MG PO TABS: 10.0000 mg | ORAL_TABLET | Freq: Every day | ORAL | 3 refills | Status: DC

## 2020-01-26 MED ORDER — TRUE METRIX BLOOD GLUCOSE TEST VI STRP: ORAL_STRIP | 3 refills | Status: DC

## 2020-01-26 NOTE — Progress Notes (Signed)
Established Patient Office Visit  Subjective:  Patient ID: Terry Bonilla, male    DOB: Aug 25, 1971  Age: 49 y.o. MRN: [id number]  CC:  Chief Complaint  Patient presents with  . Follow-up    2 month follow up on diabetes, no concerns.     HPI Terry Bonilla presents for follow-up of his diabetes.  Recently saw his eye doctor and diagnosed with early glaucoma.  He is taking drops for that.  There is some erythema in his eyes.  He tells me that in photographic image was taken of his retina and the eye doctor said that it looked good.  Seeing endocrinology.  Fortunately his labyrinthitis has all but resolved.  He has tingling in his feet on an intermittent basis.  Past Medical History:  Diagnosis Date  . Diabetes mellitus without complication ([place])     History reviewed. No pertinent surgical history.  History reviewed. No pertinent family history.  Social History   Socioeconomic History  . Marital status: Unknown    Spouse name: Not on file  . Number of children: Not on file  . Years of education: Not on file  . Highest education level: Not on file  Occupational History  . Not on file  Tobacco Use  . Smoking status: Never Smoker  . Smokeless tobacco: Never Used  Substance and Sexual Activity  . Alcohol use: Yes  . Drug use: Never  . Sexual activity: Not on file  Other Topics Concern  . Not on file  Social History Narrative  . Not on file   Social Determinants of Health   Financial Resource Strain:   . Difficulty of Paying Living Expenses: Not on file  Food Insecurity:   . Worried About Charity fundraiser in the Last Year: Not on file  . Ran Out of Food in the Last Year: Not on file  Transportation Needs:   . Lack of Transportation (Medical): Not on file  . Lack of Transportation (Non-Medical): Not on file  Physical Activity:   . Days of Exercise per Week: Not on file  . Minutes of Exercise per Session: Not on file  Stress:   . Feeling of Stress : Not on file    Social Connections:   . Frequency of Communication with Friends and Family: Not on file  . Frequency of Social Gatherings with Friends and Family: Not on file  . Attends Religious Services: Not on file  . Active Member of Clubs or Organizations: Not on file  . Attends Archivist Meetings: Not on file  . Marital Status: Not on file  Intimate Partner Violence:   . Fear of Current or Ex-Partner: Not on file  . Emotionally Abused: Not on file  . Physically Abused: Not on file  . Sexually Abused: Not on file    Outpatient Medications Prior to Visit  Medication Sig Dispense Refill  . betamethasone dipropionate 0.05 % cream Apply 1 application topically as needed.    . finasteride (PROPECIA) 1 MG tablet Take 1 tablet by mouth daily.    . metFORMIN (GLUCOPHAGE-XR) 750 MG 24 hr tablet Take 1500 mg by mouth with dinner (Patient taking differently: Take 750 mg by mouth. Take 1 tablet with breakfast and 1 tablet with dinner.) 180 tablet 3  . Travoprost, BAK Free, (TRAVATAN) 0.004 % SOLN ophthalmic solution 1 drop at bedtime.    . TRUEplus Lancets 30G MISC Use to test blood sugars 1-2 times daily. 100 each 3  .  glucose blood (TRUE METRIX BLOOD GLUCOSE TEST) test strip Use to test blood sugars 1-2 times daily. 100 each 3  . meclizine (ANTIVERT) 25 MG tablet Take 1 tablet (25 mg total) by mouth 3 (three) times daily as needed for dizziness. (Patient not taking: Reported on 11/28/2019) 30 tablet 0   No facility-administered medications prior to visit.    Allergies  Allergen Reactions  . Crab [Shellfish Allergy] Hives  . Sulfa Antibiotics Hives  . Sulfur Rash    ROS Review of Systems  Constitutional: Negative.   HENT: Negative.   Respiratory: Negative.   Cardiovascular: Negative.   Gastrointestinal: Negative.   Musculoskeletal: Negative for gait problem and joint swelling.  Neurological: Negative for dizziness and light-headedness.  Hematological: Negative.    Psychiatric/Behavioral: Negative.       Objective:    Physical Exam  Constitutional: He is oriented to person, place, and time. He appears well-developed and well-nourished. No distress.  HENT:  Head: Normocephalic and atraumatic.  Right Ear: External ear normal.  Left Ear: External ear normal.  Eyes: Conjunctivae are normal. Right eye exhibits no discharge. Left eye exhibits no discharge. No scleral icterus.  Neck: No JVD present. No tracheal deviation present. No thyromegaly present.  Cardiovascular: Normal rate, regular rhythm and normal heart sounds.  Pulses:      Dorsalis pedis pulses are 1+ on the right side and 1+ on the left side.       Posterior tibial pulses are 1+ on the right side and 1+ on the left side.  Pulmonary/Chest: Effort normal and breath sounds normal. No stridor.  Musculoskeletal:        General: No edema.  Lymphadenopathy:    He has no cervical adenopathy.  Neurological: He is alert and oriented to person, place, and time.  Skin: Skin is warm and dry. He is not diaphoretic.     Psychiatric: He has a normal mood and affect. His behavior is normal.   Diabetic Foot Exam - Simple   Simple Foot Form Diabetic Foot exam was performed with the following findings: Yes 01/26/2020  9:02 AM  Visual Inspection See comments: Yes Sensation Testing Intact to touch and monofilament testing bilaterally: Yes Pulse Check Posterior Tibialis and Dorsalis pulse intact bilaterally: Yes Comments Feet are pes planus     BP 138/79   Pulse 90   Temp (!) 96 F (35.6 C) (Tympanic)   Ht 6\' 3"  (1.905 m)   Wt 270 lb 12.8 oz (122.8 kg)   SpO2 98%   BMI 33.85 kg/m  Wt Readings from Last 3 Encounters:  01/26/20 270 lb 12.8 oz (122.8 kg)  01/02/20 268 lb (121.6 kg)  11/28/19 255 lb 12.8 oz (116 kg)     Health Maintenance Due  Topic Date Due  . PNEUMOCOCCAL POLYSACCHARIDE VACCINE AGE 36-64 HIGH RISK  03/25/1971  . HIV Screening  03/24/1984  . COLONOSCOPY  03/25/2019     There are no preventive care reminders to display for this patient.  Lab Results  Component Value Date   TSH 1.420 11/27/2019   Lab Results  Component Value Date   WBC 4.2 10/23/2019   HGB 16.8 10/23/2019   HCT 47.4 10/23/2019   MCV 90.7 10/23/2019   PLT 179.0 10/23/2019   Lab Results  Component Value Date   NA 138 11/28/2019   K 3.9 11/28/2019   CO2 26 11/28/2019   GLUCOSE 133 (H) 11/28/2019   BUN 13 11/28/2019   CREATININE 0.80 11/28/2019   BILITOT  0.7 11/28/2019   ALKPHOS 72 11/28/2019   AST 25 11/28/2019   ALT 45 11/28/2019   PROT 6.6 11/28/2019   ALBUMIN 4.5 11/28/2019   CALCIUM 9.4 11/28/2019   ANIONGAP 9 10/18/2019   GFR 102.05 11/28/2019   Lab Results  Component Value Date   CHOL 160 10/23/2019   Lab Results  Component Value Date   HDL 27.30 (L) 10/23/2019   Lab Results  Component Value Date   LDLCALC 113 (H) 10/23/2019   Lab Results  Component Value Date   TRIG 97.0 10/23/2019   Lab Results  Component Value Date   CHOLHDL 6 10/23/2019   Lab Results  Component Value Date   HGBA1C 6.4 (A) 01/02/2020      Assessment & Plan:   Problem List Items Addressed This Visit      Musculoskeletal and Integument   Flexural eczema   Relevant Medications   triamcinolone (KENALOG) 0.025 % ointment     Other   Elevated LDL cholesterol level   Relevant Medications   atorvastatin (LIPITOR) 10 MG tablet    Other Visit Diagnoses    Type 2 diabetes mellitus with diabetic neuropathy, without long-term current use of insulin ([facility])    -  Primary   Relevant Medications   atorvastatin (LIPITOR) 10 MG tablet   glucose blood (TRUE METRIX BLOOD GLUCOSE TEST) test strip      Meds ordered this encounter  Medications  . atorvastatin (LIPITOR) 10 MG tablet    Sig: Take 1 tablet (10 mg total) by mouth daily.    Dispense:  90 tablet    Refill:  3  . glucose blood (TRUE METRIX BLOOD GLUCOSE TEST) test strip    Sig: Use to test blood sugars 1-2 times  daily.    Dispense:  100 each    Refill:  3  . triamcinolone (KENALOG) 0.025 % ointment    Sig: Apply 1 application topically 2 (two) times daily. Not for face or private areas.    Dispense:  80 g    Refill:  0    Follow-up: Return in about 6 months (around 07/28/2020).   Continue Glucophage.  Patient agrees to give low-dose atorvastatin to try.  He will report any unusual muscle aches and pains. Libby Maw, MD

## 2020-01-26 NOTE — Patient Instructions (Signed)
Diabetic Neuropathy Diabetic neuropathy refers to nerve damage that is caused by diabetes (diabetes mellitus). Over time, people with diabetes can develop nerve damage throughout the body. There are several types of diabetic neuropathy:  Peripheral neuropathy. This is the most common type of diabetic neuropathy. It causes damage to nerves that carry signals between the spinal cord and other parts of the body (peripheral nerves). This usually affects nerves in the feet and legs first, and may eventually affect the hands and arms. The damage affects the ability to sense touch or temperature.  Autonomic neuropathy. This type causes damage to nerves that control involuntary functions (autonomic nerves). These nerves carry signals that control: ? Heartbeat. ? Body temperature. ? Blood pressure. ? Urination. ? Digestion. ? Sweating. ? Sexual function. ? Response to changing blood sugar (glucose) levels.  Focal neuropathy. This type of nerve damage affects one area of the body, such as an arm, a leg, or the face. The injury may involve one nerve or a small group of nerves. Focal neuropathy can be painful and unpredictable, and occurs most often in older adults with diabetes. This often develops suddenly, but usually improves over time and does not cause long-term problems.  Proximal neuropathy. This type of nerve damage affects the nerves of the thighs, hips, buttocks, or legs. It causes severe pain, weakness, and muscle death (atrophy), usually in the thigh muscles. It is more common among older men and people who have type 2 diabetes. The length of recovery time may vary. What are the causes? Peripheral, autonomic, and focal neuropathies are caused by diabetes that is not well controlled with treatment. The cause of proximal neuropathy is not known, but it may be caused by inflammation related to uncontrolled blood glucose levels. What are the signs or symptoms? Peripheral neuropathy Peripheral  neuropathy develops slowly over time. When the nerves of the feet and legs no longer work, you may experience:  Burning, stabbing, or aching pain in the legs or feet.  Pain or cramping in the legs or feet.  Loss of feeling (numbness) and inability to feel pressure or pain in the feet. This can lead to: ? Thick calluses or sores on areas of constant pressure. ? Ulcers. ? Reduced ability to feel temperature changes.  Foot deformities.  Muscle weakness.  Loss of balance or coordination. Autonomic neuropathy The symptoms of autonomic neuropathy vary depending on which nerves are affected. Symptoms may include:  Problems with digestion, such as: ? Nausea or vomiting. ? Poor appetite. ? Bloating. ? Diarrhea or constipation. ? Trouble swallowing. ? Losing weight without trying to.  Problems with the heart, blood and lungs, such as: ? Dizziness, especially when standing up. ? Fainting. ? Shortness of breath. ? Irregular heartbeat.  Bladder problems, such as: ? Trouble starting or stopping urination. ? Leaking urine. ? Trouble emptying the bladder. ? Urinary tract infections (UTIs).  Problems with other body functions, such as: ? Sweat. You may sweat too much or too little. ? Temperature. You might get hot easily. Or, you might feel cold more than usual. ? Sexual function. Men may not be able to get or maintain an erection. Women may have vaginal dryness and difficulty with arousal. Focal neuropathy Symptoms affect only one area of the body. Common symptoms include:  Numbness.  Tingling.  Burning pain.  Prickling feeling.  Very sensitive skin.  Weakness.  Inability to move (paralysis).  Muscle twitching.  Muscles getting smaller (wasting).  Poor coordination.  Double or blurred vision. Proximal   neuropathy  Sudden, severe pain in the hip, thigh, or buttocks. Pain may spread from the back into the legs (sciatica).  Pain and numbness in the arms and  legs.  Tingling.  Loss of bladder control or bowel control.  Weakness and wasting of thigh muscles.  Difficulty getting up from a seated position.  Abdominal swelling.  Unexplained weight loss. How is this diagnosed? Diagnosis usually involves reviewing your medical history and any symptoms you have. Diagnosis varies depending on the type of neuropathy your health care provider suspects. Peripheral neuropathy Your health care provider will check areas that are affected by your nervous system (neurologic exam), such as your reflexes, how you move, and what you can feel. You may have other tests, such as:  Blood tests.  Removal and examination of fluid that surrounds the spinal cord (lumbar puncture).  CT scan.  MRI.  A test to check the nerves that control muscles (electromyogram, EMG).  Tests of how quickly messages pass through your nerves (nerve conduction velocity tests).  Removal of a small piece of nerve to be examined under a microscope (biopsy). Autonomic neuropathy You may have tests, such as:  Tests to measure your blood pressure and heart rate. This may include monitoring you while you are safely secured to an exam table that moves you from a lying position to an upright position (table tilt test).  Breathing tests to check your lungs.  Tests to check how food moves through the digestive system (gastric emptying tests).  Blood, sweat, or urine tests.  Ultrasound of your bladder.  Spinal fluid tests. Focal neuropathy This condition may be diagnosed with:  A neurologic exam.  CT scan.  MRI.  EMG.  Nerve conduction velocity tests. Proximal neuropathy There is no test to diagnose this type of neuropathy. You may have tests to rule out other possible causes of this type of neuropathy. Tests may include:  X-rays of your spine and lumbar region.  Lumbar puncture.  MRI. How is this treated? The goal of treatment is to keep nerve damage from getting  worse. The most important part of treatment is keeping your blood glucose level and your A1C level within your target range by following your diabetes management plan. Over time, maintaining lower blood glucose levels helps lessen symptoms. In some cases, you may need prescription pain medicine. Follow these instructions at home:  Lifestyle   Do not use any products that contain nicotine or tobacco, such as cigarettes and e-cigarettes. If you need help quitting, ask your health care provider.  Be physically active every day. Include strength training and balance exercises.  Follow a healthy meal plan.  Work with your health care provider to manage your blood pressure. General instructions  Follow your diabetes management plan as directed. ? Check your blood glucose levels as directed by your health care provider. ? Keep your blood glucose in your target range as directed by your health care provider. ? Have your A1C level checked at least two times a year, or as often as told by your health care provider.  Take over the counter and prescription medicines only as told by your health care provider. This includes insulin and diabetes medicine.  Do not drive or use heavy machinery while taking prescription pain medicines.  Check your skin and feet every day for cuts, bruises, redness, blisters, or sores.  Keep all follow up visits as told by your health care provider. This is important. Contact a health care provider if:  You have burning, stabbing, or aching pain in your legs or feet.  You are unable to feel pressure or pain in your feet.  You develop problems with digestion, such as: ? Nausea. ? Vomiting. ? Bloating. ? Constipation. ? Diarrhea. ? Abdominal pain.  You have difficulty with urination, such as inability: ? To control when you urinate (incontinence). ? To completely empty the bladder (retention).  You have palpitations.  You feel dizzy, weak, or faint when you  stand up. Get help right away if:  You cannot urinate.  You have sudden weakness or loss of coordination.  You have trouble speaking.  You have pain or pressure in your chest.  You have an irregular heart beat.  You have sudden inability to move a part of your body. Summary  Diabetic neuropathy refers to nerve damage that is caused by diabetes. It can affect nerves throughout the entire body, causing numbness and pain in the arms, legs, digestive tract, heart, and other body systems.  Keep your blood glucose level and your blood pressure in your target range, as directed by your health care provider. This can help prevent neuropathy from getting worse.  Check your skin and feet every day for cuts, bruises, redness, blisters, or sores.  Do not use any products that contain nicotine or tobacco, such as cigarettes and e-cigarettes. If you need help quitting, ask your health care provider. This information is not intended to replace advice given to you by your health care provider. Make sure you discuss any questions you have with your health care provider. Document Revised: 12/22/2017 Document Reviewed: 12/14/2016 Elsevier Patient Education  2020 [place].  Preventing High Cholesterol Cholesterol is a white, waxy substance similar to fat that the human body needs to help build cells. The liver makes all the cholesterol that a person's body needs. Having high cholesterol (hypercholesterolemia) increases a person's risk for heart disease and stroke. Extra (excess) cholesterol comes from the food the person eats. High cholesterol can often be prevented with diet and lifestyle changes. If you already have high cholesterol, you can control it with diet and lifestyle changes and with medicine. How can high cholesterol affect me? If you have high cholesterol, deposits (plaques) may build up on the walls of your arteries. The arteries are the blood vessels that carry blood away from your  heart. Plaques make the arteries narrower and stiffer. This can limit or block blood flow and cause blood clots to form. Blood clots:  Are tiny balls of cells that form in your blood.  Can move to the heart or brain, causing a heart attack or stroke. Plaques in arteries greatly increase your risk for heart attack and stroke.Making diet and lifestyle changes can reduce your risk for these conditions that may threaten your life. What can increase my risk? This condition is more likely to develop in people who:  Eat foods that are high in saturated fat or cholesterol. Saturated fat is mostly found in: ? Foods that contain animal fat, such as red meat and some dairy products. ? Certain fatty foods made from plants, such as tropical oils.  Are overweight.  Are not getting enough exercise.  Have a family history of high cholesterol. What actions can I take to prevent this? Nutrition   Eat less saturated fat.  Avoid trans fats (partially hydrogenated oils). These are often found in margarine and in some baked goods, fried foods, and snacks bought in packages.  Avoid precooked or cured meat, such  as sausages or meat loaves.  Avoid foods and drinks that have added sugars.  Eat more fruits, vegetables, and whole grains.  Choose healthy sources of protein, such as fish, poultry, lean cuts of red meat, beans, peas, lentils, and nuts.  Choose healthy sources of fat, such as: ? Nuts. ? Vegetable oils, especially olive oil. ? Fish that have healthy fats (omega-3 fatty acids), such as mackerel or salmon. The items listed above may not be a complete list of recommended foods and beverages. Contact a dietitian for more information. Lifestyle  Lose weight if you are overweight. Losing 5-10 lb (2.3-4.5 kg) can help prevent or control high cholesterol. It can also lower your risk for diabetes and high blood pressure. Ask your health care provider to help you with a diet and exercise plan to lose  weight safely.  Do not use any products that contain nicotine or tobacco, such as cigarettes, e-cigarettes, and chewing tobacco. If you need help quitting, ask your health care provider.  Limit your alcohol intake. ? Do not drink alcohol if:  Your health care provider tells you not to drink.  You are pregnant, may be pregnant, or are planning to become pregnant. ? If you drink alcohol:  Limit how much you use to:  0-1 drink a day for women.  0-2 drinks a day for men.  Be aware of how much alcohol is in your drink. In the [place] one drink equals one 12 oz bottle of beer (355 mL), one 5 oz glass of wine (148 mL), or one 1 oz glass of hard liquor (44 mL). Activity   Get enough exercise. Each week, do at least 150 minutes of exercise that takes a medium level of effort (moderate-intensity exercise). ? This is exercise that:  Makes your heart beat faster and makes you breathe harder than usual.  Allows you to still be able to talk. ? You could exercise in short sessions several times a day or longer sessions a few times a week. For example, on 5 days each week, you could walk fast or ride your bike 3 times a day for 10 minutes each time.  Do exercises as told by your health care provider. Medicines  In addition to diet and lifestyle changes, your health care provider may recommend medicines to help lower cholesterol. This may be a medicine to lower the amount of cholesterol your liver makes. You may need medicine if: ? Diet and lifestyle changes do not lower your cholesterol enough. ? You have high cholesterol and other risk factors for heart disease or stroke.  Take over-the-counter and prescription medicines only as told by your health care provider. General information  Manage your risk factors for high cholesterol. Talk with your health care provider about all your risk factors and how to lower your risk.  Manage other conditions that you have, such as diabetes or high blood  pressure (hypertension).  Have blood tests to check your cholesterol levels at regular points in time as told by your health care provider.  Keep all follow-up visits as told by your health care provider. This is important. Where to find more information  American Heart Association: www.heart.org  National Heart, Lung, and Blood Institute: https://wilson-eaton.com/ Summary  High cholesterol increases your risk for heart disease and stroke. By keeping your cholesterol level low, you can reduce your risk for these conditions.  High cholesterol can often be prevented with diet and lifestyle changes.  Work with your health care provider to  manage your risk factors, and have your blood tested regularly. This information is not intended to replace advice given to you by your health care provider. Make sure you discuss any questions you have with your health care provider. Document Revised: 03/03/2019 Document Reviewed: 07/18/2016 Elsevier Patient Education  [place]. Atorvastatin tablets What is this medicine? ATORVASTATIN (a TORE va sta tin) is known as a HMG-CoA reductase inhibitor or 'statin'. It lowers the level of cholesterol and triglycerides in the blood. This drug may also reduce the risk of heart attack, stroke, or other health problems in patients with risk factors for heart disease. Diet and lifestyle changes are often used with this drug. This medicine may be used for other purposes; ask your health care provider or pharmacist if you have questions. COMMON BRAND NAME(S): Lipitor What should I tell my health care provider before I take this medicine? They need to know if you have any of these conditions:  diabetes  if you often drink alcohol  history of stroke  kidney disease  liver disease  muscle aches or weakness  thyroid disease  an unusual or allergic reaction to atorvastatin, other medicines, foods, dyes, or preservatives  pregnant or trying to get  pregnant  breast-feeding How should I use this medicine? Take this medicine by mouth with a glass of water. Follow the directions on the prescription label. You can take it with or without food. If it upsets your stomach, take it with food. Do not take with grapefruit juice. Take your medicine at regular intervals. Do not take it more often than directed. Do not stop taking except on your doctor's advice. Talk to your pediatrician regarding the use of this medicine in children. While this drug may be prescribed for children as young as 10 for selected conditions, precautions do apply. Overdosage: If you think you have taken too much of this medicine contact a poison control center or emergency room at once. NOTE: This medicine is only for you. Do not share this medicine with others. What if I miss a dose? If you miss a dose, take it as soon as you can. If your next dose is to be taken in less than 12 hours, then do not take the missed dose. Take the next dose at your regular time. Do not take double or extra doses. What may interact with this medicine? Do not take this medicine with any of the following medications:  dasabuvir; ombitasvir; paritaprevir; ritonavir  ombitasvir; paritaprevir; ritonavir  posaconazole  red yeast rice This medicine may also interact with the following medications:  alcohol  birth control pills  certain antibiotics like erythromycin and clarithromycin  certain antivirals for HIV or hepatitis  certain medicines for cholesterol like fenofibrate, gemfibrozil, and niacin  certain medicines for fungal infections like ketoconazole and itraconazole  colchicine  cyclosporine  digoxin  grapefruit juice  rifampin This list may not describe all possible interactions. Give your health care provider a list of all the medicines, herbs, non-prescription drugs, or dietary supplements you use. Also tell them if you smoke, drink alcohol, or use illegal drugs. Some  items may interact with your medicine. What should I watch for while using this medicine? Visit your doctor or health care professional for regular check-ups. You may need regular tests to make sure your liver is working properly. Your health care professional may tell you to stop taking this medicine if you develop muscle problems. If your muscle problems do not go away after stopping  this medicine, contact your health care professional. Do not become pregnant while taking this medicine. Women should inform their health care professional if they wish to become pregnant or think they might be pregnant. There is a potential for serious side effects to an unborn child. Talk to your health care professional or pharmacist for more information. Do not breast-feed an infant while taking this medicine. This medicine may increase blood sugar. Ask your healthcare provider if changes in diet or medicines are needed if you have diabetes. If you are going to need surgery or other procedure, tell your doctor that you are using this medicine. This drug is only part of a total heart-health program. Your doctor or a dietician can suggest a low-cholesterol and low-fat diet to help. Avoid alcohol and smoking, and keep a proper exercise schedule. This medicine may cause a decrease in Co-Enzyme Q-10. You should make sure that you get enough Co-Enzyme Q-10 while you are taking this medicine. Discuss the foods you eat and the vitamins you take with your health care professional. What side effects may I notice from receiving this medicine? Side effects that you should report to your doctor or health care professional as soon as possible:  allergic reactions like skin rash, itching or hives, swelling of the face, lips, or tongue  fever  joint pain  loss of memory  redness, blistering, peeling or loosening of the skin, including inside the mouth  signs and symptoms of high blood sugar such as being more thirsty or  hungry or having to urinate more than normal. You may also feel very tired or have blurry vision.  signs and symptoms of liver injury like dark yellow or brown urine; general ill feeling or flu-like symptoms; light-belly pain; unusually weak or tired; yellowing of the eyes or skin  signs and symptoms of muscle injury like dark urine; trouble passing urine or change in the amount of urine; unusually weak or tired; muscle pain or side or back pain Side effects that usually do not require medical attention (report to your doctor or health care professional if they continue or are bothersome):  diarrhea  nausea  stomach pain  trouble sleeping  upset stomach This list may not describe all possible side effects. Call your doctor for medical advice about side effects. You may report side effects to FDA at 1-800-FDA-1088. Where should I keep my medicine? Keep out of the reach of children. Store between 20 and 25 degrees C (68 and 77 degrees F). Throw away any unused medicine after the expiration date. NOTE: This sheet is a summary. It may not cover all possible information. If you have questions about this medicine, talk to your doctor, pharmacist, or health care provider.  2020 Elsevier/Gold Standard (2018-08-31 11:36:16)  Preventing Diabetes Mellitus Complications You can take action to prevent or slow down problems that are caused by diabetes (diabetes mellitus). Following your diabetes plan and taking care of yourself can reduce your risk of serious or life-threatening complications. What actions can I take to prevent diabetes complications? Manage your diabetes   Follow instructions from your health care providers about managing your diabetes. Your diabetes may be managed by a team of health care providers who can teach you how to care for yourself and can answer questions that you have.  Educate yourself about your condition so you can make healthy choices about eating and physical  activity.  Check your blood sugar (glucose) levels as often as directed. Your health care provider will  help you decide how often to check your blood glucose level depending on your treatment goals and how well you are meeting them.  Ask your health care provider if you should take low-dose aspirin daily and what dose is recommended for you. Taking low-dose aspirin daily is recommended to help prevent cardiovascular disease. Do not use nicotine or tobacco Do not use any products that contain nicotine or tobacco, such as cigarettes and e-cigarettes. If you need help quitting, ask your health care provider. Nicotine raises your risk for diabetes problems. If you quit using nicotine:  You will lower your risk for heart attack, stroke, nerve disease, and kidney disease.  Your cholesterol and blood pressure may improve.  Your blood circulation will improve. Keep your blood pressure under control Your personal target blood pressure is determined based on:  Your age.  Your medicines.  How long you have had diabetes.  Any other medical conditions you have. To control your blood pressure:  Follow instructions from your health care provider about meal planning, exercise, and medicines.  Make sure your health care provider checks your blood pressure at every medical visit.  Monitor your blood pressure at home as told by your health care provider.  Keep your cholesterol under control To control your cholesterol:  Follow instructions from your health care provider about meal planning, exercise, and medicines.  Have your cholesterol checked at least once a year.  You may be prescribed medicine to lower cholesterol (statin). If you are not taking a statin, ask your health care provider if you should be. Controlling your cholesterol may:  Help prevent heart disease and stroke. These are the most common health problems for people with diabetes.  Improve your blood flow. Schedule and keep  yearly physical exams and eye exams Your health care provider will tell you how often you need medical visits depending on your diabetes management plan. Keep all follow-up visits as directed. This is important so possible problems can be identified early and complications can be avoided or treated.  Every visit with your health care provider should include measuring your: ? Weight. ? Blood pressure. ? Blood glucose control.  Your A1c (hemoglobin A1c) level should be checked: ? At least 2 times a year, if you are meeting your treatment goals. ? 4 times a year, if you are not meeting treatment goals or if your treatment goals have changed.  Your blood lipids (lipid profile) should be checked yearly. You should also be checked yearly for protein in your urine (urine microalbumin).  If you have type 1 diabetes, get an eye exam 3-5 years after you are diagnosed, and then once a year after your first exam.  If you have type 2 diabetes, get an eye exam as soon as you are diagnosed, and then once a year after your first exam. Keep your vaccines current It is recommended that you receive:  A flu (influenza) vaccine every year.  A pneumonia (pneumococcal) vaccine and a hepatitis B vaccine. If you are age 39 or older, you may get the pneumonia vaccine as a series of two separate shots. Ask your health care provider which other vaccines may be recommended. Take care of your feet Diabetes may cause you to have poor blood circulation to your legs and feet. Because of this, taking care of your feet is very important. Diabetes can cause:  The skin on the feet to get thinner, break more easily, and heal more slowly.  Nerve damage in your legs and  feet, which results in decreased feeling. You may not notice minor injuries that could lead to serious problems. To avoid foot problems:  Check your skin and feet every day for cuts, bruises, redness, blisters, or sores.  Schedule a foot exam with your  health care provider once every year. This exam includes: ? Inspecting of the structure and skin of your feet. ? Checking the pulses and sensation in your feet.  Make sure that your health care provider performs a visual foot exam at every medical visit.  Take care of your teeth People with poorly controlled diabetes are more likely to have gum (periodontal) disease. Diabetes can make periodontal diseases harder to control. If not treated, periodontal diseases can lead to tooth loss. To prevent this:  Brush your teeth twice a day.  Floss at least once a day.  Visit your dentist 2 times a year. Drink responsibly Limit alcohol intake to no more than 1 drink a day for nonpregnant women and 2 drinks a day for men. One drink equals 12 oz of beer, 5 oz of wine, or 1 oz of hard liquor.  It is important to eat food when you drink alcohol to avoid low blood glucose (hypoglycemia). Avoid alcohol if you:  Have a history of alcohol abuse or dependence.  Are pregnant.  Have liver disease, pancreatitis, advanced neuropathy, or severe hypertriglyceridemia. Lessen stress Living with diabetes can be stressful. When you are experiencing stress, your blood glucose may be affected in two ways:  Stress hormones may cause your blood glucose to rise.  You may be distracted from taking good care of yourself. Be aware of your stress level and make changes to help you manage challenging situations. To lower your stress levels:  Consider joining a support group.  Do planned relaxation or meditation.  Do a hobby that you enjoy.  Maintain healthy relationships.  Exercise regularly.  Work with your health care provider or a mental health professional. Summary  You can take action to prevent or slow down problems that are caused by diabetes (diabetes mellitus). Following your diabetes plan and taking care of yourself can reduce your risk of serious or life-threatening complications.  Follow  instructions from your health care providers about managing your diabetes. Your diabetes may be managed by a team of health care providers who can teach you how to care for yourself and can answer questions that you have.  Your health care provider will tell you how often you need medical visits depending on your diabetes management plan. Keep all follow-up visits as directed. This is important so possible problems can be identified early and complications can be avoided or treated. This information is not intended to replace advice given to you by your health care provider. Make sure you discuss any questions you have with your health care provider. Document Revised: 02/07/2018 Document Reviewed: 08/08/2016 Elsevier Patient Education  [place].

## 2020-01-29 ENCOUNTER — Telehealth: Payer: Self-pay | Admitting: Internal Medicine

## 2020-01-29 ENCOUNTER — Encounter: Payer: Self-pay | Admitting: Internal Medicine

## 2020-01-29 NOTE — Telephone Encounter (Signed)
Called pt and informed him about Dr. Cordelia Pen response below. Pt agreed to see his PCP for further advice. Pt asked if Dr. Cruzita Lederer will be back in the office this week. Advised she will return tomorrow. Pt then asked for Dr. Arman Filter input on the following:  States, "I have done some research on Metformin and the literature states Metformin can alter B12 levels." Pt then asked, "Is it possible that I have an altered b12 level from taking metformin which is resulting in numbness in my feet?"

## 2020-01-29 NOTE — Telephone Encounter (Signed)
Patient called to advise that he has been experiencing numbness in both feet.  Onset was yesterday intermittent, but has persisted into today.  Call back number is [phone]

## 2020-01-29 NOTE — Telephone Encounter (Signed)
Called pt and informed him about Dr. Arman Filter response below. States he will call his PCP first. If they are not able to draw B12 level, will call our office to schedule lab appt with our office. Pt is not inclined to wait until May to evaluate B12 level.

## 2020-01-29 NOTE — Telephone Encounter (Signed)
This could be due to your DM, but last A1c was godd.  Please see primary care provider for this

## 2020-01-29 NOTE — Telephone Encounter (Signed)
Yes, very possible. He will need a B12 level checked at his visit with PCP or our next OV, whichever is first.

## 2020-01-29 NOTE — Telephone Encounter (Signed)
Can you please advise in Dr Arman Filter absence?

## 2020-01-30 ENCOUNTER — Encounter: Payer: Self-pay | Admitting: Family Medicine

## 2020-01-30 ENCOUNTER — Ambulatory Visit (INDEPENDENT_AMBULATORY_CARE_PROVIDER_SITE_OTHER): Payer: BC Managed Care – PPO | Admitting: Family Medicine

## 2020-01-30 ENCOUNTER — Other Ambulatory Visit: Payer: Self-pay

## 2020-01-30 VITALS — BP 132/78 | HR 84 | Temp 96.5°F | Ht 75.0 in | Wt 269.6 lb

## 2020-01-30 DIAGNOSIS — E538 Deficiency of other specified B group vitamins: Secondary | ICD-10-CM | POA: Diagnosis not present

## 2020-01-30 DIAGNOSIS — G629 Polyneuropathy, unspecified: Secondary | ICD-10-CM | POA: Diagnosis not present

## 2020-01-30 DIAGNOSIS — E1140 Type 2 diabetes mellitus with diabetic neuropathy, unspecified: Secondary | ICD-10-CM | POA: Diagnosis not present

## 2020-01-30 DIAGNOSIS — Z130 Encounter for screening for diseases of the blood and blood-forming organs and certain disorders involving the immune mechanism: Secondary | ICD-10-CM

## 2020-01-30 DIAGNOSIS — Z0000 Encounter for general adult medical examination without abnormal findings: Secondary | ICD-10-CM

## 2020-01-30 LAB — B12 AND FOLATE PANEL
Folate: 18.3 ng/mL (ref >5.9)
Vitamin B-12: 196 pg/mL — ABNORMAL LOW (ref 211–911)

## 2020-01-30 NOTE — Patient Instructions (Signed)

## 2020-01-30 NOTE — Progress Notes (Addendum)
Established Patient Office Visit  Subjective:  Patient ID: Terry Bonilla, male    DOB: July 21, 1971  Age: 49 y.o. MRN: [id number]  CC:  Chief Complaint  Patient presents with  . Acute Visit    c/o feet feeling numb and tingly x 2 days. Also states that calfs feel like pins and needles pricks.     HPI Terry Bonilla presents for recent development of numbness and tingling in both of his feet.  He occasionally feels sharp pains randomly in his calves almost like an insect bite.  Fortunately at this point these are relatively rare maybe 2-3 times daily.  Patient denies back pain.  Denies saddle paresthesias or changes in his bowel or bladder function.  There is been no injury to account for this.  He has tried no medicines for it.  He checked the Internet and found that B12 could be a possible cause.  More recently diagnosed with diabetes.  Admits that he probably had it for some time now.  Past Medical History:  Diagnosis Date  . Diabetes mellitus without complication ([place])     History reviewed. No pertinent surgical history.  History reviewed. No pertinent family history.  Social History   Socioeconomic History  . Marital status: Unknown    Spouse name: Not on file  . Number of children: Not on file  . Years of education: Not on file  . Highest education level: Not on file  Occupational History  . Not on file  Tobacco Use  . Smoking status: Never Smoker  . Smokeless tobacco: Never Used  Substance and Sexual Activity  . Alcohol use: Yes  . Drug use: Never  . Sexual activity: Not on file  Other Topics Concern  . Not on file  Social History Narrative  . Not on file   Social Determinants of Health   Financial Resource Strain:   . Difficulty of Paying Living Expenses: Not on file  Food Insecurity:   . Worried About Charity fundraiser in the Last Year: Not on file  . Ran Out of Food in the Last Year: Not on file  Transportation Needs:   . Lack of Transportation  (Medical): Not on file  . Lack of Transportation (Non-Medical): Not on file  Physical Activity:   . Days of Exercise per Week: Not on file  . Minutes of Exercise per Session: Not on file  Stress:   . Feeling of Stress : Not on file  Social Connections:   . Frequency of Communication with Friends and Family: Not on file  . Frequency of Social Gatherings with Friends and Family: Not on file  . Attends Religious Services: Not on file  . Active Member of Clubs or Organizations: Not on file  . Attends Archivist Meetings: Not on file  . Marital Status: Not on file  Intimate Partner Violence:   . Fear of Current or Ex-Partner: Not on file  . Emotionally Abused: Not on file  . Physically Abused: Not on file  . Sexually Abused: Not on file    Outpatient Medications Prior to Visit  Medication Sig Dispense Refill  . atorvastatin (LIPITOR) 10 MG tablet Take 1 tablet (10 mg total) by mouth daily. 90 tablet 3  . betamethasone dipropionate 0.05 % cream Apply 1 application topically as needed.    . finasteride (PROPECIA) 1 MG tablet Take 1 tablet by mouth daily.    Marland Kitchen glucose blood (TRUE METRIX BLOOD GLUCOSE TEST) test strip  Use to test blood sugars 1-2 times daily. 100 each 3  . metFORMIN (GLUCOPHAGE-XR) 750 MG 24 hr tablet Take 1500 mg by mouth with dinner (Patient taking differently: Take 750 mg by mouth. Take 1 tablet with breakfast and 1 tablet with dinner.) 180 tablet 3  . Travoprost, BAK Free, (TRAVATAN) 0.004 % SOLN ophthalmic solution 1 drop at bedtime.    . triamcinolone (KENALOG) 0.025 % ointment Apply 1 application topically 2 (two) times daily. Not for face or private areas. 80 g 0  . TRUEplus Lancets 30G MISC Use to test blood sugars 1-2 times daily. 100 each 3  . meclizine (ANTIVERT) 25 MG tablet Take 1 tablet (25 mg total) by mouth 3 (three) times daily as needed for dizziness. (Patient not taking: Reported on 11/28/2019) 30 tablet 0   No facility-administered medications  prior to visit.    Allergies  Allergen Reactions  . Crab [Shellfish Allergy] Hives  . Sulfa Antibiotics Hives  . Sulfur Rash    ROS Review of Systems  Constitutional: Negative.   Respiratory: Negative.   Cardiovascular: Negative.   Gastrointestinal: Negative.   Endocrine: Negative for polyphagia and polyuria.  Genitourinary: Negative.   Musculoskeletal: Negative for back pain, gait problem and myalgias.  Skin: Negative.   Neurological: Positive for numbness. Negative for weakness.      Objective:    Physical Exam  Constitutional: He is oriented to person, place, and time. He appears well-developed and well-nourished. No distress.  HENT:  Head: Atraumatic.  Left Ear: External ear normal.  Eyes: Pupils are equal, round, and reactive to light. Conjunctivae are normal. Right eye exhibits no discharge. Left eye exhibits no discharge. No scleral icterus.  Neck: No JVD present. No tracheal deviation present.  Cardiovascular: Normal rate, regular rhythm and normal heart sounds.  Pulmonary/Chest: Effort normal and breath sounds normal. No stridor.  Musculoskeletal:     Lumbar back: No tenderness or bony tenderness. Normal range of motion.  Neurological: He is alert and oriented to person, place, and time. He has normal strength.  Negative dural tension signs.   Skin: He is not diaphoretic.    BP 132/78   Pulse 84   Temp (!) 96.5 F (35.8 C) (Tympanic)   Ht 6\' 3"  (1.905 m)   Wt 269 lb 9.6 oz (122.3 kg)   SpO2 99%   BMI 33.70 kg/m  Wt Readings from Last 3 Encounters:  01/30/20 269 lb 9.6 oz (122.3 kg)  01/26/20 270 lb 12.8 oz (122.8 kg)  01/02/20 268 lb (121.6 kg)     Health Maintenance Due  Topic Date Due  . PNEUMOCOCCAL POLYSACCHARIDE VACCINE AGE 69-64 HIGH RISK  03/25/1971  . COLONOSCOPY  03/25/2019    There are no preventive care reminders to display for this patient.  Lab Results  Component Value Date   TSH 1.420 11/27/2019   Lab Results  Component  Value Date   WBC 4.2 10/23/2019   HGB 16.8 10/23/2019   HCT 47.4 10/23/2019   MCV 90.7 10/23/2019   PLT 179.0 10/23/2019   Lab Results  Component Value Date   NA 138 11/28/2019   K 3.9 11/28/2019   CO2 26 11/28/2019   GLUCOSE 133 (H) 11/28/2019   BUN 13 11/28/2019   CREATININE 0.80 11/28/2019   BILITOT 0.7 11/28/2019   ALKPHOS 72 11/28/2019   AST 25 11/28/2019   ALT 45 11/28/2019   PROT 6.6 11/28/2019   ALBUMIN 4.5 11/28/2019   CALCIUM 9.4 11/28/2019   ANIONGAP  9 10/18/2019   GFR 102.05 11/28/2019   Lab Results  Component Value Date   CHOL 160 10/23/2019   Lab Results  Component Value Date   HDL 27.30 (L) 10/23/2019   Lab Results  Component Value Date   LDLCALC 113 (H) 10/23/2019   Lab Results  Component Value Date   TRIG 97.0 10/23/2019   Lab Results  Component Value Date   CHOLHDL 6 10/23/2019   Lab Results  Component Value Date   HGBA1C 6.4 (A) 01/02/2020      Assessment & Plan:   Problem List Items Addressed This Visit      Endocrine   Type 2 diabetes mellitus with diabetic neuropathy, without long-term current use of insulin (HCC) - Primary     Nervous and Auditory   Neuropathy   Relevant Medications   vitamin B-12 (CYANOCOBALAMIN) 100 MCG tablet   Other Relevant Orders   B12 and Folate Panel (Completed)   Ambulatory referral to Neurology     Other   Healthcare maintenance   Relevant Orders   Ambulatory referral to Gastroenterology   HIV Antibody (routine testing w rflx) (Completed)   B12 deficiency   Relevant Medications   vitamin B-12 (CYANOCOBALAMIN) 100 MCG tablet      Meds ordered this encounter  Medications  . vitamin B-12 (CYANOCOBALAMIN) 100 MCG tablet    Sig: Take 1 tablet (100 mcg total) by mouth daily.    Dispense:  100 tablet    Refill:  4    Follow-up: Return in 3 months (on 05/01/2020), or if symptoms worsen or fail to improve.   Patient requests neurology referral.  He was given information on parental  neuropathy.  We discussed using gabapentin if the painful paresthesias become more frequent. Libby Maw, MD

## 2020-01-31 DIAGNOSIS — G629 Polyneuropathy, unspecified: Secondary | ICD-10-CM | POA: Insufficient documentation

## 2020-01-31 DIAGNOSIS — E538 Deficiency of other specified B group vitamins: Secondary | ICD-10-CM | POA: Insufficient documentation

## 2020-01-31 LAB — HIV ANTIBODY (ROUTINE TESTING W REFLEX): HIV 1&2 Ab, 4th Generation: NONREACTIVE

## 2020-01-31 MED ORDER — VITAMIN B-12 100 MCG PO TABS: 100.0000 ug | ORAL_TABLET | Freq: Every day | ORAL | 4 refills | Status: DC

## 2020-01-31 NOTE — Telephone Encounter (Signed)
Patient calling stating that he got his folate and B12 yesterday and got results yesterday - patient requesting Dr to look at results and give him a call to discuss. Patient ph# [phone].

## 2020-01-31 NOTE — Addendum Note (Signed)
Addended by: Jon Billings on: 01/31/2020 02:01 PM   Modules accepted: Orders

## 2020-01-31 NOTE — Telephone Encounter (Signed)
Notified patient, he would like to get started on the injections as soon as he can, he said his feet are very very cold and it is worrying him, he read that cold feet can be related to low B12.

## 2020-01-31 NOTE — Telephone Encounter (Signed)
OK, let's have him over for the first injection.  We can then give him an injection a week for another 3 weeks, after which, we can follow with injections monthly for 6 months

## 2020-01-31 NOTE — Telephone Encounter (Signed)
M, I saw the results and he appears to have a low B12.  My suggestion would be for B12 injections monthly versus 5000 mcg B12 daily with a recheck of his level in 2 months.  However, his PCP did not review the results yet and I would suggest to also wait for his input.

## 2020-02-02 ENCOUNTER — Other Ambulatory Visit: Payer: Self-pay

## 2020-02-02 ENCOUNTER — Ambulatory Visit (INDEPENDENT_AMBULATORY_CARE_PROVIDER_SITE_OTHER): Payer: BC Managed Care – PPO

## 2020-02-02 DIAGNOSIS — E538 Deficiency of other specified B group vitamins: Secondary | ICD-10-CM

## 2020-02-02 MED ORDER — CYANOCOBALAMIN 1000 MCG/ML IJ SOLN
1000.0000 ug | Freq: Once | INTRAMUSCULAR | Status: AC
  Administered 2020-02-02: 1000 ug via INTRAMUSCULAR

## 2020-02-02 NOTE — Progress Notes (Signed)
Per orders of Dr. Gherghe injection of B12 given today by Melissa,Certified Medical Assistant. Patient tolerated injection well.  

## 2020-02-07 ENCOUNTER — Telehealth: Payer: Self-pay | Admitting: Family Medicine

## 2020-02-07 NOTE — Telephone Encounter (Signed)
Spoke with patient who sates that he had a B12 injection on 02/02/20 patient not sure if is supposed to continue injections, also would like to know if Dr. Ethelene Hal would like to increase medication for B12 due to patient still having numbness in feet and cold feeling in feet. Please advise.

## 2020-02-07 NOTE — Telephone Encounter (Signed)
Patient is calling and wanted to speak to someone regarding medication. CB is [phone]

## 2020-02-08 NOTE — Telephone Encounter (Signed)
Patient verbally understood message below.

## 2020-02-08 NOTE — Telephone Encounter (Signed)
No change in dosage. If his symptoms are going to respond, it may take a while. We will recheck in June as planned.

## 2020-02-14 ENCOUNTER — Telehealth: Payer: Self-pay | Admitting: Internal Medicine

## 2020-02-14 ENCOUNTER — Encounter: Payer: Self-pay | Admitting: Gastroenterology

## 2020-02-14 DIAGNOSIS — H40023 Open angle with borderline findings, high risk, bilateral: Secondary | ICD-10-CM | POA: Diagnosis not present

## 2020-02-14 NOTE — Telephone Encounter (Signed)
Patient called to ask about B12 injection and when we should schedule his next injection. Also wanted to advise that his feet are still cold, but getting better since 1st injection.  Also wanted to advise per Dr Ethelene Hal he is taking 100 mcg of B-12 per day - he is asking if this is an appropriate dosage and if he needs to take more or be titrated to a more accurate dose?  Please advise at [phone]

## 2020-02-15 NOTE — Telephone Encounter (Signed)
I am glad he is feeling better. If he is getting B12 injections, he does not need po B12, but will need likely 1000 mcg daily after he finishes injections, possibly after 6 mo

## 2020-02-29 ENCOUNTER — Ambulatory Visit (INDEPENDENT_AMBULATORY_CARE_PROVIDER_SITE_OTHER): Payer: BC Managed Care – PPO | Admitting: Podiatry

## 2020-02-29 ENCOUNTER — Encounter: Payer: Self-pay | Admitting: Podiatry

## 2020-02-29 ENCOUNTER — Other Ambulatory Visit: Payer: Self-pay

## 2020-02-29 VITALS — BP 137/90 | HR 88 | Temp 97.7°F | Resp 16

## 2020-02-29 DIAGNOSIS — B351 Tinea unguium: Secondary | ICD-10-CM | POA: Diagnosis not present

## 2020-02-29 DIAGNOSIS — L600 Ingrowing nail: Secondary | ICD-10-CM

## 2020-02-29 DIAGNOSIS — L853 Xerosis cutis: Secondary | ICD-10-CM | POA: Diagnosis not present

## 2020-02-29 MED ORDER — CEPHALEXIN 500 MG PO CAPS: 500.0000 mg | ORAL_CAPSULE | Freq: Three times a day (TID) | ORAL | 0 refills | Status: DC

## 2020-02-29 MED ORDER — AMMONIUM LACTATE 12 % EX CREA: TOPICAL_CREAM | CUTANEOUS | 0 refills | Status: AC | PRN

## 2020-02-29 NOTE — Patient Instructions (Signed)

## 2020-03-04 DIAGNOSIS — L600 Ingrowing nail: Secondary | ICD-10-CM | POA: Insufficient documentation

## 2020-03-04 NOTE — Progress Notes (Signed)
Subjective:   Patient ID: Terry Bonilla, male   DOB: 49 y.o.   MRN: [id number]   HPI 49 year old male presents the office today for concerns of ingrown toenail right big toe, lateral aspect seen on the last 2 weeks and getting worse.  He tried soaking in hot water which has been helpful some.  He states that the nail became bruised in September 2020 while playing golf and the nail came off.  Denies any drainage or pus.  Also gets calluses, dry skin to his heels.  No open sores.  Also has noticed that the left big toe at the tip has become discolored last 4 to 5 months.  No drainage or pus.   Review of Systems  All other systems reviewed and are negative.  Past Medical History:  Diagnosis Date  . Diabetes mellitus without complication ([place])     No past surgical history on file.   Current Outpatient Medications:  .  atorvastatin (LIPITOR) 10 MG tablet, Take 1 tablet (10 mg total) by mouth daily. (Patient taking differently: Take 10 mg by mouth daily. Takes at night), Disp: 90 tablet, Rfl: 3 .  betamethasone dipropionate 0.05 % cream, Apply 1 application topically as needed., Disp: , Rfl:  .  finasteride (PROPECIA) 1 MG tablet, Take 1 tablet by mouth daily., Disp: , Rfl:  .  glucose blood (TRUE METRIX BLOOD GLUCOSE TEST) test strip, Use to test blood sugars 1-2 times daily., Disp: 100 each, Rfl: 3 .  meclizine (ANTIVERT) 25 MG tablet, Take 1 tablet (25 mg total) by mouth 3 (three) times daily as needed for dizziness., Disp: 30 tablet, Rfl: 0 .  metFORMIN (GLUCOPHAGE-XR) 750 MG 24 hr tablet, Take 1500 mg by mouth with dinner (Patient taking differently: Take 750 mg by mouth. Take 1 tablet with breakfast and 1 tablet with dinner.), Disp: 180 tablet, Rfl: 3 .  Travoprost, BAK Free, (TRAVATAN) 0.004 % SOLN ophthalmic solution, 1 drop at bedtime., Disp: , Rfl:  .  triamcinolone (KENALOG) 0.025 % ointment, Apply 1 application topically 2 (two) times daily. Not for face or private areas., Disp: 80  g, Rfl: 0 .  TRUEplus Lancets 30G MISC, Use to test blood sugars 1-2 times daily., Disp: 100 each, Rfl: 3 .  UNABLE TO FIND, Gets Vitamin B-12 i4/njections  Last injection was on 02/02/20; next one is 03/05/20  Doing for the next 6 months, Disp: , Rfl:  .  vitamin B-12 (CYANOCOBALAMIN) 100 MCG tablet, Take 1 tablet (100 mcg total) by mouth daily. (Patient taking differently: Take 100 mcg by mouth daily. Does not take currently due to getting B12 injections), Disp: 100 tablet, Rfl: 4 .  ammonium lactate (AMLACTIN) 12 % cream, Apply topically as needed for dry skin., Disp: 385 g, Rfl: 0 .  cephALEXin (KEFLEX) 500 MG capsule, Take 1 capsule (500 mg total) by mouth 3 (three) times daily., Disp: 21 capsule, Rfl: 0  Allergies  Allergen Reactions  . Crab [Shellfish Allergy] Hives  . Sulfa Antibiotics Hives  . Sulfur Rash         Objective:  Physical Exam  General: AAO x3, NAD  Dermatological: Incurvation present to lateral aspect of right hallux toenail with tenderness palpation is localized edema erythema to the nail corner.  There is no ascending cellulitis there is no drainage or pus identified today.  On the left hallux to the distal aspect there is slight yellow and brown discoloration.  No hyperpigmentation changes.  There is no pain or  signs of bacterial infection.  Dry skin present of the heels without any open sores.  Vascular: Dorsalis Pedis artery and Posterior Tibial artery pedal pulses are 2/4 bilateral with immedate capillary fill time.  There is no pain with calf compression, swelling, warmth, erythema.   Neruologic: Grossly intact via light touch bilateral.  Musculoskeletal: No gross boney pedal deformities bilateral. No pain, crepitus, or limitation noted with foot and ankle range of motion bilateral. Muscular strength 5/5 in all groups tested bilateral.  Gait: Unassisted, Nonantalgic.       Assessment:   Ingrown toenail right lateral hallux; onychomycosis; dry skin      Plan:  -Treatment options discussed including all alternatives, risks, and complications -Etiology of symptoms were discussed -At this time, the patient is requesting partial nail removal with chemical matricectomy to the symptomatic portion of the nail. Risks and complications were discussed with the patient for which they understand and written consent was obtained. Under sterile conditions a total of 3 mL of a mixture of 2% lidocaine plain and 0.5% Marcaine plain was infiltrated in a hallux block fashion. Once anesthetized, the skin was prepped in sterile fashion. A tourniquet was then applied. Next the lateral aspect of hallux nail border was then sharply excised making sure to remove the entire offending nail border. Once the nails were ensured to be removed area was debrided and the underlying skin was intact. There is no purulence identified in the procedure. Next phenol was then applied under standard conditions and copiously irrigated. Silvadene was applied. A dry sterile dressing was applied. After application of the dressing the tourniquet was removed and there is found to be an immediate capillary refill time to the digit. The patient tolerated the procedure well any complications. Post procedure instructions were discussed the patient for which he verbally understood. Follow-up in one week for nail check or sooner if any problems are to arise. Discussed signs/symptoms of infection and directed to call the office immediately should any occur or go directly to the emergency room. In the meantime, encouraged to call the office with any questions, concerns, changes symptoms. Keflex -Amlactin for dry skin -Discussed fungi-nail for the nail fungus.     Trula Slade DPM

## 2020-03-05 ENCOUNTER — Ambulatory Visit (INDEPENDENT_AMBULATORY_CARE_PROVIDER_SITE_OTHER): Payer: BC Managed Care – PPO

## 2020-03-05 ENCOUNTER — Other Ambulatory Visit: Payer: Self-pay

## 2020-03-05 DIAGNOSIS — E538 Deficiency of other specified B group vitamins: Secondary | ICD-10-CM

## 2020-03-05 MED ORDER — CYANOCOBALAMIN 1000 MCG/ML IJ SOLN
1000.0000 ug | Freq: Once | INTRAMUSCULAR | Status: AC
  Administered 2020-03-05: 1000 ug via INTRAMUSCULAR

## 2020-03-05 NOTE — Progress Notes (Signed)
Per orders of Cristina Gherghe, MD  injection of B12 given today by Yaneisy Wenz,Certified Medical Assistant.  Patient tolerated injection well.  

## 2020-03-07 DIAGNOSIS — G4733 Obstructive sleep apnea (adult) (pediatric): Secondary | ICD-10-CM | POA: Diagnosis not present

## 2020-03-11 ENCOUNTER — Encounter: Payer: Self-pay | Admitting: Podiatry

## 2020-03-11 ENCOUNTER — Ambulatory Visit (INDEPENDENT_AMBULATORY_CARE_PROVIDER_SITE_OTHER): Payer: BC Managed Care – PPO | Admitting: Podiatry

## 2020-03-11 ENCOUNTER — Other Ambulatory Visit: Payer: Self-pay

## 2020-03-11 DIAGNOSIS — L600 Ingrowing nail: Secondary | ICD-10-CM

## 2020-03-11 NOTE — Patient Instructions (Signed)

## 2020-03-12 NOTE — Progress Notes (Signed)
Subjective: Terry Bonilla is a 49 y.o.  male returns to office today for follow up evaluation after having right Hallux lateral partial nail avulsion performed. Patient has been soaking using epsom salts once a day followed by applying topical antibiotic covered with bandaid daily. Patient denies fevers, chills, nausea, vomiting. Denies any calf pain, chest pain, SOB.   Objective:  Vitals: Reviewed  General: Well developed, nourished, in no acute distress, alert and oriented x3   Dermatology: Skin is warm, dry and supple bilateral. RIGHT hallux nail border appears to be clean, dry, with mild granular tissue and surrounding scab. There is no surrounding erythema, edema, drainage/purulence.  The nail itself is dystrophic and there is transverse ridging within the nail.  The remaining nails appear unremarkable at this time. There are no other lesions or other signs of infection present.  Neurovascular status: Intact. No lower extremity swelling; No pain with calf compression bilateral.  Musculoskeletal: No tenderness to palpation of the right hallux nail fold. Muscular strength within normal limits bilateral.   Assesement and Plan: S/p partial nail avulsion, doing well.   -Continue soaking in epsom salts twice a day followed by antibiotic ointment and a band-aid. Can leave uncovered at night. Continue this until completely healed.  -If the area has not healed in 2 weeks, call the office for follow-up appointment, or sooner if any problems arise.  -Discussed with rheumatology, symptomatic pain to the nail removed given the dystrophy but currently not causing problems we will continue to monitor. -Monitor for any signs/symptoms of infection. Call the office immediately if any occur or go directly to the emergency room. Call with any questions/concerns.  Celesta Gentile, DPM

## 2020-03-13 ENCOUNTER — Other Ambulatory Visit: Payer: Self-pay

## 2020-03-13 ENCOUNTER — Ambulatory Visit (AMBULATORY_SURGERY_CENTER): Payer: Self-pay

## 2020-03-13 VITALS — Temp 97.7°F | Ht 75.0 in | Wt 271.0 lb

## 2020-03-13 DIAGNOSIS — Z01818 Encounter for other preprocedural examination: Secondary | ICD-10-CM

## 2020-03-13 DIAGNOSIS — Z1211 Encounter for screening for malignant neoplasm of colon: Secondary | ICD-10-CM

## 2020-03-13 MED ORDER — NA SULFATE-K SULFATE-MG SULF 17.5-3.13-1.6 GM/177ML PO SOLN: 1.0000 | Freq: Once | ORAL | 0 refills | Status: AC

## 2020-03-13 NOTE — Progress Notes (Signed)
No egg or soy allergy known to patient  No issues with past sedation with any surgeries  or procedures, no intubation problems  No diet pills per patient No home 02 use per patient  No blood thinners per patient  Pt denies issues with constipation  No A fib or A flutter  EMMI video sent to pt's e mail   suprep coupon and code given  Due to the COVID-19 pandemic we are asking patients to follow these guidelines. Please only bring one care partner. Please be aware that your care partner may wait in the car in the parking lot or if they feel like they will be too hot to wait in the car, they may wait in the lobby on the 4th floor. All care partners are required to wear a mask the entire time (we do not have any that we can provide them), they need to practice social distancing, and we will do a Covid check for all patient's and care partners when you arrive. Also we will check their temperature and your temperature. If the care partner waits in their car they need to stay in the parking lot the entire time and we will call them on their cell phone when the patient is ready for discharge so they can bring the car to the front of the building. Also all patient's will need to wear a mask into building.  

## 2020-03-22 ENCOUNTER — Ambulatory Visit (INDEPENDENT_AMBULATORY_CARE_PROVIDER_SITE_OTHER): Payer: BC Managed Care – PPO

## 2020-03-22 ENCOUNTER — Other Ambulatory Visit: Payer: Self-pay | Admitting: Gastroenterology

## 2020-03-22 DIAGNOSIS — Z1159 Encounter for screening for other viral diseases: Secondary | ICD-10-CM

## 2020-03-22 LAB — SARS CORONAVIRUS 2 (TAT 6-24 HRS): SARS Coronavirus 2: NEGATIVE

## 2020-03-27 ENCOUNTER — Other Ambulatory Visit: Payer: Self-pay

## 2020-03-27 ENCOUNTER — Encounter: Payer: Self-pay | Admitting: Gastroenterology

## 2020-03-27 ENCOUNTER — Ambulatory Visit (AMBULATORY_SURGERY_CENTER): Payer: BC Managed Care – PPO | Admitting: Gastroenterology

## 2020-03-27 VITALS — BP 143/92 | HR 77 | Temp 98.2°F | Resp 12 | Ht 75.0 in | Wt 271.0 lb

## 2020-03-27 DIAGNOSIS — D120 Benign neoplasm of cecum: Secondary | ICD-10-CM

## 2020-03-27 DIAGNOSIS — D122 Benign neoplasm of ascending colon: Secondary | ICD-10-CM

## 2020-03-27 DIAGNOSIS — Z1211 Encounter for screening for malignant neoplasm of colon: Secondary | ICD-10-CM

## 2020-03-27 MED ORDER — SODIUM CHLORIDE 0.9 % IV SOLN: 500.0000 mL | Freq: Once | INTRAVENOUS | Status: DC

## 2020-03-27 NOTE — Progress Notes (Signed)
Report given to PACU, vss 

## 2020-03-27 NOTE — Progress Notes (Signed)
No problems noted in the recovery room. maw 

## 2020-03-27 NOTE — Progress Notes (Signed)
Called to room to assist during endoscopic procedure.  Patient ID and intended procedure confirmed with present staff. Received instructions for my participation in the procedure from the performing physician.  

## 2020-03-27 NOTE — Progress Notes (Signed)
Temp check by:LS Vital check by:CW  The patient states no changes in medical or surgical history since pre-visit screening on 03/13/20.

## 2020-03-27 NOTE — Op Note (Signed)
[place] Patient Name: Terry Bonilla Procedure Date: 03/27/2020 10:39 AM MRN: [id number] Endoscopist: Jackquline Denmark , MD Age: 49 Referring MD:  Date of Birth: Feb 06, 1971 Gender: Male Account #: [id number] Procedure:                Colonoscopy Indications:              Screening for colorectal malignant neoplasm Medicines:                Monitored Anesthesia Care Procedure:                Pre-Anesthesia Assessment:                           - Prior to the procedure, a History and Physical                            was performed, and patient medications and                            allergies were reviewed. The patient's tolerance of                            previous anesthesia was also reviewed. The risks                            and benefits of the procedure and the sedation                            options and risks were discussed with the patient.                            All questions were answered, and informed consent                            was obtained. Prior Anticoagulants: The patient has                            taken no previous anticoagulant or antiplatelet                            agents. ASA Grade Assessment: II - A patient with                            mild systemic disease. After reviewing the risks                            and benefits, the patient was deemed in                            satisfactory condition to undergo the procedure.                           After obtaining informed consent, the colonoscope  was passed under direct vision. Throughout the                            procedure, the patient's blood pressure, pulse, and                            oxygen saturations were monitored continuously. The                            Colonoscope was introduced through the anus and                            advanced to the 1 cm into the ileum. The                            colonoscopy was performed without  difficulty. The                            patient tolerated the procedure well. The quality                            of the bowel preparation was good. The ileocecal                            valve, appendiceal orifice, and rectum were                            photographed. Scope In: 10:50:57 AM Scope Out: 11:03:45 AM Scope Withdrawal Time: 0 hours 10 minutes 20 seconds  Total Procedure Duration: 0 hours 12 minutes 48 seconds  Findings:                 A 15 mm polyp was found in the proximal ascending                            colon. The polyp was pedunculated. The polyp was                            removed with a hot snare, retrieved by [employer].                            Resection and retrieval were complete.                           Non-bleeding internal hemorrhoids were found during                            retroflexion. The hemorrhoids were small.                           The terminal ileum appeared normal.                           The exam was otherwise without abnormality on  direct and retroflexion views. Complications:            No immediate complications. Estimated Blood Loss:     Estimated blood loss: none. Impression:               -Large colonic polyp s/p polypectomy.                           -Small non-bleeding internal hemorrhoids.                           -Otherwise normal colonoscopy to TI. Recommendation:           - Patient has a contact number available for                            emergencies. The signs and symptoms of potential                            delayed complications were discussed with the                            patient. Return to normal activities tomorrow.                            Written discharge instructions were provided to the                            patient.                           - Resume previous diet.                           - No aspirin, ibuprofen, naproxen, or other                             non-steroidal anti-inflammatory drugs for 5 days                            after polyp removal.                           - Await pathology results.                           - Repeat colonoscopy for surveillance based on                            pathology results.                           - Return to GI clinic PRN.                           - D/W pt in detail. Jackquline Denmark, MD 03/27/2020 11:08:02 AM This report has been signed electronically.

## 2020-03-27 NOTE — Patient Instructions (Addendum)
YOU HAD AN ENDOSCOPIC PROCEDURE TODAY AT [facility]:   Refer to the procedure report that was given to you for any specific questions about what was found during the examination.  If the procedure report does not answer your questions, please call your gastroenterologist to clarify.  If you requested that your care partner not be given the details of your procedure findings, then the procedure report has been included in a sealed envelope for you to review at your convenience later.  YOU SHOULD EXPECT: Some feelings of bloating in the abdomen. Passage of more gas than usual.  Walking can help get rid of the air that was put into your GI tract during the procedure and reduce the bloating. If you had a lower endoscopy (such as a colonoscopy or flexible sigmoidoscopy) you may notice spotting of blood in your stool or on the toilet paper. If you underwent a bowel prep for your procedure, you may not have a normal bowel movement for a few days.  Please Note:  You might notice some irritation and congestion in your nose or some drainage.  This is from the oxygen used during your procedure.  There is no need for concern and it should clear up in a day or so.  SYMPTOMS TO REPORT IMMEDIATELY:   Following lower endoscopy (colonoscopy or flexible sigmoidoscopy):  Excessive amounts of blood in the stool  Significant tenderness or worsening of abdominal pains  Swelling of the abdomen that is new, acute  Fever of 100F or higher   For urgent or emergent issues, a gastroenterologist can be reached at any hour by calling [phone]. Do not use MyChart messaging for urgent concerns.    DIET:  We do recommend a small meal at first, but then you may proceed to your regular diet.  Drink plenty of fluids but you should avoid alcoholic beverages for 24 hours.  ACTIVITY:  You should plan to take it easy for the rest of today and you should NOT DRIVE or use heavy machinery until tomorrow (because  of the sedation medicines used during the test).    FOLLOW UP: Our staff will call the number listed on your records 48-72 hours following your procedure to check on you and address any questions or concerns that you may have regarding the information given to you following your procedure. If we do not reach you, we will leave a message.  We will attempt to reach you two times.  During this call, we will ask if you have developed any symptoms of COVID 19. If you develop any symptoms (ie: fever, flu-like symptoms, shortness of breath, cough etc.) before then, please call [phone].  If you test positive for Covid 19 in the 2 weeks post procedure, please call and report this information to [name].    If any biopsies were taken you will be contacted by phone or by letter within the next 1-3 weeks.  Please call us at [phone] if you have not heard about the biopsies in 3 weeks.    SIGNATURES/CONFIDENTIALITY: You and/or your care partner have signed paperwork which will be entered into your electronic medical record.  These signatures attest to the fact that that the information above on your After Visit Summary has been reviewed and is understood.  Full responsibility of the confidentiality of this discharge information lies with you and/or your care-partner.     Handouts were given to you on polyps and hemorrhoids. Your blood sugar was 140  in the recovery room.  NO ASPIRIN, ASPIRIN CONTAINING PRODUCTS (BC OR GOODY POWDERS) OR NSAIDS (IBUPROFEN, ADVIL, ALEVE, AND MOTRIN) FOR 5 DAYS; TYLENOL IS OK TO TAKE You may resume your other current medications today. Await biopsy results. Please call if any questions or concerns.

## 2020-03-28 ENCOUNTER — Other Ambulatory Visit: Payer: Self-pay

## 2020-03-29 ENCOUNTER — Telehealth: Payer: Self-pay

## 2020-03-29 NOTE — Telephone Encounter (Signed)
  Follow up Call-  Call back number 03/27/2020  Post procedure Call Back phone  # [phone]  Permission to leave phone message Yes  Some recent data might be hidden     Patient questions:  Do you have a fever, pain , or abdominal swelling? No. Pain Score  0 *  Have you tolerated food without any problems? Yes.    Have you been able to return to your normal activities? Yes.    Do you have any questions about your discharge instructions: Diet   No. Medications  No. Follow up visit  No.  Do you have questions or concerns about your Care? No.  Actions: * If pain score is 4 or above: No action needed, pain <4. 1. Have you developed a fever since your procedure? no  2.   Have you had an respiratory symptoms (SOB or cough) since your procedure? no  3.   Have you tested positive for COVID 19 since your procedure no  4.   Have you had any family members/close contacts diagnosed with the COVID 19 since your procedure?  no   If yes to any of these questions please route to Joylene John, RN and Erenest Rasher, RN

## 2020-03-29 NOTE — Telephone Encounter (Signed)
1st follow up call made.  NALM 

## 2020-04-01 ENCOUNTER — Other Ambulatory Visit: Payer: Self-pay

## 2020-04-01 ENCOUNTER — Ambulatory Visit (INDEPENDENT_AMBULATORY_CARE_PROVIDER_SITE_OTHER): Payer: BC Managed Care – PPO | Admitting: Internal Medicine

## 2020-04-01 ENCOUNTER — Encounter: Payer: Self-pay | Admitting: Internal Medicine

## 2020-04-01 VITALS — BP 118/80 | HR 86 | Ht 75.0 in | Wt 272.0 lb

## 2020-04-01 DIAGNOSIS — E785 Hyperlipidemia, unspecified: Secondary | ICD-10-CM | POA: Diagnosis not present

## 2020-04-01 DIAGNOSIS — E538 Deficiency of other specified B group vitamins: Secondary | ICD-10-CM

## 2020-04-01 DIAGNOSIS — E1140 Type 2 diabetes mellitus with diabetic neuropathy, unspecified: Secondary | ICD-10-CM | POA: Diagnosis not present

## 2020-04-01 LAB — POCT GLYCOSYLATED HEMOGLOBIN (HGB A1C): Hemoglobin A1C: 6.6 % — AB (ref 4.0–5.6)

## 2020-04-01 MED ORDER — CYANOCOBALAMIN 1000 MCG/ML IJ SOLN
1000.0000 ug | Freq: Once | INTRAMUSCULAR | Status: AC
  Administered 2020-04-01: 1000 ug via INTRAMUSCULAR

## 2020-04-01 NOTE — Addendum Note (Signed)
Addended by: Cardell Peach I on: 04/01/2020 08:33 AM   Modules accepted: Orders

## 2020-04-01 NOTE — Progress Notes (Signed)
Per orders of Dr. Gherghe injection of B12 given today by Jaasia Viglione,Certified Medical Assistant. Patient tolerated injection well.  

## 2020-04-01 NOTE — Patient Instructions (Addendum)
Please continue: - Metformin ER 750 mg 2x a day  Please return in 4 months with your sugar log.

## 2020-04-01 NOTE — Progress Notes (Signed)
Patient ID: Terry Bonilla, male   DOB: 25-Dec-1970, 49 y.o.   MRN: [id number]   This visit occurred during the SARS-CoV-2 public health emergency.  Safety protocols were in place, including screening questions prior to the visit, additional usage of staff PPE, and extensive cleaning of exam room while observing appropriate contact time as indicated for disinfecting solutions.    HPI: Terry Bonilla is a 49 y.o.-year-old male, referred by his PCP, Dr. Ethelene Bonilla, for management of DM2, dx on 10/15/2019, non-insulin-dependent, uncontrolled, with long-term complications (peripheral neuropathy).  Last visit 3 months ago.  Reviewed history: He was diagnosed with diabetes in 09/2019 after he presented to the ED with a syncopal episode.  In ED, glucose was 372 and he had glycosuria on urinalysis.  HbA1c was high.  He was started on Metformin.  Reviewed HbA1c levels: Lab Results  Component Value Date   HGBA1C 6.4 (A) 01/02/2020   HGBA1C 10.0 (H) 10/23/2019   Pt is on a regimen of:  - Metformin ER 750 mg 1x a day >> 1500 mg with dinner >> lightheadedness >> 750 mg 2x a day- still some ligtheadedness He had palpitations with an instant release Metformin.  Pt checks his sugars twice a day: - am: 145-172, 207 >> 125-158, 177, 184 >> ave: 140 - range: 91, 125-155, 185 - 2h after b'fast: n/c - before lunch: n/c - 2h after lunch: n/c - before dinner: n/c >> 72-155 >> 130-145 - 2h after dinner: n/c - bedtime: 130, 141-169, 209 >> n/c - nighttime: n/c Lowest sugar was 130 >> 72 >> 91; it is unclear at which level he has hypoglycemia awareness Highest sugar was 209 >> 209 >> 184 >> 185.  Glucometer: [employer] brand  Pt's meals are better after his dx: - Breakfast: [place] links - Lunch: chicken and broccoli - pm: nuts - Dinner: chisken - Snacks: whole wheat + History of drinking sweet drinks, but stopped.  -No CKD, last BUN/creatinine:  Lab Results  Component Value Date   BUN 13 11/28/2019   BUN 18  10/23/2019   CREATININE 0.80 11/28/2019   CREATININE 0.66 10/23/2019   -+ HL; last set of lipids: Lab Results  Component Value Date   CHOL 160 10/23/2019   HDL 27.30 (L) 10/23/2019   LDLCALC 113 (H) 10/23/2019   LDLDIRECT 118.0 10/23/2019   TRIG 97.0 10/23/2019   CHOLHDL 6 10/23/2019   - last eye exam was in 11/2019: No DR. He is on Travaprost for increased pressure.  - + numbness and tingling in his feet. He is seen by podiatry. He had ingrown toenail sx >> is not walking much.  Pt has FH of DM in elder brother.  He also has a history of alcohol use and transaminitis.  He also has dizziness, which improved but it is still present.  He also has B12 def.: Lab Results  Component Value Date   VITAMINB12 196 (L) 01/30/2020   We started B12 inj's >> had 2 injections so far and will get another one today.  His shooting pains in feet improved on B12.  ROS: Constitutional: no weight gain/no weight loss, no fatigue, no subjective hyperthermia, no subjective hypothermia Eyes: no blurry vision, no xerophthalmia ENT: no sore throat, no nodules palpated in neck, no dysphagia, no odynophagia, no hoarseness Cardiovascular: no CP/no SOB/no palpitations/no leg swelling Respiratory: no cough/no SOB/no wheezing Gastrointestinal: no N/no V/no D/no C/no acid reflux Musculoskeletal: no muscle aches/no joint aches Skin: no rashes, no hair loss Neurological:  no tremors/+ numbness/no tingling/+ dizziness  I reviewed pt's medications, allergies, PMH, social hx, family hx, and changes were documented in the history of present illness. Otherwise, unchanged from my initial visit note.  Past Medical History:  Diagnosis Date  . Diabetes mellitus without complication ([place]) [id number]   pre-diabetes A1C 6.4  . Sleep apnea 2009   cpap  . Substance abuse ([place])    ETOH   Past Surgical History:  Procedure Laterality Date  . TONSILLECTOMY     age 21   Social History   Socioeconomic History  .  Marital status: Unknown    Spouse name: Not on file  . Number of children: Not on file  . Years of education: Not on file  . Highest education level: Not on file  Occupational History  . Not on file  Tobacco Use  . Smoking status: Never Smoker  . Smokeless tobacco: Never Used  Substance and Sexual Activity  . Alcohol use: Yes    Alcohol/week: 2.0 standard drinks    Types: 1 Cans of beer, 1 Glasses of wine per week  . Drug use: Never  . Sexual activity: Not on file  Other Topics Concern  . Not on file  Social History Narrative  . Not on file   Social Determinants of Health   Financial Resource Strain:   . Difficulty of Paying Living Expenses:   Food Insecurity:   . Worried About Charity fundraiser in the Last Year:   . Arboriculturist in the Last Year:   Transportation Needs:   . Film/video editor (Medical):   Marland Kitchen Lack of Transportation (Non-Medical):   Physical Activity:   . Days of Exercise per Week:   . Minutes of Exercise per Session:   Stress:   . Feeling of Stress :   Social Connections:   . Frequency of Communication with Friends and Family:   . Frequency of Social Gatherings with Friends and Family:   . Attends Religious Services:   . Active Member of Clubs or Organizations:   . Attends Archivist Meetings:   Marland Kitchen Marital Status:   Intimate Partner Violence:   . Fear of Current or Ex-Partner:   . Emotionally Abused:   Marland Kitchen Physically Abused:   . Sexually Abused:    Current Outpatient Medications on File Prior to Visit  Medication Sig Dispense Refill  . ammonium lactate (AMLACTIN) 12 % cream Apply topically as needed for dry skin. 385 g 0  . atorvastatin (LIPITOR) 10 MG tablet Take 1 tablet (10 mg total) by mouth daily. (Patient taking differently: Take 10 mg by mouth daily. Takes at night) 90 tablet 3  . betamethasone dipropionate 0.05 % cream Apply 1 application topically as needed.    . finasteride (PROPECIA) 1 MG tablet Take 1 tablet by mouth  daily.    Marland Kitchen glucose blood (TRUE METRIX BLOOD GLUCOSE TEST) test strip Use to test blood sugars 1-2 times daily. 100 each 3  . meclizine (ANTIVERT) 25 MG tablet Take 1 tablet (25 mg total) by mouth 3 (three) times daily as needed for dizziness. 30 tablet 0  . metFORMIN (GLUCOPHAGE-XR) 750 MG 24 hr tablet Take 1500 mg by mouth with dinner (Patient taking differently: Take 750 mg by mouth. Take 1 tablet with breakfast and 1 tablet with dinner.) 180 tablet 3  . Travoprost, BAK Free, (TRAVATAN) 0.004 % SOLN ophthalmic solution 1 drop at bedtime.    . triamcinolone (KENALOG) 0.025 % ointment Apply  1 application topically 2 (two) times daily. Not for face or private areas. 80 g 0  . TRUEplus Lancets 30G MISC Use to test blood sugars 1-2 times daily. 100 each 3  . UNABLE TO FIND Gets Vitamin B-12 i4/njections  Last injection was on 02/02/20; next one is 03/05/20  Doing for the next 6 months     No current facility-administered medications on file prior to visit.   Allergies  Allergen Reactions  . Crab [Shellfish Allergy] Hives  . Sulfa Antibiotics Hives  . Sulfur Rash   PMH: -Pertinent PMH reviewed in HPI  PE:  BP 118/80   Pulse 86   Ht 6\' 3"  (1.905 m)   Wt 272 lb (123.4 kg)   SpO2 98%   BMI 34.00 kg/m  Wt Readings from Last 3 Encounters:  04/01/20 272 lb (123.4 kg)  03/27/20 271 lb (122.9 kg)  03/13/20 271 lb (122.9 kg)   Constitutional: overweight, in NAD Eyes: PERRLA, EOMI, no exophthalmos ENT: moist mucous membranes, no thyromegaly, no cervical lymphadenopathy Cardiovascular: RRR, No MRG Respiratory: CTA B Gastrointestinal: abdomen soft, NT, ND, BS+ Musculoskeletal: no deformities, strength intact in all 4 Skin: moist, warm, no rashes Neurological: no tremor with outstretched hands, DTR normal in all 4  ASSESSMENT: 1. DM2, non-insulin-dependent, uncontrolled, with complications - PN  2. HL  3.  Vitamin B12 deficiency  PLAN:  1. Patient with relatively recent  diagnosis of diabetes, after he was found to have high blood sugars and high HbA1c after syncopal episode in 09/2019.  He had no history of prediabetes or diabetes.  He was initially started on Metformin IR, which he could not tolerate his palpitations.  He was then switched to Metformin ER 750 milligrams daily, which he tolerated well.  Sugars improved, and we increase the Metformin dose to 1500 mg daily, with good tolerance.  We also discussed about improving his diet, which he already started to change after his diabetes diagnosis.  He eliminated sweet drinks and improved the quality of his meals.  He also saw nutrition.   -At last visit, HbA1c improved significantly to 6.4%.  Sugars were improved and mostly at or close to goal, with the exception of the times when he had bourbon or chocolate.  We discussed about reducing these and stopping snacking in general.  At that time, he had some dizziness, which he presumed to be from Metformin and I advised him to stay well-hydrated but to let me know if he still had dizziness, in which case, we had to decrease the Metformin dose, however, this improved on meclizine started the day before our last visit.  We did not have to decrease the Metformin dose since last visit.  At this visit, he still has some dizziness, although this has improved.  He is planning to see neurology soon. -At this visit, sugars are slightly above goal, but he is not active at all after his foot surgery.  However, he plans to start walking.  For now, we will continue the current regimen and will refrain from adding another antidiabetic regimen until his dizziness is resolved. - I suggested to:  Patient Instructions  Please continue: - Metformin ER 750 mg 2x a day  Please return in 3-4 months with your sugar log.   - we checked his HbA1c: 6.6% (slightly higher) - advised to check sugars at different times of the day - 1x a day, rotating check times - advised for yearly eye exams >> he  is UTD -  return to clinic in 3-4 months  2. HL -Reviewed latest lipid panel from 09/2019: LDL above goal, HDL also low: Lab Results  Component Value Date   CHOL 160 10/23/2019   HDL 27.30 (L) 10/23/2019   LDLCALC 113 (H) 10/23/2019   LDLDIRECT 118.0 10/23/2019   TRIG 97.0 10/23/2019   CHOLHDL 6 10/23/2019  -He is not on a statin  3.  Vitamin B12 deficiency -We started B12 injections at last visit, when B12 was found to be low -We will continue for 6 months, after which we will check his B12 level and then hopefully switch to p.o. supplementation  Philemon Kingdom, MD PhD [facility]

## 2020-04-02 ENCOUNTER — Ambulatory Visit: Payer: BC Managed Care – PPO

## 2020-04-04 ENCOUNTER — Encounter: Payer: Self-pay | Admitting: Gastroenterology

## 2020-04-12 ENCOUNTER — Other Ambulatory Visit: Payer: Self-pay

## 2020-04-12 ENCOUNTER — Encounter: Payer: Self-pay | Admitting: Podiatry

## 2020-04-12 ENCOUNTER — Ambulatory Visit (INDEPENDENT_AMBULATORY_CARE_PROVIDER_SITE_OTHER): Payer: BC Managed Care – PPO | Admitting: Podiatry

## 2020-04-12 VITALS — Temp 95.7°F

## 2020-04-12 DIAGNOSIS — L600 Ingrowing nail: Secondary | ICD-10-CM | POA: Diagnosis not present

## 2020-04-12 MED ORDER — CEPHALEXIN 500 MG PO CAPS: 500.0000 mg | ORAL_CAPSULE | Freq: Three times a day (TID) | ORAL | 0 refills | Status: DC

## 2020-04-12 NOTE — Patient Instructions (Signed)

## 2020-04-12 NOTE — Progress Notes (Signed)
Subjective: 49 year old male presents the office today for concerns of ingrown tender to the right medial hallux nail border.  Is noticed some redness and swelling to the area denies any drainage or pus.  This been on the last 1 to 2 weeks.  No recent treatment.  Has concerned about some ridging and thickening within the toenail itself.  The lateral aspect been doing well since then with a partial nail avulsion. Denies any systemic complaints such as fevers, chills, nausea, vomiting. No acute changes since last appointment, and no other complaints at this time.   Objective: AAO x3, NAD DP/PT pulses palpable bilaterally, CRT less than 3 seconds Incurvation present to the right medial hallux nail border with localized edema and erythema there is no drainage or pus or ascending cellulitis.  Right side is healed.  The distal two thirds of the nail.  To have transverse ridging within the nail as well as mild thickening and yellow/brown discoloration No open lesions or pre-ulcerative lesions.  No pain with calf compression, swelling, warmth, erythema  Assessment: 49 year old male with right medial hallux symptomatic ingrown toenail, onychodystrophy  Plan: -All treatment options discussed with the patient including all alternatives, risks, complications.  -At this time, the patient is requesting partial nail removal with chemical matricectomy to the symptomatic portion of the nail. Risks and complications were discussed with the patient for which they understand and written consent was obtained. Under sterile conditions a total of 3 mL of a mixture of 2% lidocaine plain and 0.5% Marcaine plain was infiltrated in a hallux block fashion. Once anesthetized, the skin was prepped in sterile fashion. A tourniquet was then applied. Next the medial aspect of hallux nail border was then sharply excised making sure to remove the entire offending nail border. Once the nails were ensured to be removed area was debrided  and the underlying skin was intact. There is no purulence identified in the procedure. Next phenol was then applied under standard conditions and copiously irrigated. Betaine ointment was applied. A dry sterile dressing was applied. After application of the dressing the tourniquet was removed and there is found to be an immediate capillary refill time to the digit. The patient tolerated the procedure well any complications. Post procedure instructions were discussed the patient for which he verbally understood. Follow-up in one week for nail check or sooner if any problems are to arise. Discussed signs/symptoms of infection and directed to call the office immediately should any occur or go directly to the emergency room. In the meantime, encouraged to call the office with any questions, concerns, changes symptoms. -Keflex -Once the nail is healed discussed possibly urea gel -Patient encouraged to call the office with any questions, concerns, change in symptoms.   Return in about 2 weeks (around 04/26/2020).  Trula Slade DPM

## 2020-04-15 ENCOUNTER — Telehealth: Payer: Self-pay | Admitting: Family Medicine

## 2020-04-15 DIAGNOSIS — G4733 Obstructive sleep apnea (adult) (pediatric): Secondary | ICD-10-CM | POA: Diagnosis not present

## 2020-04-15 NOTE — Telephone Encounter (Signed)
Patient called back and stated that he was referred to a neurologist and the are not available until August. Patient wanted to see if there was  another office he could be referred to be sooner. CB is [phone]

## 2020-04-15 NOTE — Telephone Encounter (Signed)
Patient aware.

## 2020-04-15 NOTE — Telephone Encounter (Signed)
Patient is deficient in B12 and we have started B12. This is a cause for his symptoms. There is nothing that neurology would do differently right now. August appointment is okay.

## 2020-04-15 NOTE — Telephone Encounter (Signed)
Dr. Ethelene Hal please advise message below. Any suggestions on any other neurologist?

## 2020-04-29 ENCOUNTER — Other Ambulatory Visit: Payer: Self-pay

## 2020-04-29 ENCOUNTER — Encounter: Payer: Self-pay | Admitting: Podiatry

## 2020-04-29 ENCOUNTER — Ambulatory Visit (INDEPENDENT_AMBULATORY_CARE_PROVIDER_SITE_OTHER): Payer: BC Managed Care – PPO | Admitting: Podiatry

## 2020-04-29 DIAGNOSIS — L600 Ingrowing nail: Secondary | ICD-10-CM | POA: Diagnosis not present

## 2020-04-29 DIAGNOSIS — L603 Nail dystrophy: Secondary | ICD-10-CM | POA: Diagnosis not present

## 2020-04-29 MED ORDER — UREA 40 % EX GEL: 1.0000 "application " | Freq: Every day | CUTANEOUS | 2 refills | Status: DC

## 2020-05-02 ENCOUNTER — Other Ambulatory Visit: Payer: Self-pay | Admitting: Family Medicine

## 2020-05-03 ENCOUNTER — Ambulatory Visit (INDEPENDENT_AMBULATORY_CARE_PROVIDER_SITE_OTHER): Payer: BC Managed Care – PPO

## 2020-05-03 ENCOUNTER — Other Ambulatory Visit: Payer: Self-pay

## 2020-05-03 DIAGNOSIS — E538 Deficiency of other specified B group vitamins: Secondary | ICD-10-CM | POA: Diagnosis not present

## 2020-05-03 MED ORDER — CYANOCOBALAMIN 1000 MCG/ML IJ SOLN
1000.0000 ug | Freq: Once | INTRAMUSCULAR | Status: AC
  Administered 2020-05-03: 1000 ug via INTRAMUSCULAR

## 2020-05-03 NOTE — Progress Notes (Signed)
Per orders of Philemon Kingdom, MD  injection of B12 given today by [facility] Medical Assistant.  Patient tolerated injection well.

## 2020-05-04 IMAGING — CT CT HEAD W/O CM
3 series · 15 of 47 positions shown, 18 images · non-contrast
Comparison: None.

CLINICAL DATA: Head trauma.  Headache.

EXAM:
CT HEAD WITHOUT CONTRAST
TECHNIQUE: Contiguous axial images were obtained from the base of the skull
through the vertex without intravenous contrast.

[Series 2: head wo · axial · 0.49mm/px · z∈[-165,-15]mm · 9 of 36 slices shown, 12 images]
[im 3/36  brain]
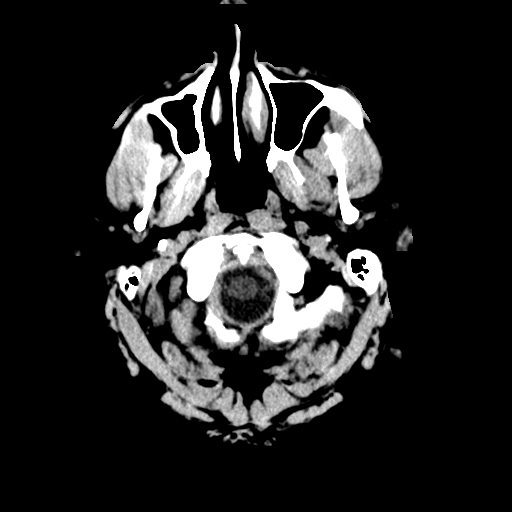
[im 3/36  bone]
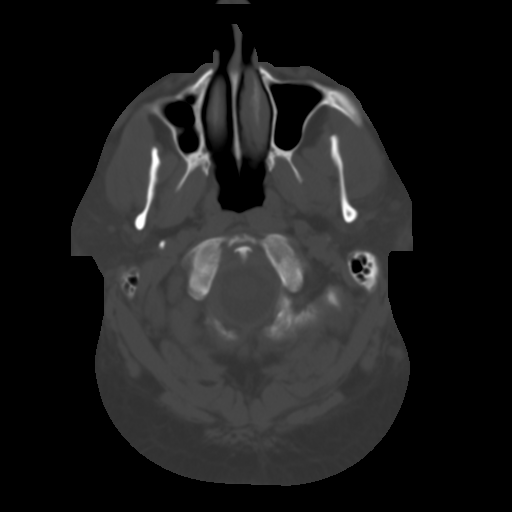
[im 7/36  brain]
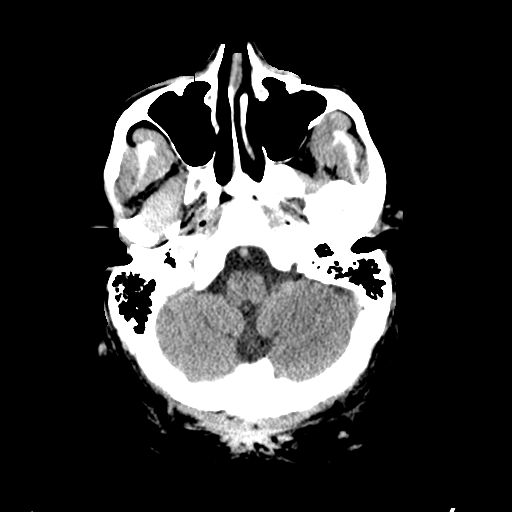
[im 10/36  brain]
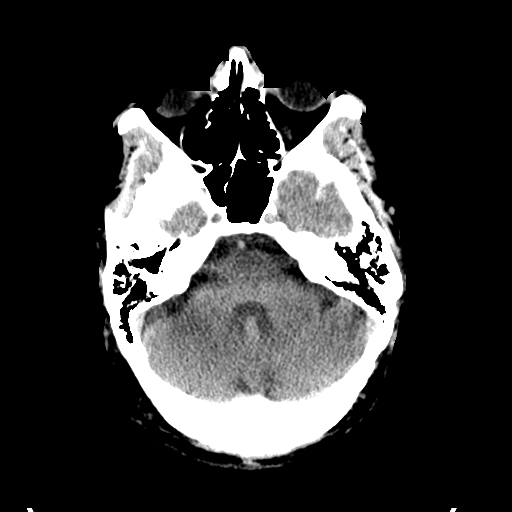
[im 14/36  brain]
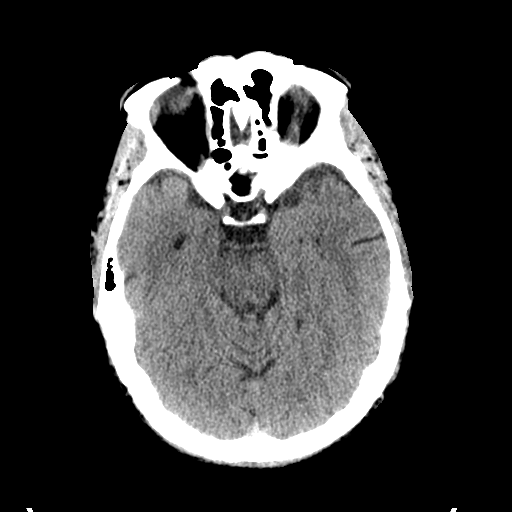
[im 19/36  brain]
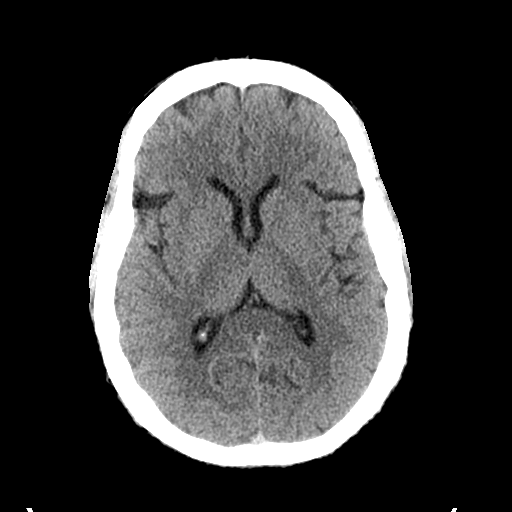
[im 19/36  bone]
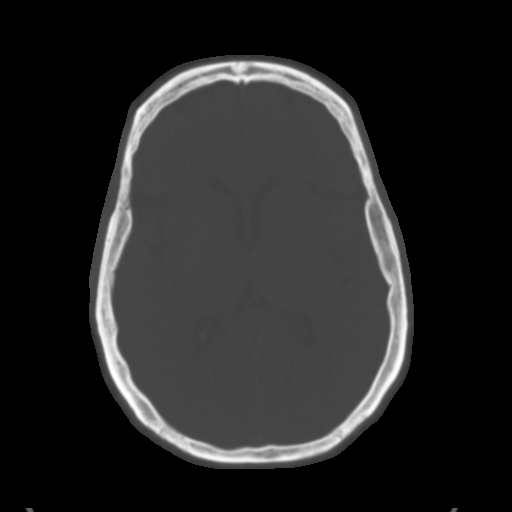
[im 22/36  brain]
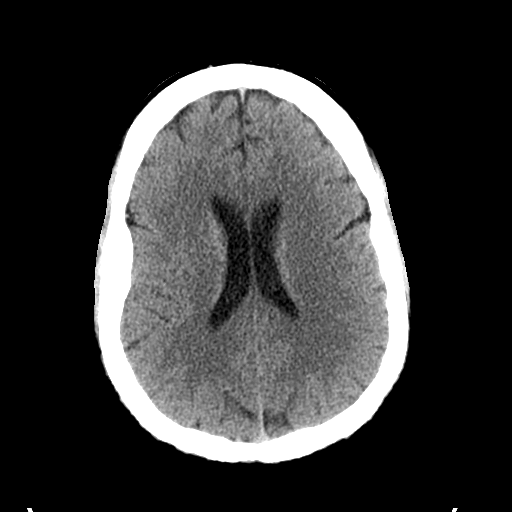
[im 26/36  brain]
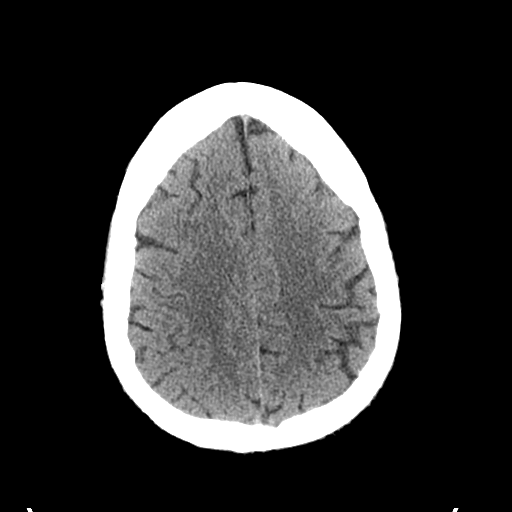
[im 29/36  brain]
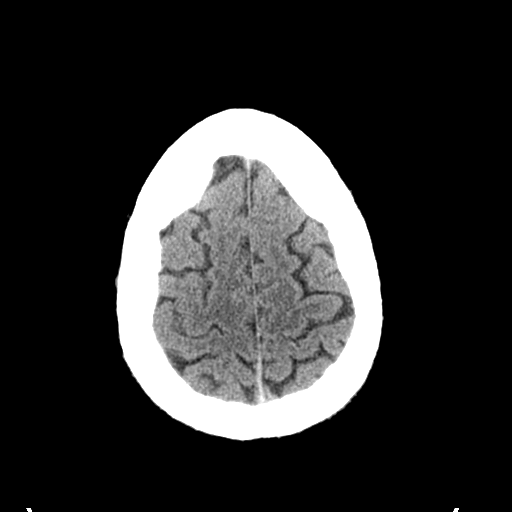
[im 33/36  brain]
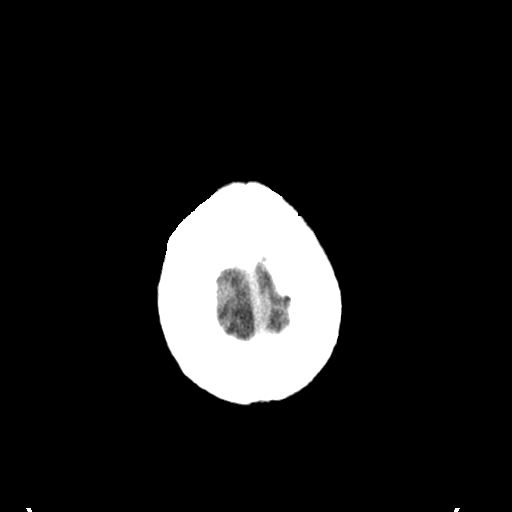
[im 33/36  bone]
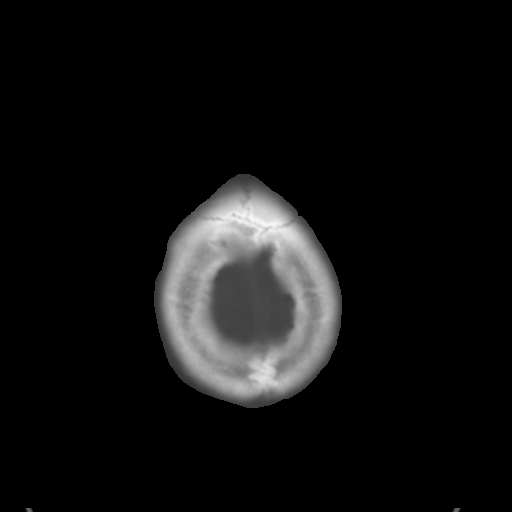

[Series 4: coronal soft tissue · coronal · 0.35mm/px · 3 of 67 slices shown]
[im 23/67  brain]
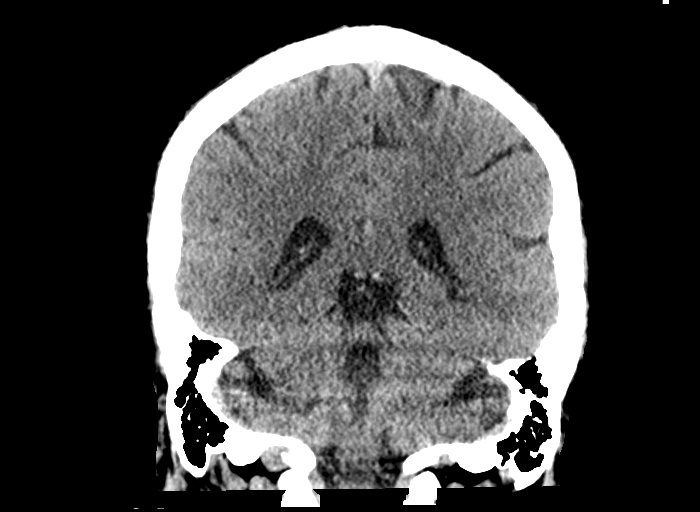
[im 30/67  brain]
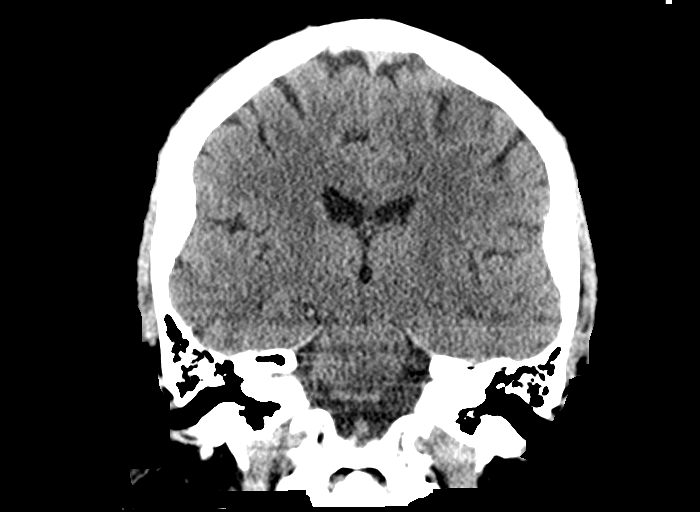
[im 37/67  brain]
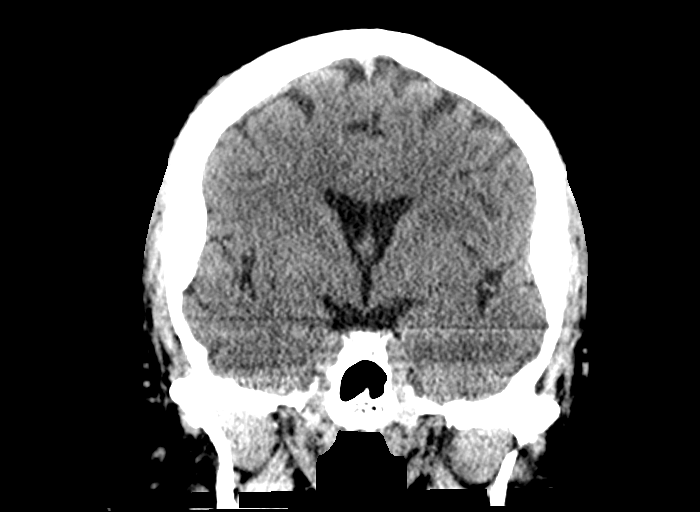

[Series 5: sagittal soft tissue · sagittal · 0.38mm/px · 3 of 57 slices shown]
[im 19/57  brain]
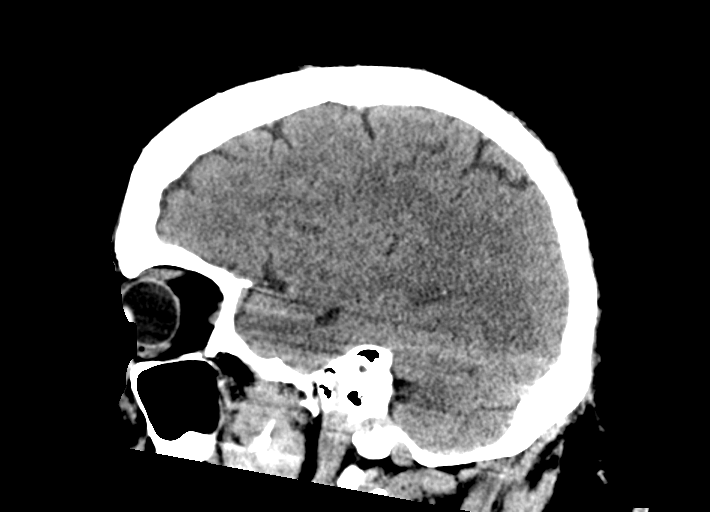
[im 29/57  brain]
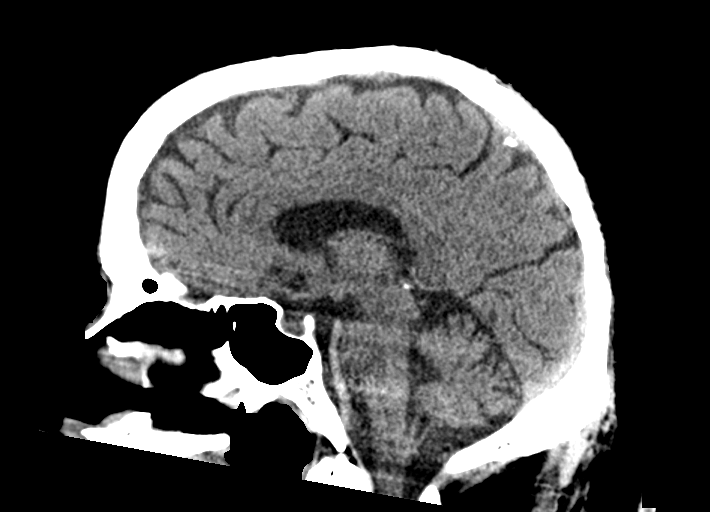
[im 38/57  brain]
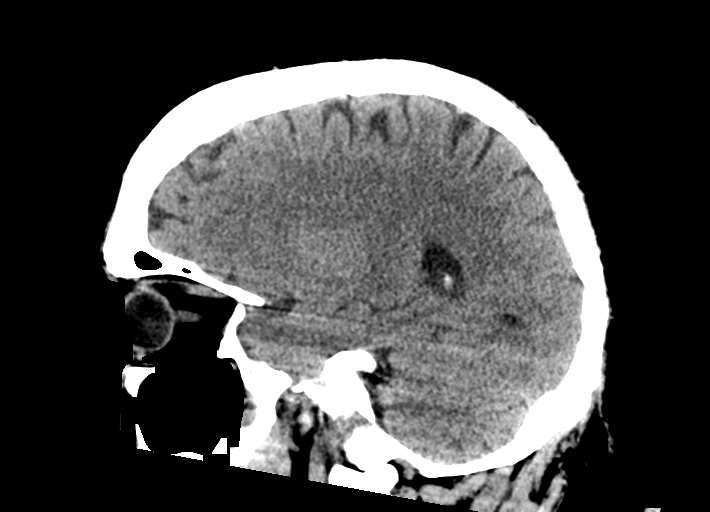

[15 of 47 positions shown; findings below may reference images not displayed]

FINDINGS: Brain: No evidence of acute infarction, hemorrhage, hydrocephalus,
extra-axial collection or mass lesion/mass effect.

Vascular: No hyperdense vessel or unexpected calcification.

Skull: Normal. Negative for fracture or focal lesion.

Sinuses/Orbits: Globes and orbits are unremarkable. Sinuses and
mastoid air cells are clear.

Other: None.
IMPRESSION: Normal enhanced CT scan of the brain.

## 2020-05-06 NOTE — Progress Notes (Signed)
Subjective: Terry Bonilla is a 49 y.o.  male returns to office today for follow up evaluation after having right Hallux medial partial nail avulsion performed. Patient has been soaking using epsom salts and applying topical antibiotic covered with bandaid daily.  Is the toe is been doing well and is healed.  His main concern is the "bump" on the toenail. Patient denies fevers, chills, nausea, vomiting. Denies any calf pain, chest pain, SOB.   Objective:  Vitals: Reviewed  General: Well developed, nourished, in no acute distress, alert and oriented x3   Dermatology: Skin is warm, dry and supple bilateral. Right hallux nail border appears to be clean, dry, with mild granular tissue and surrounding scab. There is no surrounding erythema, edema, drainage/purulence.  There is dystrophy to the toenail transverse ridging present.  There are no other lesions or other signs of infection present.  Neurovascular status: Intact. No lower extremity swelling; No pain with calf compression bilateral.  Musculoskeletal: No tenderness to palpation of the right hallux nail folds. Muscular strength within normal limits bilateral.   Assesement and Plan: S/p partial nail avulsion, doing well.   -Continue soaking in epsom salts twice a day followed by antibiotic ointment and a band-aid. Can leave uncovered at night. Continue this until completely healed.  -Discussed urea gel for the toenail. -If the area has not healed in 2 weeks, call the office for follow-up appointment, or sooner if any problems arise.  -Monitor for any signs/symptoms of infection. Call the office immediately if any occur or go directly to the emergency room. Call with any questions/concerns.  Celesta Gentile, DPM

## 2020-05-07 ENCOUNTER — Telehealth: Payer: Self-pay | Admitting: *Deleted

## 2020-05-07 NOTE — Telephone Encounter (Signed)
Pt called states the pharmacy does not have the Urea cream the strength Dr. Jacqualyn Posey recommended.

## 2020-05-07 NOTE — Telephone Encounter (Signed)
Left message informing pt [email or web address] was in office for $22.00 wo tax.

## 2020-05-09 ENCOUNTER — Telehealth: Payer: Self-pay | Admitting: Family Medicine

## 2020-05-09 DIAGNOSIS — Z130 Encounter for screening for diseases of the blood and blood-forming organs and certain disorders involving the immune mechanism: Secondary | ICD-10-CM

## 2020-05-09 DIAGNOSIS — E538 Deficiency of other specified B group vitamins: Secondary | ICD-10-CM

## 2020-05-09 NOTE — Telephone Encounter (Signed)
Patient is calling and wanted to see if Dr. Ethelene Hal could put in orders to check patient's K06, iron and folic acid . Patient stated that he is taking B12 injections but still experiencing tingling in his feet. Pls advise. CB is [phone]

## 2020-05-10 NOTE — Telephone Encounter (Signed)
Called to inform patient.

## 2020-05-10 NOTE — Telephone Encounter (Signed)
Okay. Labs ordered. He can come in when he is ready.

## 2020-05-10 NOTE — Telephone Encounter (Signed)
Dr. Kremer please advise message below.  

## 2020-05-13 ENCOUNTER — Other Ambulatory Visit: Payer: Self-pay

## 2020-05-13 ENCOUNTER — Telehealth: Payer: Self-pay | Admitting: *Deleted

## 2020-05-13 DIAGNOSIS — H401131 Primary open-angle glaucoma, bilateral, mild stage: Secondary | ICD-10-CM | POA: Diagnosis not present

## 2020-05-13 NOTE — Telephone Encounter (Signed)
Message left informing pt I was not understanding his question about the Urea cream.

## 2020-05-13 NOTE — Telephone Encounter (Signed)
Pt called for more information concerning the Urea.

## 2020-05-14 ENCOUNTER — Other Ambulatory Visit (INDEPENDENT_AMBULATORY_CARE_PROVIDER_SITE_OTHER): Payer: BC Managed Care – PPO

## 2020-05-14 DIAGNOSIS — E538 Deficiency of other specified B group vitamins: Secondary | ICD-10-CM | POA: Diagnosis not present

## 2020-05-14 DIAGNOSIS — Z130 Encounter for screening for diseases of the blood and blood-forming organs and certain disorders involving the immune mechanism: Secondary | ICD-10-CM | POA: Diagnosis not present

## 2020-05-14 LAB — VITAMIN B12: Vitamin B-12: 389 pg/mL (ref 211–911)

## 2020-05-14 LAB — FERRITIN: Ferritin: 186.7 ng/mL (ref 22.0–322.0)

## 2020-05-14 NOTE — Addendum Note (Signed)
Addended by: Lynnea Ferrier on: 05/14/2020 07:36 AM   Modules accepted: Orders

## 2020-05-16 DIAGNOSIS — G4733 Obstructive sleep apnea (adult) (pediatric): Secondary | ICD-10-CM | POA: Diagnosis not present

## 2020-06-04 ENCOUNTER — Other Ambulatory Visit: Payer: Self-pay

## 2020-06-04 ENCOUNTER — Ambulatory Visit (INDEPENDENT_AMBULATORY_CARE_PROVIDER_SITE_OTHER): Payer: BC Managed Care – PPO

## 2020-06-04 DIAGNOSIS — E538 Deficiency of other specified B group vitamins: Secondary | ICD-10-CM | POA: Diagnosis not present

## 2020-06-04 MED ORDER — CYANOCOBALAMIN 1000 MCG/ML IJ SOLN
1000.0000 ug | Freq: Once | INTRAMUSCULAR | Status: AC
  Administered 2020-06-04: 1000 ug via INTRAMUSCULAR

## 2020-06-04 NOTE — Progress Notes (Signed)
Per orders of Dr. Gherghe injection of B12 given today by Nerea Bordenave,Certified Medical Assistant. Patient tolerated injection well.  

## 2020-06-06 ENCOUNTER — Encounter: Payer: Self-pay | Admitting: Podiatry

## 2020-06-06 ENCOUNTER — Ambulatory Visit (INDEPENDENT_AMBULATORY_CARE_PROVIDER_SITE_OTHER): Payer: BC Managed Care – PPO | Admitting: Podiatry

## 2020-06-06 ENCOUNTER — Other Ambulatory Visit: Payer: Self-pay

## 2020-06-06 DIAGNOSIS — L600 Ingrowing nail: Secondary | ICD-10-CM

## 2020-06-06 DIAGNOSIS — M79675 Pain in left toe(s): Secondary | ICD-10-CM | POA: Diagnosis not present

## 2020-06-06 MED ORDER — CEPHALEXIN 500 MG PO CAPS: 500.0000 mg | ORAL_CAPSULE | Freq: Three times a day (TID) | ORAL | 0 refills | Status: DC

## 2020-06-06 NOTE — Patient Instructions (Signed)

## 2020-06-12 NOTE — Progress Notes (Signed)
Subjective: 49 year old male presents the office today for concerns of ingrown toenails of his left big toe mostly on the medial aspect.  Denies any drainage or pus to the area started become tender.  No recent treatment for this type.  The right side is been doing well. Denies any systemic complaints such as fevers, chills, nausea, vomiting. No acute changes since last appointment, and no other complaints at this time.   Objective: AAO x3, NAD DP/PT pulses palpable bilaterally, CRT less than 3 seconds Incurvation present to both medial lateral aspect left hallux toenail as well as edema but there is no erythema, ascending cellulitis.  There is no drainage or pus.  Right side is doing well. No open lesions or pre-ulcerative lesions.  No pain with calf compression, swelling, warmth, erythema  Assessment: Left hallux ingrown toenail  Plan: -All treatment options discussed with the patient including all alternatives, risks, complications.  -At this time, the patient is requesting partial nail removal with chemical matricectomy to the symptomatic portion of the nail. Risks and complications were discussed with the patient for which they understand and written consent was obtained. Under sterile conditions a total of 3 mL of a mixture of 2% lidocaine plain and 0.5% Marcaine plain was infiltrated in a hallux block fashion. Once anesthetized, the skin was prepped in sterile fashion. A tourniquet was then applied. Next the medial and lateral aspect of hallux nail border was then sharply excised making sure to remove the entire offending nail border. Once the nails were ensured to be removed area was debrided and the underlying skin was intact. There is no purulence identified in the procedure. Next phenol was then applied under standard conditions and copiously irrigated. Silvadene was applied. A dry sterile dressing was applied. After application of the dressing the tourniquet was removed and there is found  to be an immediate capillary refill time to the digit. The patient tolerated the procedure well any complications. Post procedure instructions were discussed the patient for which he verbally understood. Follow-up in one week for nail check or sooner if any problems are to arise. Discussed signs/symptoms of infection and directed to call the office immediately should any occur or go directly to the emergency room. In the meantime, encouraged to call the office with any questions, concerns, changes symptoms. -Keflex -Patient encouraged to call the office with any questions, concerns, change in symptoms.   Trula Slade DPM

## 2020-06-17 ENCOUNTER — Ambulatory Visit (INDEPENDENT_AMBULATORY_CARE_PROVIDER_SITE_OTHER): Payer: BC Managed Care – PPO | Admitting: Neurology

## 2020-06-17 ENCOUNTER — Other Ambulatory Visit: Payer: Self-pay

## 2020-06-17 ENCOUNTER — Other Ambulatory Visit: Payer: BC Managed Care – PPO

## 2020-06-17 ENCOUNTER — Encounter: Payer: Self-pay | Admitting: Neurology

## 2020-06-17 VITALS — BP 150/95 | HR 80 | Ht 75.0 in | Wt 273.0 lb

## 2020-06-17 DIAGNOSIS — E539 Vitamin B deficiency, unspecified: Secondary | ICD-10-CM | POA: Diagnosis not present

## 2020-06-17 DIAGNOSIS — G4733 Obstructive sleep apnea (adult) (pediatric): Secondary | ICD-10-CM | POA: Diagnosis not present

## 2020-06-17 DIAGNOSIS — G629 Polyneuropathy, unspecified: Secondary | ICD-10-CM | POA: Diagnosis not present

## 2020-06-17 NOTE — Progress Notes (Signed)
[facility] Clinic Note - Initial Visit   Date: 06/17/20  Terry Bonilla MRN: [id number] DOB: 1971-10-16   Dear Dr. Ethelene Hal:   Thank you for your kind referral of Terry Bonilla for consultation of bilateral feet tingling. Although his history is well known to you, please allow [name] to reiterate it for the purpose of our medical record. The patient was accompanied to the clinic by self.   History of Present Illness: Terry Bonilla is a 49 y.o. right-handed male with diabetes mellitus, and OSA presenting for evaluation of bilateral feet tingling. Starting in February 2021, he began having numbness and tingling over the soles of the feet. Since onset, symptoms have been constant.  Initially, he has cold sensation and episodic stinging in the legs, which has improved over time. Now, there is no longer numbness/tingling in the heel, but continues to remain in the balls of the feet and toes.  He was found to have vitmain B12 deficiency in March and supplemented with injections. Recent B12 from June 2021 improved to 389.   He was started on metformin in November 2020 for borderline diabetes.    He drinks 2 drinks nightly (beer/wine) for the past 30 years.  He is not on any special diet. Nonsmoker.  No family history of neuropathy.   Over the past two weeks, he also reports having low back pain and stiffness upon awakening. This improves have he has urinated.  He also notices darker discoloration of urine and his feet seem discolored.    Out-side paper records, electronic medical record, and images have been reviewed where available and summarized as:  Lab Results  Component Value Date   HGBA1C 6.6 (A) 04/01/2020   Lab Results  Component Value Date   [id number] 009 05/14/2020   Lab Results  Component Value Date   TSH 1.420 11/27/2019   Lab Results  Component Value Date   FOLATE 18.3 01/30/2020     Past Medical History:  Diagnosis Date  . Diabetes mellitus  without complication ([place]) 38/1829   pre-diabetes A1C 6.4  . Sleep apnea 2009   cpap  . Substance abuse ([place])    ETOH    Past Surgical History:  Procedure Laterality Date  . TONSILLECTOMY     age 49     Medications:  Outpatient Encounter Medications as of 06/17/2020  Medication Sig  . ammonium lactate (AMLACTIN) 12 % cream Apply topically as needed for dry skin.  Marland Kitchen atorvastatin (LIPITOR) 10 MG tablet Take 1 tablet (10 mg total) by mouth daily. (Patient taking differently: Take 10 mg by mouth daily. Takes at night)  . betamethasone dipropionate 0.05 % cream Apply 1 application topically as needed.  . finasteride (PROPECIA) 1 MG tablet Take 1 tablet by mouth daily.  Marland Kitchen glucose blood (TRUE METRIX BLOOD GLUCOSE TEST) test strip Use to test blood sugars 1-2 times daily.  . metFORMIN (GLUCOPHAGE-XR) 750 MG 24 hr tablet Take 1500 mg by mouth with dinner (Patient taking differently: Take 750 mg by mouth. Take 1 tablet with breakfast and 1 tablet with dinner.)  . Travoprost, BAK Free, (TRAVATAN) 0.004 % SOLN ophthalmic solution 1 drop at bedtime.  . triamcinolone (KENALOG) 0.025 % ointment Apply 1 application topically 2 (two) times daily. Not for face or private areas.  . TRUEplus Lancets 30G MISC USE TO TEST BLOOD SUGARS 1-2 TIMES DAILY.  Marland Kitchen UNABLE TO FIND Gets Vitamin B-12 i4/njections  Last injection was on 02/02/20; next one is 03/05/20  Doing for the next 6 months  . Urea 40 % GEL Apply 1 application topically daily.  . [DISCONTINUED] cephALEXin (KEFLEX) 500 MG capsule Take 1 capsule (500 mg total) by mouth 3 (three) times daily. (Patient not taking: Reported on 06/17/2020)  . [DISCONTINUED] cephALEXin (KEFLEX) 500 MG capsule Take 1 capsule (500 mg total) by mouth 3 (three) times daily. (Patient not taking: Reported on 06/17/2020)  . [DISCONTINUED] meclizine (ANTIVERT) 25 MG tablet Take 1 tablet (25 mg total) by mouth 3 (three) times daily as needed for dizziness. (Patient not taking:  Reported on 06/17/2020)   No facility-administered encounter medications on file as of 06/17/2020.    Allergies:  Allergies  Allergen Reactions  . Crab [Shellfish Allergy] Hives  . Sulfa Antibiotics Hives  . Sulfur Rash    Family History: Family History  Problem Relation Age of Onset  . Ovarian cancer Mother   . Arrhythmia Father   . Diabetes type II Brother   . Colon cancer Neg Hx   . Colon polyps Neg Hx   . Esophageal cancer Neg Hx   . Rectal cancer Neg Hx   . Stomach cancer Neg Hx     Social History: Social History   Tobacco Use  . Smoking status: Never Smoker  . Smokeless tobacco: Never Used  Vaping Use  . Vaping Use: Never used  Substance Use Topics  . Alcohol use: Yes    Alcohol/week: 2.0 standard drinks    Types: 1 Glasses of wine, 1 Cans of beer per week    Comment: two drinks nightly occassionally   . Drug use: Never   Social History   Social History Narrative  . Not on file    Vital Signs:  BP (!) 150/95   Pulse 80   Ht 6\' 3"  (1.905 m)   Wt (!) 273 lb (123.8 kg)   SpO2 99%   BMI 34.12 kg/m    General Medical Exam:   General:  Well appearing, comfortable.     Extremities:  No deformities, edema, or skin discoloration. Pedal pulses are easily palpable.  Skin:  No rashes or lesions.  Neurological Exam: MENTAL STATUS including orientation to time, place, person, recent and remote memory, attention span and concentration, language, and fund of knowledge is normal.  Speech is not dysarthric.  CRANIAL NERVES: II:  No visual field defects.   III-IV-VI: Pupils equal round and reactive to light.  Normal conjugate, extra-ocular eye movements in all directions of gaze.  No nystagmus.  No ptosis.   V:  Normal facial sensation.    VII:  Normal facial symmetry and movements.   VIII:  Normal hearing and vestibular function.   IX-X:  Normal palatal movement.   XI:  Normal shoulder shrug and head rotation.   XII:  Normal tongue strength and range of  motion, no deviation or fasciculation.  MOTOR:  No atrophy, fasciculations or abnormal movements.  No pronator drift.   Upper Extremity:  Right  Left  Deltoid  5/5   5/5   Biceps  5/5   5/5   Triceps  5/5   5/5   Infraspinatus 5/5  5/5  Medial pectoralis 5/5  5/5  Wrist extensors  5/5   5/5   Wrist flexors  5/5   5/5   Finger extensors  5/5   5/5   Finger flexors  5/5   5/5   Dorsal interossei  5/5   5/5   Abductor pollicis  5/5   5/5  Tone (Ashworth scale)  0  0   Lower Extremity:  Right  Left  Hip flexors  5/5   5/5   Hip extensors  5/5   5/5   Adductor 5/5  5/5  Abductor 5/5  5/5  Knee flexors  5/5   5/5   Knee extensors  5/5   5/5   Dorsiflexors  5/5   5/5   Plantarflexors  5/5   5/5   Toe extensors  5/5   5/5   Toe flexors  5/5   5/5   Tone (Ashworth scale)  0  0   MSRs:  Right        Left                  brachioradialis 1+  1+  biceps 1+  1+  triceps 1+  1+  patellar 1+  1+  ankle jerk 1+  1+  Hoffman no  no  plantar response down  down   SENSORY:  Normal and symmetric perception of light touch, pinprick, vibration, and proprioception.  Romberg's sign absent.   COORDINATION/GAIT: Normal finger-to- nose-finger and heel-to-shin.  Intact rapid alternating movements bilaterally.  Able to rise from a chair without using arms.  Gait narrow based and stable. Tandem and stressed gait intact.    IMPRESSION: 1.  Bilateral feet paresthesias due to vitamin B12 deficiency.  Other contributing factors:  prediabetes, alcohol, metformin.  Symptoms have improved, but continue to remain distal to the midfoot bilaterally.  I do not appreciate any skin changes and reassured patient   - Check EMG/NCS for underlying neuropathy  - Check vitamin B1 level to screen for thiamine deficiency  - Start daily multivitamin  - Continue B12 supplementations, start oral 1033mcg daily after completing monthly injections  - Try to minimize alcohol   2.  Lumbar strain  - No evidence of  nerve-related pathology  - Recommend stretching  Further recommendations pending results.   Total time spent reviewing records, history/exam, interview, and documentation on day of encounter:  60 min  Thank you for allowing me to participate in patient's care.  If I can answer any additional questions, I would be pleased to do so.    Sincerely,    Wil Slape K. Posey Pronto, DO

## 2020-06-17 NOTE — Patient Instructions (Addendum)
Check vitamin B1 level We will order nerve testing of the legs.   Start daily multivitamin Continue vitamin B12 injections, once completed, start vitamin B12 1047mcg daily   ELECTROMYOGRAM AND NERVE CONDUCTION STUDIES (EMG/NCS) INSTRUCTIONS  How to Prepare The neurologist conducting the EMG will need to know if you have certain medical conditions. Tell the neurologist and other EMG lab personnel if you: . Have a pacemaker or any other electrical medical device . Take blood-thinning medications . Have hemophilia, a blood-clotting disorder that causes prolonged bleeding Bathing Take a shower or bath shortly before your exam in order to remove oils from your skin. Don't apply lotions or creams before the exam.  What to Expect You'll likely be asked to change into a hospital gown for the procedure and lie down on an examination table. The following explanations can help you understand what will happen during the exam.  . Electrodes. The neurologist or a technician places surface electrodes at various locations on your skin depending on where you're experiencing symptoms. Or the neurologist may insert needle electrodes at different sites depending on your symptoms.  . Sensations. The electrodes will at times transmit a tiny electrical current that you may feel as a twinge or spasm. The needle electrode may cause discomfort or pain that usually ends shortly after the needle is removed. If you are concerned about discomfort or pain, you may want to talk to the neurologist about taking a short break during the exam.  . Instructions. During the needle EMG, the neurologist will assess whether there is any spontaneous electrical activity when the muscle is at rest - activity that isn't present in healthy muscle tissue - and the degree of activity when you slightly contract the muscle.  He or she will give you instructions on resting and contracting a muscle at appropriate times. Depending on what muscles  and nerves the neurologist is examining, he or she may ask you to change positions during the exam.  After your EMG You may experience some temporary, minor bruising where the needle electrode was inserted into your muscle. This bruising should fade within several days. If it persists, contact your primary care doctor.

## 2020-06-19 ENCOUNTER — Other Ambulatory Visit: Payer: Self-pay | Admitting: Family Medicine

## 2020-06-19 DIAGNOSIS — E1140 Type 2 diabetes mellitus with diabetic neuropathy, unspecified: Secondary | ICD-10-CM

## 2020-06-20 ENCOUNTER — Ambulatory Visit: Payer: BC Managed Care – PPO | Admitting: Podiatry

## 2020-06-20 NOTE — Telephone Encounter (Signed)
Seeking alternative to previous medication due to insurance coverage

## 2020-06-21 ENCOUNTER — Encounter: Payer: Self-pay | Admitting: Family Medicine

## 2020-06-21 MED ORDER — BLOOD GLUCOSE MONITOR KIT: PACK | 0 refills | Status: DC

## 2020-06-22 LAB — VITAMIN B1: Vitamin B1 (Thiamine): 10 nmol/L (ref 8–30)

## 2020-06-24 ENCOUNTER — Other Ambulatory Visit: Payer: Self-pay

## 2020-06-25 ENCOUNTER — Ambulatory Visit (INDEPENDENT_AMBULATORY_CARE_PROVIDER_SITE_OTHER): Payer: BC Managed Care – PPO | Admitting: Nurse Practitioner

## 2020-06-25 ENCOUNTER — Encounter: Payer: Self-pay | Admitting: Nurse Practitioner

## 2020-06-25 VITALS — BP 128/84 | HR 88 | Temp 97.0°F | Ht 75.0 in | Wt 271.0 lb

## 2020-06-25 DIAGNOSIS — R109 Unspecified abdominal pain: Secondary | ICD-10-CM

## 2020-06-25 LAB — POCT URINALYSIS DIPSTICK
Bilirubin, UA: NEGATIVE
Blood, UA: NEGATIVE
Glucose, UA: NEGATIVE
Ketones, UA: NEGATIVE
Leukocytes, UA: NEGATIVE
Nitrite, UA: NEGATIVE
Protein, UA: POSITIVE — AB
Spec Grav, UA: >=1.03 — AB (ref 1.010–1.025)
Urobilinogen, UA: 0.2 E.U./dL
pH, UA: 5.5 (ref 5.0–8.0)

## 2020-06-25 LAB — CBC WITH DIFFERENTIAL/PLATELET
Basophils Absolute: 0.1 10*3/uL (ref 0.0–0.1)
Basophils Relative: 1.2 % (ref 0.0–3.0)
Eosinophils Absolute: 0.2 10*3/uL (ref 0.0–0.7)
Eosinophils Relative: 3 % (ref 0.0–5.0)
HCT: 46.7 % (ref 39.0–52.0)
Hemoglobin: 16.3 g/dL (ref 13.0–17.0)
Lymphocytes Relative: 29 % (ref 12.0–46.0)
Lymphs Abs: 1.5 10*3/uL (ref 0.7–4.0)
MCHC: 34.9 g/dL (ref 30.0–36.0)
MCV: 88.1 fl (ref 78.0–100.0)
Monocytes Absolute: 0.3 10*3/uL (ref 0.1–1.0)
Monocytes Relative: 6.3 % (ref 3.0–12.0)
Neutro Abs: 3.2 10*3/uL (ref 1.4–7.7)
Neutrophils Relative %: 60.5 % (ref 43.0–77.0)
Platelets: 175 10*3/uL (ref 150.0–400.0)
RBC: 5.3 Mil/uL (ref 4.22–5.81)
RDW: 13 % (ref 11.5–15.5)
WBC: 5.3 10*3/uL (ref 4.0–10.5)

## 2020-06-25 LAB — BASIC METABOLIC PANEL
BUN: 14 mg/dL (ref 6–23)
CO2: 25 mEq/L (ref 19–32)
Calcium: 9.4 mg/dL (ref 8.4–10.5)
Chloride: 103 mEq/L (ref 96–112)
Creatinine, Ser: 0.78 mg/dL (ref 0.40–1.50)
GFR: 104.83 mL/min (ref >60.00)
Glucose, Bld: 145 mg/dL — ABNORMAL HIGH (ref 70–99)
Potassium: 4.3 mEq/L (ref 3.5–5.1)
Sodium: 135 mEq/L (ref 135–145)

## 2020-06-25 LAB — C-REACTIVE PROTEIN: CRP: <1 mg/dL (ref 0.5–20.0)

## 2020-06-25 MED ORDER — CIPROFLOXACIN HCL 250 MG PO TABS: 250.0000 mg | ORAL_TABLET | Freq: Two times a day (BID) | ORAL | 0 refills | Status: DC

## 2020-06-25 NOTE — Patient Instructions (Addendum)
Maintain adequate oral hydration Start oral abx, take with food. Use tylenol for pain Go to lab for blood draw.  Flank Pain, Adult Flank pain is pain in your side. The flank is the area of your side between your upper belly (abdomen) and your back. The pain may occur over a short time (acute), or it may be long-term or come back often (chronic). It may be mild or very bad. Pain in this area can be caused by many different things. Follow these instructions at home:   Drink enough fluid to keep your pee (urine) clear or pale yellow.  Rest as told by your doctor.  Take over-the-counter and prescription medicines only as told by your doctor.  Keep a journal to keep track of: ? What has caused your flank pain. ? What has made it feel better.  Keep all follow-up visits as told by your doctor. This is important. Contact a doctor if:  Medicine does not help your pain.  You have new symptoms.  Your pain gets worse.  You have a fever.  Your symptoms last longer than 2-3 days.  You have trouble peeing.  You are peeing more often than normal. Get help right away if:  You have trouble breathing.  You are short of breath.  Your belly hurts, or it is swollen or red.  You feel sick to your stomach (nauseous).  You throw up (vomit).  You feel like you will pass out, or you do pass out (faint).  You have blood in your pee. Summary  Flank pain is pain in your side. The flank is the area of your side between your upper belly (abdomen) and your back.  Flank pain may occur over a short time (acute), or it may be long-term or come back often (chronic). It may be mild or very bad.  Pain in this area can be caused by many different things.  Contact your doctor if your symptoms get worse or they last longer than 2-3 days. This information is not intended to replace advice given to you by your health care provider. Make sure you discuss any questions you have with your health care  provider. Document Revised: 10/22/2017 Document Reviewed: 03/01/2017 Elsevier Patient Education  [employer].

## 2020-06-25 NOTE — Progress Notes (Signed)
Subjective:  Patient ID: Terry Bonilla, male    DOB: May 01, 1971  Age: 50 y.o. MRN: [id number]  CC: Acute Visit (dark amber urine-esp morning its dark-lower back pain and right side pain//pt also complains of being lightheaded)  Back Pain This is a new problem. The current episode started in the past 7 days. The problem occurs constantly. The problem is unchanged. The pain is present in the lumbar spine. The quality of the pain is described as aching. The pain does not radiate. The pain is mild. Stiffness is present all day. Pertinent negatives include no bladder incontinence, bowel incontinence, dysuria, fever, leg pain, pelvic pain, tingling, weakness or weight loss. Risk factors include obesity, poor posture, sedentary lifestyle and lack of exercise. He has tried nothing for the symptoms.  not sexually active, denies need for STD screen. Denies any constipation or diarrhea or nausea or groin pain  Reviewed past Medical, Social and Family history today.  Outpatient Medications Prior to Visit  Medication Sig Dispense Refill  . ammonium lactate (AMLACTIN) 12 % cream Apply topically as needed for dry skin. 385 g 0  . atorvastatin (LIPITOR) 10 MG tablet Take 1 tablet (10 mg total) by mouth daily. (Patient taking differently: Take 10 mg by mouth daily. Takes at night) 90 tablet 3  . betamethasone dipropionate 0.05 % cream Apply 1 application topically as needed.    . blood glucose meter kit and supplies KIT Dispense based on patient and insurance preference. Use up to four times daily as directed. (FOR ICD-9 250.00, 250.01). 1 each 0  . finasteride (PROPECIA) 1 MG tablet Take 1 tablet by mouth daily.    Marland Kitchen glucose blood (FREESTYLE TEST STRIPS) test strip Check blood sugar BID and PRN 100 each 3  . metFORMIN (GLUCOPHAGE-XR) 750 MG 24 hr tablet Take 1500 mg by mouth with dinner (Patient taking differently: Take 750 mg by mouth. Take 1 tablet with breakfast and 1 tablet with dinner.) 180 tablet 3  .  Travoprost, BAK Free, (TRAVATAN) 0.004 % SOLN ophthalmic solution 1 drop at bedtime.    . triamcinolone (KENALOG) 0.025 % ointment Apply 1 application topically 2 (two) times daily. Not for face or private areas. 80 g 0  . TRUEplus Lancets 30G MISC USE TO TEST BLOOD SUGARS 1-2 TIMES DAILY. 100 each 3  . UNABLE TO FIND Gets Vitamin B-12 i4/njections  Last injection was on 02/02/20; next one is 03/05/20  Doing for the next 6 months    . Urea 40 % GEL Apply 1 application topically daily. 15 mL 2   No facility-administered medications prior to visit.    ROS See HPI  Objective:  BP 128/84   Pulse 88   Temp (!) 97 F (36.1 C) (Tympanic)   Ht '6\' 3"'  (1.905 m)   Wt 271 lb (122.9 kg)   SpO2 99%   BMI 33.87 kg/m   Physical Exam Vitals reviewed.  Constitutional:      Appearance: He is obese.  Cardiovascular:     Pulses: Normal pulses.  Pulmonary:     Effort: Pulmonary effort is normal.  Abdominal:     General: There is no distension.     Palpations: Abdomen is soft.     Tenderness: There is no abdominal tenderness. There is no right CVA tenderness, left CVA tenderness or guarding.  Skin:    General: Skin is warm and dry.     Findings: No erythema or rash.  Neurological:     Mental Status:  He is alert and oriented to person, place, and time.    Assessment & Plan:  This visit occurred during the SARS-CoV-2 public health emergency.  Safety protocols were in place, including screening questions prior to the visit, additional usage of staff PPE, and extensive cleaning of exam room while observing appropriate contact time as indicated for disinfecting solutions.   [name] was seen today for acute visit.  Diagnoses and all orders for this visit:  Acute right flank pain -     POCT urinalysis dipstick -     Urine Culture -     CBC w/Diff -     C-reactive protein -     ciprofloxacin (CIPRO) 250 MG tablet; Take 1 tablet (250 mg total) by mouth 2 (two) times daily. -     Basic  metabolic panel  Maintain adequate oral hydration Start oral abx, take with food. Use tylenol for pain Normal lab results, no additional oral abx needed  Problem List Items Addressed This Visit    None    Visit Diagnoses    Acute right flank pain    -  Primary   Relevant Medications   ciprofloxacin (CIPRO) 250 MG tablet   Other Relevant Orders   POCT urinalysis dipstick (Completed)   Urine Culture (Completed)   CBC w/Diff (Completed)   C-reactive protein (Completed)   Basic metabolic panel (Completed)      Follow-up: No follow-ups on file.  Wilfred Lacy, NP

## 2020-06-26 ENCOUNTER — Other Ambulatory Visit: Payer: Self-pay

## 2020-06-26 ENCOUNTER — Ambulatory Visit (INDEPENDENT_AMBULATORY_CARE_PROVIDER_SITE_OTHER): Payer: BC Managed Care – PPO | Admitting: Neurology

## 2020-06-26 DIAGNOSIS — G629 Polyneuropathy, unspecified: Secondary | ICD-10-CM

## 2020-06-26 NOTE — Procedures (Signed)
[facility]  [address], [address]  [place], [place] [address] Tel: [phone] Fax:  [phone] Test Date:  06/26/2020  Patient: Terry Bonilla DOB: Mar 18, 1971 Physician: Narda Amber, DO  Sex: Male Height: 6\' 3"  Ref Phys: Narda Amber, DO  ID#: [id number] Temp: 35.0C Technician:    Patient Complaints: This is a 49 year old man with history of diabetes and vitamin B12 deficiency referred for evaluation of bilateral feet numbness and tingling.  NCV & EMG Findings: Electrodiagnostic testing of the right lower extremity and additional studies of the left shows: 1. Bilateral superficial peroneal sensory responses are borderline low, but remain within the normal ranges.  Bilateral sural sensory responses are within normal limits.   2. Bilateral peroneal and tibial motor responses are within normal limits. 3. Bilateral tibial H reflex studies are within normal limits. 4. There is no evidence of active or chronic motor axonal changes affecting any of the tested muscles.  Motor unit configuration and recruitment pattern is within normal limits.  Impression: This is a normal study of the lower extremities.  In particular, there is no evidence of a sensorimotor polyneuropathy or lumbosacral radiculopathy.     ___________________________ Narda Amber, DO    Nerve Conduction Studies Anti Sensory Summary Table   Stim Site NR Peak (ms) Norm Peak (ms) P-T Amp (V) Norm P-T Amp  Left Sup Peroneal Anti Sensory (Ant Lat Mall)  35C  12 cm    3.6 <4.6 5.4 >4  Right Sup Peroneal Anti Sensory (Ant Lat Mall)  35C  12 cm    2.8 <4.6 4.1 >4  Left Sural Anti Sensory (Lat Mall)  35C  Calf    3.5 <4.6 8.0 >4  Right Sural Anti Sensory (Lat Mall)  35C  Calf    3.1 <4.6 8.4 >4   Motor Summary Table   Stim Site NR Onset (ms) Norm Onset (ms) O-P Amp (mV) Norm O-P Amp Site1 Site2 Delta-0 (ms) Dist (cm) Vel (m/s) Norm Vel (m/s)  Left Peroneal Motor (Ext Dig Brev)  35C  Ankle    4.4  <6.0 4.7 >2.5 B Fib Ankle 8.5 39.0 46 >40  B Fib    12.9  4.1  Poplt B Fib 1.9 10.0 53 >40  Poplt    14.8  3.9         Right Peroneal Motor (Ext Dig Brev)  35C  Ankle    3.6 <6.0 4.5 >2.5 B Fib Ankle 9.5 38.0 40 >40  B Fib    13.1  4.1  Poplt B Fib 2.4 10.0 42 >40  Poplt    15.5  3.6         Left Tibial Motor (Abd Hall Brev)  35C  Ankle    4.9 <6.0 7.9 >4 Knee Ankle 9.5 45.0 47 >40  Knee    14.4  5.6         Right Tibial Motor (Abd Hall Brev)  35C  Ankle    3.2 <6.0 6.9 >4 Knee Ankle 10.8 46.0 43 >40  Knee    14.0  5.2          H Reflex Studies   NR H-Lat (ms) Lat Norm (ms) L-R H-Lat (ms)  Left Tibial (Gastroc)  35C     34.83 <35 0.00  Right Tibial (Gastroc)  35C     34.83 <35 0.00   EMG   Side Muscle Ins Act Fibs Psw Fasc Number Recrt Dur Dur. Amp Amp. Poly Poly. Comment  Right  AntTibialis Nml Nml Nml Nml Nml Nml Nml Nml Nml Nml Nml Nml N/A  Right Gastroc Nml Nml Nml Nml Nml Nml Nml Nml Nml Nml Nml Nml N/A  Right Flex Dig Long Nml Nml Nml Nml Nml Nml Nml Nml Nml Nml Nml Nml N/A  Right RectFemoris Nml Nml Nml Nml Nml Nml Nml Nml Nml Nml Nml Nml N/A  Right BicepsFemS Nml Nml Nml Nml Nml Nml Nml Nml Nml Nml Nml Nml N/A  Left BicepsFemS Nml Nml Nml Nml Nml Nml Nml Nml Nml Nml Nml Nml N/A  Left AntTibialis Nml Nml Nml Nml Nml Nml Nml Nml Nml Nml Nml Nml N/A  Left Gastroc Nml Nml Nml Nml Nml Nml Nml Nml Nml Nml Nml Nml N/A  Left Flex Dig Long Nml Nml Nml Nml Nml Nml Nml Nml Nml Nml Nml Nml N/A  Left RectFemoris Nml Nml Nml Nml Nml Nml Nml Nml Nml Nml Nml Nml N/A      Waveforms:

## 2020-06-27 ENCOUNTER — Encounter: Payer: Self-pay | Admitting: Nurse Practitioner

## 2020-06-27 LAB — URINE CULTURE
MICRO NUMBER:: 10781562
Result:: NO GROWTH
SPECIMEN QUALITY:: ADEQUATE

## 2020-06-28 ENCOUNTER — Telehealth: Payer: Self-pay | Admitting: Nurse Practitioner

## 2020-06-28 NOTE — Telephone Encounter (Signed)
Patient states he spoke with someone yesterday regarding results of his urine tests on Tuesday, including positive protein. He just started taking antibiotics Thursday night and is having some lower back pain today and urine is still darker. Does he need urology referral?

## 2020-06-29 NOTE — Telephone Encounter (Signed)
Not at this time. He was extremely dehydrated and needs to drink more fluids. Avoid caffeine and alcohol, please. Follow up with Baldo Ash as directed for a recheck.

## 2020-06-29 NOTE — Telephone Encounter (Signed)
Pt informed

## 2020-06-29 NOTE — Telephone Encounter (Signed)
Please see message and advise.  Thank you. ° °

## 2020-07-01 ENCOUNTER — Ambulatory Visit: Payer: BC Managed Care – PPO | Admitting: Neurology

## 2020-07-01 ENCOUNTER — Encounter: Payer: Self-pay | Admitting: Nurse Practitioner

## 2020-07-01 DIAGNOSIS — R350 Frequency of micturition: Secondary | ICD-10-CM

## 2020-07-03 NOTE — Telephone Encounter (Signed)
Patient is calling back regarding my chart message, please advise. CB is [phone]

## 2020-07-05 ENCOUNTER — Ambulatory Visit: Payer: BC Managed Care – PPO

## 2020-07-05 NOTE — Telephone Encounter (Signed)
Returned pt call as requested. Pt expressed concern because he has not seen any improvements to his symptoms, further adding, "this is not normal" and "something has to be done". Added this has gone on for several weeks. Reviewed following symptoms with pt:  Urine odor - yes Frequency - yes. Consuming 60-80 ounces of water per day and is not certain if this contributive to the increased frequency Urine color - dark yellow MOST noticeable in the morning. States, "color improves as the day goes on but returns to a dark color in the AM" Incomplete voiding - denies Fever - denies Fatigue - yes Pain - lower back pain but denies pain when voiding  States he complete course of ATB's did not notice any improvement when taking Cipro. Last A1C on 04/01/20 was 6.6. Next f/u appt with Dr. Cruzita Lederer is 08/05/20 and next f/u appt with Dr. Ethelene Hal is 08/02/20. Pt further states he is not certain if he needs referred to urologist or renal specialist. Advised I would forward this information to both Dr. Ethelene Hal and Wilfred Lacy, NP and would call back with further advice/orders once I receive a response. Prior to disconnecting call, pt asked me to read my message above to ensure I did not miss anything. Read my entire message and asked if he agreed with documentation or did he wish me to add more to this message. Pt declined to add additional specifics to this message.

## 2020-07-05 NOTE — Telephone Encounter (Signed)
Patient called back is asking for a referral to see a urologist. Patient stated that he is still in pain and urine is still dark and would like to speak to someone, please advise. CB is [phone]

## 2020-07-08 ENCOUNTER — Other Ambulatory Visit: Payer: BC Managed Care – PPO

## 2020-07-08 ENCOUNTER — Other Ambulatory Visit: Payer: Self-pay

## 2020-07-08 ENCOUNTER — Telehealth: Payer: Self-pay | Admitting: Family Medicine

## 2020-07-08 DIAGNOSIS — R350 Frequency of micturition: Secondary | ICD-10-CM

## 2020-07-08 NOTE — Telephone Encounter (Signed)
Patient states insurance will not cover diabetes supplies as currently prescribed. Please change prescriptions to: Freestyle LITE meter, Freestyle LITE lancing device Freestyle LITE test stips Freestyle LITE lances. Please send to [facility], [address], [place], phone [phone].

## 2020-07-09 LAB — NO CULTURE INDICATED

## 2020-07-09 LAB — URINALYSIS W MICROSCOPIC + REFLEX CULTURE
Bilirubin Urine: NEGATIVE
Glucose, UA: NEGATIVE
Hyaline Cast: NONE SEEN /LPF
Ketones, ur: NEGATIVE
Leukocyte Esterase: NEGATIVE
Nitrites, Initial: NEGATIVE
Specific Gravity, Urine: 1.019 (ref 1.001–1.03)
Squamous Epithelial / HPF: NONE SEEN /HPF (ref <=5)
WBC, UA: NONE SEEN /HPF (ref 0–5)
pH: <=5 (ref 5.0–8.0)

## 2020-07-10 ENCOUNTER — Telehealth: Payer: Self-pay | Admitting: Family Medicine

## 2020-07-10 ENCOUNTER — Telehealth: Payer: Self-pay | Admitting: Nurse Practitioner

## 2020-07-10 NOTE — Telephone Encounter (Signed)
Spoke with patient when reviewing lab results and he did mention needing a referral for urologist.

## 2020-07-10 NOTE — Telephone Encounter (Signed)
Patient notified and verbalized understanding. 

## 2020-07-10 NOTE — Telephone Encounter (Signed)
Referral was already entered He will be contacted by urology office to schedule appt

## 2020-07-10 NOTE — Telephone Encounter (Signed)
Patient would like call back regarding his recent urinalysis. He stated the results showing on his mychart are concerning to him.

## 2020-07-10 NOTE — Telephone Encounter (Signed)
Patient needs referral for Urology. States he discussed that with Baldo Ash at his recent appt.

## 2020-07-10 NOTE — Telephone Encounter (Signed)
Patient received phone call

## 2020-07-11 ENCOUNTER — Other Ambulatory Visit: Payer: Self-pay

## 2020-07-11 ENCOUNTER — Ambulatory Visit (INDEPENDENT_AMBULATORY_CARE_PROVIDER_SITE_OTHER): Payer: BC Managed Care – PPO

## 2020-07-11 DIAGNOSIS — E538 Deficiency of other specified B group vitamins: Secondary | ICD-10-CM | POA: Diagnosis not present

## 2020-07-11 MED ORDER — CYANOCOBALAMIN 1000 MCG/ML IJ SOLN
1000.0000 ug | Freq: Once | INTRAMUSCULAR | Status: AC
  Administered 2020-07-11: 1000 ug via INTRAMUSCULAR

## 2020-07-11 NOTE — Progress Notes (Signed)
Per orders of Dr. Cruzita Lederer injection of Vitamin B12 given today by N.Bladimir Auman,LPN. Patient tolerated injection well.

## 2020-07-12 DIAGNOSIS — R350 Frequency of micturition: Secondary | ICD-10-CM | POA: Diagnosis not present

## 2020-07-12 DIAGNOSIS — R311 Benign essential microscopic hematuria: Secondary | ICD-10-CM | POA: Diagnosis not present

## 2020-07-15 ENCOUNTER — Ambulatory Visit (INDEPENDENT_AMBULATORY_CARE_PROVIDER_SITE_OTHER): Payer: BC Managed Care – PPO | Admitting: Internal Medicine

## 2020-07-15 DIAGNOSIS — G4733 Obstructive sleep apnea (adult) (pediatric): Secondary | ICD-10-CM | POA: Diagnosis not present

## 2020-07-15 DIAGNOSIS — Z6833 Body mass index (BMI) 33.0-33.9, adult: Secondary | ICD-10-CM | POA: Diagnosis not present

## 2020-07-15 DIAGNOSIS — Z9989 Dependence on other enabling machines and devices: Secondary | ICD-10-CM | POA: Diagnosis not present

## 2020-07-15 NOTE — Addendum Note (Signed)
Addended by: Devona Konig on: 07/15/2020 05:22 PM   Modules accepted: Level of Service

## 2020-07-15 NOTE — Progress Notes (Signed)
[facility] [place], [place] [address]  Pulmonary Sleep Medicine   Office Visit Note  Patient Name: Terry Bonilla DOB: [birth year] MRN [id number]  Date of Service: 07/15/2020  Chief Complaint: Obstructive Sleep Apnea visit  Brief History:  Terry Bonilla is seen today for OSA and CPAP compliance follow-up. The patient has a 12 year history of sleep apnea. Patient is using PAP nightly.  The patient feels better after sleeping with PAP.  The patient reports benefit from PAP use. Reported sleepiness is well controlled and the Epworth Sleepiness Score is 1 out of 24. The patient does not routinely take naps. The patient complains of the following: mask leak that is worse some nights that makes sleeping uncomfortable.  The compliance download shows  compliance with an average use time of 9  hours. The AHI is 3.3  The patient does not complain of limb movements disrupting sleep.  ROS  General: (-) fever, (-) chills, (-) night sweat Nose and Sinuses: (-) nasal stuffiness or itchiness, (-) postnasal drip, (-) nosebleeds, (-) sinus trouble. Mouth and Throat: (-) sore throat, (-) hoarseness. Neck: (-) swollen glands, (-) enlarged thyroid, (-) neck pain. Respiratory: - cough, - shortness of breath, - wheezing. Neurologic: - numbness, - tingling. Psychiatric: - anxiety, - depression   Current Medication: Outpatient Encounter Medications as of 07/15/2020  Medication Sig   ammonium lactate (AMLACTIN) 12 % cream Apply topically as needed for dry skin.   atorvastatin (LIPITOR) 10 MG tablet Take 1 tablet (10 mg total) by mouth daily. (Patient taking differently: Take 10 mg by mouth daily. Takes at night)   betamethasone dipropionate 0.05 % cream Apply 1 application topically as needed.   blood glucose meter kit and supplies KIT Dispense based on patient and insurance preference. Use up to four times daily as directed. (FOR ICD-9 250.00, 250.01).   finasteride (PROPECIA) 1 MG  tablet Take 1 tablet by mouth daily.   glucose blood (FREESTYLE TEST STRIPS) test strip Check blood sugar BID and PRN   metFORMIN (GLUCOPHAGE-XR) 750 MG 24 hr tablet Take 1500 mg by mouth with dinner (Patient taking differently: Take 750 mg by mouth. Take 1 tablet with breakfast and 1 tablet with dinner.)   Travoprost, BAK Free, (TRAVATAN) 0.004 % SOLN ophthalmic solution 1 drop at bedtime.   triamcinolone (KENALOG) 0.025 % ointment Apply 1 application topically 2 (two) times daily. Not for face or private areas.   TRUEplus Lancets 30G MISC USE TO TEST BLOOD SUGARS 1-2 TIMES DAILY.   UNABLE TO FIND Gets Vitamin B-12 i4/njections  Last injection was on 02/02/20; next one is 03/05/20  Doing for the next 6 months   Urea 40 % GEL Apply 1 application topically daily.   No facility-administered encounter medications on file as of 07/15/2020.    Surgical History: Past Surgical History:  Procedure Laterality Date   TONSILLECTOMY     age 66    Medical History: Past Medical History:  Diagnosis Date   Diabetes mellitus without complication ([place]) 70/6237   pre-diabetes A1C 6.4   Sleep apnea 2009   cpap   Substance abuse ([place])    ETOH    Family History: Non contributory to the present illness  Social History: Social History   Socioeconomic History   Marital status: Unknown    Spouse name: Not on file   Number of children: Not on file   Years of education: Not on file   Highest education level: Not on file  Occupational  History   Not on file  Tobacco Use   Smoking status: Never Smoker   Smokeless tobacco: Never Used  Vaping Use   Vaping Use: Never used  Substance and Sexual Activity   Alcohol use: Yes    Alcohol/week: 2.0 standard drinks    Types: 1 Glasses of wine, 1 Cans of beer per week    Comment: two drinks nightly occassionally    Drug use: Never   Sexual activity: Not on file  Other Topics Concern   Not on file  Social History Narrative    Not on file   Social Determinants of Health   Financial Resource Strain:    Difficulty of Paying Living Expenses: Not on file  Food Insecurity:    Worried About Running Out of Food in the Last Year: Not on file   Ran Out of Food in the Last Year: Not on file  Transportation Needs:    Lack of Transportation (Medical): Not on file   Lack of Transportation (Non-Medical): Not on file  Physical Activity:    Days of Exercise per Week: Not on file   Minutes of Exercise per Session: Not on file  Stress:    Feeling of Stress : Not on file  Social Connections:    Frequency of Communication with Friends and Family: Not on file   Frequency of Social Gatherings with Friends and Family: Not on file   Attends Religious Services: Not on file   Active Member of Clubs or Organizations: Not on file   Attends Archivist Meetings: Not on file   Marital Status: Not on file  Intimate Partner Violence:    Fear of Current or Ex-Partner: Not on file   Emotionally Abused: Not on file   Physically Abused: Not on file   Sexually Abused: Not on file    Vital Signs: Blood pressure (!) 173/95, pulse (!) 105, weight 271 lb (122.9 kg), SpO2 98 %.  Examination: General Appearance: The patient is well-developed, well-nourished, and in no distress. Neck Circumference: 56 Skin: Gross inspection of skin unremarkable. Head: normocephalic, no gross deformities. Eyes: no gross deformities noted. ENT: ears appear grossly normal Neurologic: Alert and oriented. No involuntary movements.    EPWORTH SLEEPINESS SCALE:  Scale:  (0)= no chance of dozing; (1)= slight chance of dozing; (2)= moderate chance of dozing; (3)= high chance of dozing  Chance  Situtation    Sitting and reading: 0    Watching TV: 0    Sitting Inactive in public: 0    As a passenger in car: 0      Lying down to rest: 1    Sitting and talking: 0    Sitting quielty after lunch: 0    In a car, stopped in  traffic: 0   TOTAL SCORE:   1 out of 24    SLEEP STUDIES:  1. PSG 05/2008, AHI 46.1, SpO2 minimum 78.5%, CPAP 03/2016, recommend 10 cm H2O   CPAP COMPLIANCE DATA:  Date Range: 07/12/2019-07/10/2020  Average Daily Use: 9 hours  Median Use: 9 hours  Compliance for > 4 Hours: 365/365 days  AHI: 3.3 respiratory events per hour  Days Used: 365/365  Mask Leak: 42.7  LABS: Recent Results (from the past 2160 hour(s))  Vitamin B12     Status: None   Collection Time: 05/14/20  8:19 AM  Result Value Ref Range   Vitamin B-12 389 211 - 911 pg/mL  Ferritin     Status: None  Collection Time: 05/14/20  8:19 AM  Result Value Ref Range   Ferritin 186.7 22.0 - 322.0 ng/mL  Vitamin B1     Status: None   Collection Time: 06/17/20  8:46 AM  Result Value Ref Range   Vitamin B1 (Thiamine) 10 8 - 30 nmol/L    Comment: Marland Kitchen Vitamin supplementation within 24 hours prior to blood draw may affect the accuracy of the results. . This test was developed and its analytical performance characteristics have been determined by [place], [place]. It has not been cleared or approved by the U.S. Food and Drug Administration. This assay has been validated pursuant to the CLIA regulations and is used for clinical purposes. .   Urine Culture     Status: None   Collection Time: 06/25/20  9:25 AM   Specimen: Urine  Result Value Ref Range   MICRO NUMBER: [id number]    SPECIMEN QUALITY: Adequate    Sample Source NOT GIVEN    STATUS: FINAL    Result: No Growth   CBC w/Diff     Status: None   Collection Time: 06/25/20  9:25 AM  Result Value Ref Range   WBC 5.3 4.0 - 10.5 K/uL   RBC 5.30 4.22 - 5.81 Mil/uL   Hemoglobin 16.3 13.0 - 17.0 g/dL   HCT 46.7 39 - 52 %   MCV 88.1 78.0 - 100.0 fl   MCHC 34.9 30.0 - 36.0 g/dL   RDW 13.0 11.5 - 15.5 %   Platelets 175.0 150 - 400 K/uL   Neutrophils Relative % 60.5 43 - 77 %   Lymphocytes Relative 29.0 12 - 46 %   Monocytes  Relative 6.3 3 - 12 %   Eosinophils Relative 3.0 0 - 5 %   Basophils Relative 1.2 0 - 3 %   Neutro Abs 3.2 1.4 - 7.7 K/uL   Lymphs Abs 1.5 0.7 - 4.0 K/uL   Monocytes Absolute 0.3 0 - 1 K/uL   Eosinophils Absolute 0.2 0 - 0 K/uL   Basophils Absolute 0.1 0 - 0 K/uL  C-reactive protein     Status: None   Collection Time: 06/25/20  9:25 AM  Result Value Ref Range   CRP <1.0 0.5 - 20.0 mg/dL  Basic metabolic panel     Status: Abnormal   Collection Time: 06/25/20  9:25 AM  Result Value Ref Range   Sodium 135 135 - 145 mEq/L   Potassium 4.3 3.5 - 5.1 mEq/L   Chloride 103 96 - 112 mEq/L   CO2 25 19 - 32 mEq/L   Glucose, Bld 145 (H) 70 - 99 mg/dL   BUN 14 6 - 23 mg/dL   Creatinine, Ser 0.78 0.40 - 1.50 mg/dL   GFR 104.83 >60.00 mL/min   Calcium 9.4 8.4 - 10.5 mg/dL  POCT urinalysis dipstick     Status: Abnormal   Collection Time: 06/25/20  9:36 AM  Result Value Ref Range   Color, UA dark yellow    Clarity, UA cloudy    Glucose, UA Negative Negative   Bilirubin, UA neg    Ketones, UA neg    Spec Grav, UA >=1.030 (A) 1.010 - 1.025   Blood, UA neg    pH, UA 5.5 5.0 - 8.0   Protein, UA Positive (A) Negative    Comment: 67m   Urobilinogen, UA 0.2 0.2 or 1.0 E.U./dL   Nitrite, UA neg    Leukocytes, UA Negative Negative   Appearance cloudy  Odor strong   Urinalysis w microscopic + reflex cultur     Status: Abnormal   Collection Time: 07/08/20  8:35 AM   Specimen: Urine  Result Value Ref Range   Color, Urine DARK YELLOW YELLOW   APPearance CLEAR CLEAR   Specific Gravity, Urine 1.019 1.001 - 1.03   pH < OR = 5.0 5.0 - 8.0   Glucose, UA NEGATIVE NEGATIVE   Bilirubin Urine NEGATIVE NEGATIVE   Ketones, ur NEGATIVE NEGATIVE   Hgb urine dipstick 2+ (A) NEGATIVE   Protein, ur TRACE (A) NEGATIVE   Nitrites, Initial NEGATIVE NEGATIVE   Leukocyte Esterase NEGATIVE NEGATIVE   WBC, UA NONE SEEN 0 - 5 /HPF   RBC / HPF 10-20 (A) 0 - 2 /HPF   Squamous Epithelial / LPF NONE SEEN < OR = 5  /HPF   Bacteria, UA FEW (A) NONE SEEN /HPF   Hyaline Cast NONE SEEN NONE SEEN /LPF  REFLEXIVE URINE CULTURE     Status: None   Collection Time: 07/08/20  8:35 AM  Result Value Ref Range   Reflexve Urine Culture      Comment: NO CULTURE INDICATED    Radiology: US Abdomen Limited RUQ  Result Date: 11/29/2019 CLINICAL DATA:  Elevated liver enzymes EXAM: ULTRASOUND ABDOMEN LIMITED RIGHT UPPER QUADRANT COMPARISON:  None. FINDINGS: Gallbladder: No gallstones or wall thickening visualized. There is no pericholecystic fluid. There is mild sludge in the gallbladder. No sonographic Murphy sign noted by sonographer. Common bile duct: Diameter: 5 mm. No intrahepatic or extrahepatic biliary duct dilatation. Liver: No focal lesion identified. Within normal limits in parenchymal echogenicity. Portal vein is patent on color Doppler imaging with normal direction of blood flow towards the liver. Other: None. IMPRESSION: Mild sludge in gallbladder. No gallstones, gallbladder wall thickening, or pericholecystic fluid. Study otherwise unremarkable. Electronically Signed   By: Lowella Grip III M.D.   On: 11/29/2019 09:03    No results found.  NCV with EMG(electromyography)  Result Date: 06/26/2020 Alda Berthold, DO     06/26/2020  8:45 AM [facility] [address]  [place], [place] [address] Tel: [phone] Fax:  [phone] Test Date:  06/26/2020 Patient: Adi Doro DOB: Mar 28, 1971 Physician: Narda Amber, DO Sex: Male Height: _0  Ref Phys: Narda Amber, DO ID#: [id number] Temp: 35.0C Technician:  Patient Complaints: This is a 49 year old man with history of diabetes and vitamin B12 deficiency referred for evaluation of bilateral feet numbness and tingling. NCV & EMG Findings: Electrodiagnostic testing of the right lower extremity and additional studies of the left shows: 1. Bilateral superficial peroneal sensory responses are borderline low, but remain within the normal ranges.   Bilateral sural sensory responses are within normal limits.  2. Bilateral peroneal and tibial motor responses are within normal limits. 3. Bilateral tibial H reflex studies are within normal limits. 4. There is no evidence of active or chronic motor axonal changes affecting any of the tested muscles.  Motor unit configuration and recruitment pattern is within normal limits. Impression: This is a normal study of the lower extremities.  In particular, there is no evidence of a sensorimotor polyneuropathy or lumbosacral radiculopathy.  ___________________________ Narda Amber, DO Nerve Conduction Studies Anti Sensory Summary Table  Stim Site NR Peak (ms) Norm Peak (ms) P-T Amp (V) Norm P-T Amp Left Sup Peroneal Anti Sensory (Ant Lat Mall)  35C 12 cm    3.6 <4.6 5.4 >4 Right Sup Peroneal Anti Sensory (Ant Lat Mall)  35C  12 cm    2.8 <4.6 4.1 >4 Left Sural Anti Sensory (Lat Mall)  35C Calf    3.5 <4.6 8.0 >4 Right Sural Anti Sensory (Lat Mall)  35C Calf    3.1 <4.6 8.4 >4 Motor Summary Table  Stim Site NR Onset (ms) Norm Onset (ms) O-P Amp (mV) Norm O-P Amp Site1 Site2 Delta-0 (ms) Dist (cm) Vel (m/s) Norm Vel (m/s) Left Peroneal Motor (Ext Dig Brev)  35C Ankle    4.4 <6.0 4.7 >2.5 B Fib Ankle 8.5 39.0 46 >40 B Fib    12.9  4.1  Poplt B Fib 1.9 10.0 53 >40 Poplt    14.8  3.9        Right Peroneal Motor (Ext Dig Brev)  35C Ankle    3.6 <6.0 4.5 >2.5 B Fib Ankle 9.5 38.0 40 >40 B Fib    13.1  4.1  Poplt B Fib 2.4 10.0 42 >40 Poplt    15.5  3.6        Left Tibial Motor (Abd Hall Brev)  35C Ankle    4.9 <6.0 7.9 >4 Knee Ankle 9.5 45.0 47 >40 Knee    14.4  5.6        Right Tibial Motor (Abd Hall Brev)  35C Ankle    3.2 <6.0 6.9 >4 Knee Ankle 10.8 46.0 43 >40 Knee    14.0  5.2        H Reflex Studies  NR H-Lat (ms) Lat Norm (ms) L-R H-Lat (ms) Left Tibial (Gastroc)  35C    34.83 <35 0.00 Right Tibial (Gastroc)  35C    34.83 <35 0.00 EMG  Side Muscle Ins Act Fibs Psw Fasc Number Recrt Dur Dur. Amp Amp. Poly Poly.  Comment Right AntTibialis _0  _1  Nml Nml N/A Right Gastroc _2  _3  Nml Nml N/A Right Flex Dig Long _4  _5  Nml Nml N/A Right RectFemoris _6  _7  Nml Nml N/A Right BicepsFemS _8  _9  Nml Nml N/A Left BicepsFemS _10  _11  Nml Nml N/A Left AntTibialis _12  _13  Nml Nml N/A Left Gastroc _14  _15  Nml Nml N/A Left Flex Dig Long _16  _17  Nml Nml N/A Left RectFemoris _18  _19  Nml Nml N/A Waveforms:                Assessment and Plan: Patient Active Problem List   Diagnosis Date Noted   Ingrown toenail 03/04/2020   B12 deficiency 01/31/2020   Neuropathy 01/31/2020   Elevated LDL cholesterol level 01/26/2020   Flexural eczema 01/26/2020   Lightheadedness 11/28/2019   Elevated liver enzymes 11/28/2019   Alcohol use 11/28/2019   Palpitations 10/30/2019   Type 2 diabetes mellitus with diabetic neuropathy, without long-term current use of insulin ([place]) 10/23/2019   Screening for blood disease 10/23/2019   Need for influenza vaccination 10/23/2019   Atypical nevus 10/23/2019   Obesity, Class II, BMI 35-39.9 03/20/2016    The patient overall does tolerate PAP and reports benefit from PAP use. The patient was reminded how to clean machine by hand and to  allow water chamber to dry daily and advised to try a different seal such as one that has memory foam to improve comfort and prevent air leakage. The compliance is great. The AHI is 3.3. Will work with him to improve seal comfort to help prevent leakage before adjusting his pressures.  1. OSA on CPAP Continue with CPAP at current therapy, will work to help improve seal comfort to decrease the amount of leak. If leak continues to be  high may need to increase his pressures.  2. BMI 33.0-33.9,adult Encouraged to work on incorporating health eating habits and daily exercise into his routine. Discussed the negative effects obesity has on his OSA, cardiovascular and overall health.  General Counseling: I have discussed the findings of the evaluation and examination with Ovid Curd.  I have also discussed any further diagnostic evaluation thatmay be needed or ordered today. Jasmin verbalizes understanding of the findings of todays visit. We also reviewed his medications today and discussed drug interactions and side effects including but not limited excessive drowsiness and altered mental states. We also discussed that there is always a risk not just to him but also people around him. he has been encouraged to call the office with any questions or concerns that should arise related to todays visit.  I have personally obtained a history, examined the patient, evaluated laboratory and imaging results, formulated the assessment and plan and placed orders.  This patient was seen by Theodoro Grist AGNP-C in Collaboration with Dr. Devona Konig as a part of collaborative care agreement.  Richelle Ito Saunders Glance, PhD, FAASM  Diplomate, American Board of Sleep Medicine    Allyne Gee, MD [facility] Diplomate ABMS Pulmonary and Critical Care Medicine Sleep medicine

## 2020-07-15 NOTE — Patient Instructions (Signed)

## 2020-07-18 DIAGNOSIS — G4733 Obstructive sleep apnea (adult) (pediatric): Secondary | ICD-10-CM | POA: Diagnosis not present

## 2020-07-19 ENCOUNTER — Telehealth: Payer: Self-pay | Admitting: Family Medicine

## 2020-07-19 DIAGNOSIS — E1140 Type 2 diabetes mellitus with diabetic neuropathy, unspecified: Secondary | ICD-10-CM

## 2020-07-19 NOTE — Telephone Encounter (Signed)
Patient is calling and stated that his insurance is requesting that patient switch to a  Free Style Lite Meter, Free Style Lancing Device, Free Style Lite Test Strips and Free Style Lite Lancets sent to [facility] on [address], please advise. CB is [phone].

## 2020-07-22 DIAGNOSIS — R3129 Other microscopic hematuria: Secondary | ICD-10-CM | POA: Diagnosis not present

## 2020-07-22 DIAGNOSIS — R93422 Abnormal radiologic findings on diagnostic imaging of left kidney: Secondary | ICD-10-CM | POA: Diagnosis not present

## 2020-07-22 DIAGNOSIS — N281 Cyst of kidney, acquired: Secondary | ICD-10-CM | POA: Diagnosis not present

## 2020-07-22 DIAGNOSIS — R311 Benign essential microscopic hematuria: Secondary | ICD-10-CM | POA: Diagnosis not present

## 2020-07-23 MED ORDER — FREESTYLE FREEDOM LITE W/DEVICE KIT: 1.0000 | PACK | 0 refills | Status: AC | PRN

## 2020-07-23 MED ORDER — FREESTYLE LANCETS MISC: 1.0000 | 0 refills | Status: DC | PRN

## 2020-07-23 MED ORDER — FREESTYLE TEST VI STRP: ORAL_STRIP | 3 refills | Status: DC

## 2020-07-23 NOTE — Addendum Note (Signed)
Addended by: Lynda Rainwater on: 07/23/2020 04:29 PM   Modules accepted: Orders

## 2020-07-23 NOTE — Telephone Encounter (Signed)
Patient states these prescriptions have not yet been called in to pharmacy. He also called on 07/19/20 and left a message asking for these supplies to be sent.

## 2020-07-24 ENCOUNTER — Other Ambulatory Visit: Payer: Self-pay

## 2020-07-24 MED ORDER — FREESTYLE LITE TEST VI STRP: 1.0000 | ORAL_STRIP | 0 refills | Status: DC | PRN

## 2020-07-24 NOTE — Telephone Encounter (Signed)
Requested diabetes supplies sent to pharmacy.

## 2020-07-24 NOTE — Telephone Encounter (Signed)
Contacted [facility] pharmacy and spoke with Bowie. Amended test strips to [employer]. Lancing device is included with meter.   Contacted patient and provided this information. Patient will pick up diabetic supplies from pharmacy.   Patient's concerns at this time have been resolved.

## 2020-08-01 ENCOUNTER — Other Ambulatory Visit: Payer: Self-pay

## 2020-08-02 ENCOUNTER — Ambulatory Visit (INDEPENDENT_AMBULATORY_CARE_PROVIDER_SITE_OTHER): Payer: BC Managed Care – PPO | Admitting: Family Medicine

## 2020-08-02 ENCOUNTER — Encounter: Payer: Self-pay | Admitting: Family Medicine

## 2020-08-02 VITALS — BP 134/76 | HR 98 | Temp 96.9°F | Ht 75.0 in | Wt 264.8 lb

## 2020-08-02 DIAGNOSIS — E538 Deficiency of other specified B group vitamins: Secondary | ICD-10-CM | POA: Diagnosis not present

## 2020-08-02 DIAGNOSIS — E7800 Pure hypercholesterolemia, unspecified: Secondary | ICD-10-CM

## 2020-08-02 DIAGNOSIS — R42 Dizziness and giddiness: Secondary | ICD-10-CM | POA: Diagnosis not present

## 2020-08-02 LAB — CBC
HCT: 46.2 % (ref 39.0–52.0)
Hemoglobin: 16.2 g/dL (ref 13.0–17.0)
MCHC: 35.1 g/dL (ref 30.0–36.0)
MCV: 87.5 fl (ref 78.0–100.0)
Platelets: 178 10*3/uL (ref 150.0–400.0)
RBC: 5.28 Mil/uL (ref 4.22–5.81)
RDW: 12.6 % (ref 11.5–15.5)
WBC: 5.3 10*3/uL (ref 4.0–10.5)

## 2020-08-02 LAB — URINALYSIS, ROUTINE W REFLEX MICROSCOPIC
Bilirubin Urine: NEGATIVE
Hgb urine dipstick: NEGATIVE
Ketones, ur: NEGATIVE
Leukocytes,Ua: NEGATIVE
Nitrite: NEGATIVE
RBC / HPF: NONE SEEN (ref 0–?)
Specific Gravity, Urine: >=1.03 — AB (ref 1.000–1.030)
Total Protein, Urine: NEGATIVE
Urine Glucose: NEGATIVE
Urobilinogen, UA: 0.2 (ref 0.0–1.0)
pH: 5.5 (ref 5.0–8.0)

## 2020-08-02 LAB — COMPREHENSIVE METABOLIC PANEL
ALT: 40 U/L (ref 0–53)
AST: 24 U/L (ref 0–37)
Albumin: 4.7 g/dL (ref 3.5–5.2)
Alkaline Phosphatase: 84 U/L (ref 39–117)
BUN: 14 mg/dL (ref 6–23)
CO2: 28 mEq/L (ref 19–32)
Calcium: 9.4 mg/dL (ref 8.4–10.5)
Chloride: 102 mEq/L (ref 96–112)
Creatinine, Ser: 0.89 mg/dL (ref 0.40–1.50)
GFR: 89.99 mL/min (ref >60.00)
Glucose, Bld: 138 mg/dL — ABNORMAL HIGH (ref 70–99)
Potassium: 4.3 mEq/L (ref 3.5–5.1)
Sodium: 138 mEq/L (ref 135–145)
Total Bilirubin: 1 mg/dL (ref 0.2–1.2)
Total Protein: 6.8 g/dL (ref 6.0–8.3)

## 2020-08-02 LAB — LIPID PANEL
Cholesterol: 113 mg/dL (ref 0–200)
HDL: 28.6 mg/dL — ABNORMAL LOW (ref >39.00)
LDL Cholesterol: 64 mg/dL (ref 0–99)
NonHDL: 84.57
Total CHOL/HDL Ratio: 4
Triglycerides: 103 mg/dL (ref 0.0–149.0)
VLDL: 20.6 mg/dL (ref 0.0–40.0)

## 2020-08-02 LAB — VITAMIN B12: Vitamin B-12: 473 pg/mL (ref 211–911)

## 2020-08-02 NOTE — Progress Notes (Signed)
Established Patient Office Visit  Subjective:  Patient ID: Terry Bonilla, male    DOB: 28-Feb-1971  Age: 49 y.o. MRN: [id number]  CC:  Chief Complaint  Patient presents with  . Follow-up    6 month follow up, patient states that he still has some light headness feelings that come and go.    HPI [name] presents for follow-up of elevated ldl cholesterol, B12 deficiency lightheadedness lightheadedness persists on occasion.  Rarely associated with a spinning sensation.  Rarely associated with heart palpitations.  Status post cardiology and neurology work-ups.  Urines have consistently been concentrated.  He assures me that he is drinking 65 ounces of water daily.  Currently seeing urology for hematuria and what he tells me was a filling defect in one of his kidneys.  Further work-up is planned.  Fasting blood sugars have been averaging around 140 or so.  Past Medical History:  Diagnosis Date  . Diabetes mellitus without complication ([place]) 32/6712   pre-diabetes A1C 6.4  . Sleep apnea 2009   cpap  . Substance abuse ([place])    ETOH    Past Surgical History:  Procedure Laterality Date  . TONSILLECTOMY     age 8    Family History  Problem Relation Age of Onset  . Ovarian cancer Mother   . Arrhythmia Father   . Diabetes type II Brother   . Colon cancer Neg Hx   . Colon polyps Neg Hx   . Esophageal cancer Neg Hx   . Rectal cancer Neg Hx   . Stomach cancer Neg Hx     Social History   Socioeconomic History  . Marital status: Unknown    Spouse name: Not on file  . Number of children: Not on file  . Years of education: Not on file  . Highest education level: Not on file  Occupational History  . Not on file  Tobacco Use  . Smoking status: Never Smoker  . Smokeless tobacco: Never Used  Vaping Use  . Vaping Use: Never used  Substance and Sexual Activity  . Alcohol use: Yes    Alcohol/week: 2.0 standard drinks    Types: 1 Glasses of wine, 1 Cans of beer per week     Comment: two drinks nightly occassionally   . Drug use: Never  . Sexual activity: Not on file  Other Topics Concern  . Not on file  Social History Narrative  . Not on file   Social Determinants of Health   Financial Resource Strain:   . Difficulty of Paying Living Expenses: Not on file  Food Insecurity:   . Worried About Charity fundraiser in the Last Year: Not on file  . Ran Out of Food in the Last Year: Not on file  Transportation Needs:   . Lack of Transportation (Medical): Not on file  . Lack of Transportation (Non-Medical): Not on file  Physical Activity:   . Days of Exercise per Week: Not on file  . Minutes of Exercise per Session: Not on file  Stress:   . Feeling of Stress : Not on file  Social Connections:   . Frequency of Communication with Friends and Family: Not on file  . Frequency of Social Gatherings with Friends and Family: Not on file  . Attends Religious Services: Not on file  . Active Member of Clubs or Organizations: Not on file  . Attends Archivist Meetings: Not on file  . Marital Status: Not on file  Intimate Partner Violence:   . Fear of Current or Ex-Partner: Not on file  . Emotionally Abused: Not on file  . Physically Abused: Not on file  . Sexually Abused: Not on file    Outpatient Medications Prior to Visit  Medication Sig Dispense Refill  . ammonium lactate (AMLACTIN) 12 % cream Apply topically as needed for dry skin. 385 g 0  . atorvastatin (LIPITOR) 10 MG tablet Take 1 tablet (10 mg total) by mouth daily. (Patient taking differently: Take 10 mg by mouth daily. Takes at night) 90 tablet 3  . betamethasone dipropionate 0.05 % cream Apply 1 application topically as needed.    . Blood Glucose Monitoring Suppl (FREESTYLE FREEDOM LITE) w/Device KIT 1 kit by Does not apply route as needed. 1 kit 0  . finasteride (PROPECIA) 1 MG tablet Take 1 tablet by mouth daily.    Marland Kitchen glucose blood (FREESTYLE LITE) test strip 1 each by Other route as  needed for other. Use as instructed 100 each 0  . Lancets (FREESTYLE) lancets 1 each by Other route as needed for other. Use as instructed 100 each 0  . metFORMIN (GLUCOPHAGE-XR) 750 MG 24 hr tablet Take 1500 mg by mouth with dinner (Patient taking differently: Take 750 mg by mouth. Take 1 tablet with breakfast and 1 tablet with dinner.) 180 tablet 3  . Travoprost, BAK Free, (TRAVATAN) 0.004 % SOLN ophthalmic solution 1 drop at bedtime.    . triamcinolone (KENALOG) 0.025 % ointment Apply 1 application topically 2 (two) times daily. Not for face or private areas. 80 g 0  . UNABLE TO FIND Gets Vitamin B-12 i4/njections  Last injection was on 02/02/20; next one is 03/05/20  Doing for the next 6 months    . Urea 40 % GEL Apply 1 application topically daily. 15 mL 2   No facility-administered medications prior to visit.    Allergies  Allergen Reactions  . Crab [Shellfish Allergy] Hives  . Sulfa Antibiotics Hives  . Sulfur Rash    ROS Review of Systems  Constitutional: Negative.   HENT: Negative.   Eyes: Negative for photophobia and visual disturbance.  Respiratory: Negative.   Cardiovascular: Negative.   Gastrointestinal: Negative.   Genitourinary: Negative.   Musculoskeletal: Negative for gait problem and joint swelling.  Skin: Negative for pallor and rash.  Allergic/Immunologic: Negative for immunocompromised state.  Neurological: Positive for light-headedness. Negative for dizziness, tremors and speech difficulty.  Hematological: Does not bruise/bleed easily.  Psychiatric/Behavioral: Negative.       Objective:    Physical Exam Vitals and nursing note reviewed.  Constitutional:      General: He is not in acute distress.    Appearance: Normal appearance. He is obese. He is not ill-appearing, toxic-appearing or diaphoretic.  HENT:     Head: Normocephalic and atraumatic.     Right Ear: Tympanic membrane, ear canal and external ear normal.     Left Ear: Tympanic membrane, ear  canal and external ear normal.  Eyes:     General: No scleral icterus.       Right eye: No discharge.        Left eye: No discharge.     Extraocular Movements: Extraocular movements intact.     Conjunctiva/sclera: Conjunctivae normal.     Pupils: Pupils are equal, round, and reactive to light.  Cardiovascular:     Rate and Rhythm: Normal rate and regular rhythm.  Pulmonary:     Effort: Pulmonary effort is normal.  Breath sounds: Normal breath sounds.  Abdominal:     General: Bowel sounds are normal.  Musculoskeletal:     Cervical back: No rigidity or tenderness.  Lymphadenopathy:     Cervical: No cervical adenopathy.  Skin:    General: Skin is warm and dry.  Neurological:     Mental Status: He is alert and oriented to person, place, and time.  Psychiatric:        Mood and Affect: Mood normal.        Behavior: Behavior normal.     BP 134/76   Pulse 98   Temp (!) 96.9 F (36.1 C) (Tympanic)   Ht _0  (1.905 m)   Wt 264 lb 12.8 oz (120.1 kg)   SpO2 99%   BMI 33.10 kg/m  Wt Readings from Last 3 Encounters:  08/02/20 264 lb 12.8 oz (120.1 kg)  07/15/20 271 lb (122.9 kg)  06/25/20 271 lb (122.9 kg)     Health Maintenance Due  Topic Date Due  . Hepatitis C Screening  Never done  . PNEUMOCOCCAL POLYSACCHARIDE VACCINE AGE 50-64 HIGH RISK  Never done  . INFLUENZA VACCINE  06/23/2020    There are no preventive care reminders to display for this patient.  Lab Results  Component Value Date   TSH 1.420 11/27/2019   Lab Results  Component Value Date   WBC 5.3 06/25/2020   HGB 16.3 06/25/2020   HCT 46.7 06/25/2020   MCV 88.1 06/25/2020   PLT 175.0 06/25/2020   Lab Results  Component Value Date   NA 135 06/25/2020   K 4.3 06/25/2020   CO2 25 06/25/2020   GLUCOSE 145 (H) 06/25/2020   BUN 14 06/25/2020   CREATININE 0.78 06/25/2020   BILITOT 0.7 11/28/2019   ALKPHOS 72 11/28/2019   AST 25 11/28/2019   ALT 45 11/28/2019   PROT 6.6 11/28/2019   ALBUMIN 4.5  11/28/2019   CALCIUM 9.4 06/25/2020   ANIONGAP 9 10/18/2019   GFR 104.83 06/25/2020   Lab Results  Component Value Date   CHOL 160 10/23/2019   Lab Results  Component Value Date   HDL 27.30 (L) 10/23/2019   Lab Results  Component Value Date   LDLCALC 113 (H) 10/23/2019   Lab Results  Component Value Date   TRIG 97.0 10/23/2019   Lab Results  Component Value Date   CHOLHDL 6 10/23/2019   Lab Results  Component Value Date   HGBA1C 6.6 (A) 04/01/2020      Assessment & Plan:   Problem List Items Addressed This Visit      Other   Lightheadedness   Relevant Orders   Orthostatic vital signs   Urinalysis, Routine w reflex microscopic   CBC   Elevated LDL cholesterol level   Relevant Orders   Comprehensive metabolic panel   Lipid panel   B12 deficiency - Primary   Relevant Orders   Vitamin B12      No orders of the defined types were placed in this encounter.   Follow-up: Return in about 6 months (around 01/30/2021).   Patient's blood pressures did not vary moving from lying to sitting to standing.  Pulse rate increased slightly moving from lying to sitting but then decreased slightly after standing.  Dizziness(not lightheadedness) is listed as a sided effect for metformin. He is seeing endocrinology. I asked him to check with them about changing med.  Libby Maw, MD

## 2020-08-05 ENCOUNTER — Encounter: Payer: Self-pay | Admitting: Internal Medicine

## 2020-08-05 ENCOUNTER — Ambulatory Visit (INDEPENDENT_AMBULATORY_CARE_PROVIDER_SITE_OTHER): Payer: BC Managed Care – PPO | Admitting: Internal Medicine

## 2020-08-05 ENCOUNTER — Other Ambulatory Visit: Payer: Self-pay

## 2020-08-05 VITALS — BP 140/90 | HR 102 | Ht 75.0 in | Wt 266.0 lb

## 2020-08-05 DIAGNOSIS — E538 Deficiency of other specified B group vitamins: Secondary | ICD-10-CM | POA: Diagnosis not present

## 2020-08-05 DIAGNOSIS — E785 Hyperlipidemia, unspecified: Secondary | ICD-10-CM

## 2020-08-05 DIAGNOSIS — E1140 Type 2 diabetes mellitus with diabetic neuropathy, unspecified: Secondary | ICD-10-CM | POA: Diagnosis not present

## 2020-08-05 LAB — POCT GLYCOSYLATED HEMOGLOBIN (HGB A1C): Hemoglobin A1C: 6.8 % — AB (ref 4.0–5.6)

## 2020-08-05 MED ORDER — RYBELSUS 3 MG PO TABS: ORAL_TABLET | ORAL | 3 refills | Status: DC

## 2020-08-05 NOTE — Progress Notes (Signed)
Patient ID: Terry Bonilla, male   DOB: Apr 21, 1971, 49 y.o.   MRN: [id number]   This visit occurred during the SARS-CoV-2 public health emergency.  Safety protocols were in place, including screening questions prior to the visit, additional usage of staff PPE, and extensive cleaning of exam room while observing appropriate contact time as indicated for disinfecting solutions.   HPI: Terry Bonilla is a 49 y.o.-year-old male, referred by his PCP, Dr. Ethelene Hal, for management of DM2, dx on 10/15/2019, non-insulin-dependent, uncontrolled, with long-term complications (peripheral neuropathy).  Last visit 4 months ago.  He has prostatitis. He had cystoscopy.   Reviewed history: He was diagnosed with diabetes in 09/2019 after he presented to the ED with a syncopal episode.  In ED, glucose was 372 and he had glycosuria on urinalysis.  HbA1c was high.  He was started on Metformin.  Reviewed HbA1c levels: Lab Results  Component Value Date   HGBA1C 6.6 (A) 04/01/2020   HGBA1C 6.4 (A) 01/02/2020   HGBA1C 10.0 (H) 10/23/2019   Pt is on a regimen of:  - Metformin ER 750 mg 1x a day >> 1500 mg with dinner >> lightheadedness >> 750 mg 2x a day >> 1500 mg at dinnertime He had palpitations with an instant release Metformin.  Pt checks his sugars twice a day: - am: 125-158, 177, 184 >>  91, 125-155, 185 >> 140-155,  180, 188 - 2h after b'fast: n/c - before lunch: n/c - 2h after lunch: n/c - before dinner: n/c >> 72-155 >> 130-145 >> 94-160s - 2h after dinner: n/c - bedtime: 130, 141-169, 209 >> n/c - nighttime: n/c Lowest sugar was 130 >> 72 >> 91; it is unclear at which level he has hypoglycemia awareness. Highest sugar was 209 >> 209 >> 184 >> 188.  Glucometer: [employer] brand  Pt's meals are better after his dx: - Breakfast: [place] links - Lunch: chicken and broccoli - pm: nuts - Dinner: chisken - Snacks: whole wheat Stopped sweet drinks  -No CKD, last BUN/creatinine:  Lab Results  Component  Value Date   BUN 14 08/02/2020   BUN 14 06/25/2020   CREATININE 0.89 08/02/2020   CREATININE 0.78 06/25/2020   -+ HL; last set of lipids: Lab Results  Component Value Date   CHOL 113 08/02/2020   HDL 28.60 (L) 08/02/2020   LDLCALC 64 08/02/2020   LDLDIRECT 118.0 10/23/2019   TRIG 103.0 08/02/2020   CHOLHDL 4 08/02/2020  On Lipitor 10 mg.  - last eye exam was in 11/2019: No DR. He is on Travaprost for increased pressure.  -+ Numbness and tingling in his feet. He is seen by podiatry. He had ingrown toenail sx. X2. Now recovered.  Pt has FH of DM in elder brother.  He also has a history of alcohol use and transaminitis.  He also has dizziness, which improved, but still present.  He has a history of B12 deficiency: Lab Results  Component Value Date   VITAMINB12 473 08/02/2020   VITAMINB12 389 05/14/2020   VITAMINB12 196 (L) 01/30/2020   We started B12 injections. His shooting pains in his feet improved on B12.  ROS: Constitutional: no weight gain/no weight loss, no fatigue, no subjective hyperthermia, no subjective hypothermia Eyes: no blurry vision, no xerophthalmia ENT: no sore throat, no nodules palpated in neck, no dysphagia, no odynophagia, no hoarseness Cardiovascular: no CP/no SOB/no palpitations/no leg swelling Respiratory: no cough/no SOB/no wheezing Gastrointestinal: no N/no V/no D/no C/no acid reflux Musculoskeletal: no muscle aches/no  joint aches Skin: no rashes, no hair loss Neurological: no tremors/+ numbness/no tingling/+ dizziness  I reviewed pt's medications, allergies, PMH, social hx, family hx, and changes were documented in the history of present illness. Otherwise, unchanged from my initial visit note.  Past Medical History:  Diagnosis Date  . Diabetes mellitus without complication ([place]) 06/6760   pre-diabetes A1C 6.4  . Sleep apnea 2009   cpap  . Substance abuse ([place])    ETOH   Past Surgical History:  Procedure Laterality Date  .  TONSILLECTOMY     age 38   Social History   Socioeconomic History  . Marital status: Unknown    Spouse name: Not on file  . Number of children: Not on file  . Years of education: Not on file  . Highest education level: Not on file  Occupational History  . Not on file  Tobacco Use  . Smoking status: Never Smoker  . Smokeless tobacco: Never Used  Vaping Use  . Vaping Use: Never used  Substance and Sexual Activity  . Alcohol use: Yes    Alcohol/week: 2.0 standard drinks    Types: 1 Glasses of wine, 1 Cans of beer per week    Comment: two drinks nightly occassionally   . Drug use: Never  . Sexual activity: Not on file  Other Topics Concern  . Not on file  Social History Narrative  . Not on file   Social Determinants of Health   Financial Resource Strain:   . Difficulty of Paying Living Expenses: Not on file  Food Insecurity:   . Worried About Charity fundraiser in the Last Year: Not on file  . Ran Out of Food in the Last Year: Not on file  Transportation Needs:   . Lack of Transportation (Medical): Not on file  . Lack of Transportation (Non-Medical): Not on file  Physical Activity:   . Days of Exercise per Week: Not on file  . Minutes of Exercise per Session: Not on file  Stress:   . Feeling of Stress : Not on file  Social Connections:   . Frequency of Communication with Friends and Family: Not on file  . Frequency of Social Gatherings with Friends and Family: Not on file  . Attends Religious Services: Not on file  . Active Member of Clubs or Organizations: Not on file  . Attends Archivist Meetings: Not on file  . Marital Status: Not on file  Intimate Partner Violence:   . Fear of Current or Ex-Partner: Not on file  . Emotionally Abused: Not on file  . Physically Abused: Not on file  . Sexually Abused: Not on file   Current Outpatient Medications on File Prior to Visit  Medication Sig Dispense Refill  . ammonium lactate (AMLACTIN) 12 % cream Apply  topically as needed for dry skin. 385 g 0  . atorvastatin (LIPITOR) 10 MG tablet Take 1 tablet (10 mg total) by mouth daily. (Patient taking differently: Take 10 mg by mouth daily. Takes at night) 90 tablet 3  . betamethasone dipropionate 0.05 % cream Apply 1 application topically as needed.    . Blood Glucose Monitoring Suppl (FREESTYLE FREEDOM LITE) w/Device KIT 1 kit by Does not apply route as needed. 1 kit 0  . finasteride (PROPECIA) 1 MG tablet Take 1 tablet by mouth daily.    Marland Kitchen glucose blood (FREESTYLE LITE) test strip 1 each by Other route as needed for other. Use as instructed 100 each 0  .  Lancets (FREESTYLE) lancets 1 each by Other route as needed for other. Use as instructed 100 each 0  . metFORMIN (GLUCOPHAGE-XR) 750 MG 24 hr tablet Take 1500 mg by mouth with dinner (Patient taking differently: Take 750 mg by mouth. Take 1 tablet with breakfast and 1 tablet with dinner.) 180 tablet 3  . Travoprost, BAK Free, (TRAVATAN) 0.004 % SOLN ophthalmic solution 1 drop at bedtime.    . triamcinolone (KENALOG) 0.025 % ointment Apply 1 application topically 2 (two) times daily. Not for face or private areas. 80 g 0  . UNABLE TO FIND Gets Vitamin B-12 i4/njections  Last injection was on 02/02/20; next one is 03/05/20  Doing for the next 6 months    . Urea 40 % GEL Apply 1 application topically daily. 15 mL 2   No current facility-administered medications on file prior to visit.   Allergies  Allergen Reactions  . Crab [Shellfish Allergy] Hives  . Sulfa Antibiotics Hives  . Sulfur Rash   PMH: -Pertinent PMH reviewed in HPI  PE:  BP 140/90   Pulse (!) 102   Ht _0  (1.905 m)   Wt 266 lb (120.7 kg)   SpO2 98%   BMI 33.25 kg/m  Wt Readings from Last 3 Encounters:  08/05/20 266 lb (120.7 kg)  08/02/20 264 lb 12.8 oz (120.1 kg)  07/15/20 271 lb (122.9 kg)   Constitutional: overweight, in NAD Eyes: PERRLA, EOMI, no exophthalmos ENT: moist mucous membranes, no thyromegaly, no cervical  lymphadenopathy Cardiovascular: Tachycardia, RR, No MRG Respiratory: CTA B Gastrointestinal: abdomen soft, NT, ND, BS+ Musculoskeletal: no deformities, strength intact in all 4 Skin: moist, warm, no rashes Neurological: no tremor with outstretched hands, DTR normal in all 4  ASSESSMENT: 1. DM2, non-insulin-dependent, uncontrolled, with complications - PN  2. HL  3.  Vitamin B12 deficiency  PLAN:  1. Patient with relatively recent diagnosis of diabetes - he found to have an high HbA1c after a syncopal episode in 09/2019.  He had no previous history of prediabetes or diabetes.  He was initially started on Metformin IR, which he could not tolerate due to palpitations.  He was then switched to Metformin ER 750 mg 1x a day >> then increase to 2x a day, which he tolerates well.  We also discussed about improving his diet, which he already started to change after his diabetes diagnosis.  He eliminated sweet drinks and improved the quality of his meals.  He also saw nutrition. -He initially had dizziness with Metformin, which improved with staying well-hydrated and on meclizine.  He was planning to see neurology. -At last visit, sugars were slightly above goal but he was not active at all due to foot surgery.  He was planning to restart walking.  We continued the same regimen.  HbA1c was 6.6%. -At this visit, sugars are still slightly above target.  In the morning, he is not usually lower than 140.  He believes that the high blood sugars may be related to his prostatitis episode, which is definitely possible, but they were slightly above target at last visit, also.  Therefore, for now, I suggested addition of Rybelsus in the morning to cover his postprandial excursions and improve his fasting hyperglycemia.  We will start at a low dose and increase as needed.  He does not have a family history of medullary thyroid cancer or personal history of pancreatitis. - I suggested to:  Patient Instructions   Please continue: - Metformin ER 1500 mg with  dinner  Add: - Rybelsus 3 mg before b'fast  Stop B12 injections and start: - B12 1000 mcg daily  Please return in 4 months with your sugar log.   - we checked his HbA1c: 6.8% (slightly higher) - advised to check sugars at different times of the day - 1x a day, rotating check times - advised for yearly eye exams >> he is UTD - return to clinic in 3-4 months  2. HL -Reviewed latest lipid panel from earlier this month: LDL excellent, improved, HDL low at goal Lab Results  Component Value Date   CHOL 113 08/02/2020   HDL 28.60 (L) 08/02/2020   LDLCALC 64 08/02/2020   LDLDIRECT 118.0 10/23/2019   TRIG 103.0 08/02/2020   CHOLHDL 4 08/02/2020  -He is not on a statin  3.  Vitamin B12 deficiency -We started B12 as his B12 level was found to be low -He is currently on injections and his recent level was improved, and in the normal range -At today's visit, I advised him switch him to p.o. supplementation with 1000 mcg daily.  Philemon Kingdom, MD PhD [facility] Endocrinology

## 2020-08-05 NOTE — Addendum Note (Signed)
Addended by: Cardell Peach I on: 08/05/2020 11:22 AM   Modules accepted: Orders

## 2020-08-05 NOTE — Patient Instructions (Addendum)
Please continue: - Metformin ER 1500 mg with dinner  Add: - Rybelsus 3 mg before b'fast  Stop B12 injections and start: - B12 1000 mcg daily  Please return in 4 months with your sugar log.

## 2020-08-09 DIAGNOSIS — R3121 Asymptomatic microscopic hematuria: Secondary | ICD-10-CM | POA: Diagnosis not present

## 2020-08-09 DIAGNOSIS — R9341 Abnormal radiologic findings on diagnostic imaging of renal pelvis, ureter, or bladder: Secondary | ICD-10-CM | POA: Diagnosis not present

## 2020-08-09 DIAGNOSIS — N2889 Other specified disorders of kidney and ureter: Secondary | ICD-10-CM | POA: Diagnosis not present

## 2020-08-09 HISTORY — PX: KIDNEY SURGERY: SHX687

## 2020-08-19 DIAGNOSIS — G4733 Obstructive sleep apnea (adult) (pediatric): Secondary | ICD-10-CM | POA: Diagnosis not present

## 2020-08-26 DIAGNOSIS — R311 Benign essential microscopic hematuria: Secondary | ICD-10-CM | POA: Diagnosis not present

## 2020-08-26 DIAGNOSIS — R350 Frequency of micturition: Secondary | ICD-10-CM | POA: Diagnosis not present

## 2020-08-26 DIAGNOSIS — N411 Chronic prostatitis: Secondary | ICD-10-CM | POA: Diagnosis not present

## 2020-09-10 ENCOUNTER — Encounter: Payer: Self-pay | Admitting: Neurology

## 2020-09-10 ENCOUNTER — Ambulatory Visit: Payer: BC Managed Care – PPO | Admitting: Neurology

## 2020-09-10 ENCOUNTER — Other Ambulatory Visit: Payer: Self-pay

## 2020-09-10 VITALS — BP 147/93 | HR 101 | Ht 75.0 in | Wt 265.0 lb

## 2020-09-10 DIAGNOSIS — R42 Dizziness and giddiness: Secondary | ICD-10-CM

## 2020-09-10 NOTE — Progress Notes (Signed)
Chief Complaint  Patient presents with  . New Patient (Initial Visit)    patiet is here for evaluation of lightheaded feeling that comes and goes. orthostatics done at PCP office. Pt states its kind of a cloudy feeling. He can feel it right now. Denies room spinning. For about 30 minutes to 1 hour after he wakes up in the  morning is the clearest he feels all day long. He hit his head on the nightstand at the end of November 2020 but had a scan at the hospital and it was fine.   . Referral    Libby Maw, MD  . Room 4    here alone     HISTORICAL  Terry Bonilla is a 49 year old male, seen in request by his primary care physician Dr. Ethelene Hal, [facility], for evaluation of lightheadedness,  I reviewed and summarized the referring note.PMHX HLD DM since Dec 2020 Vit B12 deficiency.  On October 15, 2019, he fell, when he got up from sofa after prolonged sitting, he felt lightheaded, then he managed to the edge of the bed, sat at the edge of the bed, fell forward, hit his head on the nightstand, was treated at emergency room,  I personally reviewed CT head without contrast that was normal,  That was the first obvious lightheadedness episode he ever experienced, since then, he complains of frequent lightheaded sensation, mostly when he gets up from seated position, much improved after sitting down or lying down, it happens almost daily basis, the best time was in the morning few hours, and then he remains symptomatic rest of the day, he denies vertigo,  He does have sedentary lifestyle,  Since March 2021, he had noticed bilateral feet and toes numbness tingling, laboratory evaluation showed vitamin B12 deficiency, level was 196, he received monthly B12 intramuscular shot, which has helped, now he is on B12 p.o. supplement, level was 473  Laboratory evaluations in 2021, A1c was 6.8, B12 of 473, LDL was 64, cholesterol was 113, normal CBC hemoglobin of 16.2, CMP, creatinine  of 0.89, normal B1 10,  Today's blood pressure sitting down 139/91, 96; standing up 161/92, heart rate of 107; standing up 3 minutes 153/92, heart rate of 108, he does complains of mild lightheadedness when standing up  Cardiac monitoring in January 2021 minimum heart rate of 48, maximum heart rate of 139, average heart rate of 79, predominant sinus rhythm, for brief episode of SVT, no high risk findings on monitoring  Echocardiogram January 2021, ejection fraction 60 to 65%, no acute abnormality.  EMG nerve conduction study on June 26, 2020 was normal  REVIEW OF SYSTEMS: Full 14 system review of systems performed and notable only for as above All other review of systems were negative.  ALLERGIES: Allergies  Allergen Reactions  . Crab [Shellfish Allergy] Hives  . Sulfa Antibiotics Hives  . Sulfur Rash    HOME MEDICATIONS: Current Outpatient Medications  Medication Sig Dispense Refill  . alfuzosin (UROXATRAL) 10 MG 24 hr tablet Take 10 mg by mouth at bedtime.    Marland Kitchen atorvastatin (LIPITOR) 10 MG tablet Take 1 tablet (10 mg total) by mouth daily. (Patient taking differently: Take 10 mg by mouth daily. Takes at night) 90 tablet 3  . Blood Glucose Monitoring Suppl (FREESTYLE FREEDOM LITE) w/Device KIT 1 kit by Does not apply route as needed. 1 kit 0  . finasteride (PROPECIA) 1 MG tablet Take 1 tablet by mouth daily.    Marland Kitchen glucose blood (FREESTYLE  LITE) test strip 1 each by Other route as needed for other. Use as instructed 100 each 0  . Lancets (FREESTYLE) lancets 1 each by Other route as needed for other. Use as instructed 100 each 0  . meloxicam (MOBIC) 15 MG tablet Take 15 mg by mouth daily.    . metFORMIN (GLUCOPHAGE-XR) 750 MG 24 hr tablet Take 1500 mg by mouth with dinner (Patient taking differently: Take 750 mg by mouth. Take 1 tablet with breakfast and 1 tablet with dinner.) 180 tablet 3  . Travoprost, BAK Free, (TRAVATAN) 0.004 % SOLN ophthalmic solution 1 drop at bedtime.    .  vitamin B-12 (CYANOCOBALAMIN) 1000 MCG tablet Take 1,000 mcg by mouth daily.    Marland Kitchen ammonium lactate (AMLACTIN) 12 % cream Apply topically as needed for dry skin. (Patient not taking: Reported on 09/10/2020) 385 g 0  . betamethasone dipropionate 0.05 % cream Apply 1 application topically as needed. (Patient not taking: Reported on 09/10/2020)    . Semaglutide (RYBELSUS) 3 MG TABS Take 3 mg before b'fast. (Patient not taking: Reported on 09/10/2020) 90 tablet 3  . triamcinolone (KENALOG) 0.025 % ointment Apply 1 application topically 2 (two) times daily. Not for face or private areas. (Patient not taking: Reported on 09/10/2020) 80 g 0  . Urea 40 % GEL Apply 1 application topically daily. (Patient not taking: Reported on 09/10/2020) 15 mL 2   No current facility-administered medications for this visit.    PAST MEDICAL HISTORY: Past Medical History:  Diagnosis Date  . Diabetes mellitus without complication ([place]) 01/5008   pre-diabetes A1C 6.4  . Sleep apnea 2009   cpap  . Substance abuse ([place])    ETOH    PAST SURGICAL HISTORY: Past Surgical History:  Procedure Laterality Date  . KIDNEY SURGERY Left 08/09/2020   benign cyst laser removal   . TONSILLECTOMY     age 85    FAMILY HISTORY: Family History  Problem Relation Age of Onset  . Ovarian cancer Mother   . Arrhythmia Father   . Diabetes type II Brother   . Colon cancer Neg Hx   . Colon polyps Neg Hx   . Esophageal cancer Neg Hx   . Rectal cancer Neg Hx   . Stomach cancer Neg Hx     SOCIAL HISTORY: Social History   Socioeconomic History  . Marital status: Single    Spouse name: Not on file  . Number of children: Not on file  . Years of education: Not on file  . Highest education level: Not on file  Occupational History  . Not on file  Tobacco Use  . Smoking status: Never Smoker  . Smokeless tobacco: Never Used  Vaping Use  . Vaping Use: Never used  Substance and Sexual Activity  . Alcohol use: Yes     Alcohol/week: 4.0 - 6.0 standard drinks    Types: 4 - 6 Standard drinks or equivalent per week    Comment: two drinks nightly occasionally   . Drug use: Never  . Sexual activity: Not on file  Other Topics Concern  . Not on file  Social History Narrative   Lives alone   Right handed   Caffeine: 1 cup/day   Social Determinants of Health   Financial Resource Strain:   . Difficulty of Paying Living Expenses: Not on file  Food Insecurity:   . Worried About Charity fundraiser in the Last Year: Not on file  . Ran Out of Food in  the Last Year: Not on file  Transportation Needs:   . Lack of Transportation (Medical): Not on file  . Lack of Transportation (Non-Medical): Not on file  Physical Activity:   . Days of Exercise per Week: Not on file  . Minutes of Exercise per Session: Not on file  Stress:   . Feeling of Stress : Not on file  Social Connections:   . Frequency of Communication with Friends and Family: Not on file  . Frequency of Social Gatherings with Friends and Family: Not on file  . Attends Religious Services: Not on file  . Active Member of Clubs or Organizations: Not on file  . Attends Archivist Meetings: Not on file  . Marital Status: Not on file  Intimate Partner Violence:   . Fear of Current or Ex-Partner: Not on file  . Emotionally Abused: Not on file  . Physically Abused: Not on file  . Sexually Abused: Not on file     PHYSICAL EXAM   Vitals:   09/10/20 1033  BP: (!) 147/93  Pulse: (!) 101  Weight: 265 lb (120.2 kg)  Height: _0  (1.905 m)   Not recorded     Body mass index is 33.12 kg/m.  PHYSICAL EXAMNIATION:  Gen: NAD, conversant, well nourised, well groomed  NEUROLOGICAL EXAM:  MENTAL STATUS: Speech/cognition: Awake, alert, oriented to history taking and casual conversation   CRANIAL NERVES: CN II: Visual fields are full to confrontation. Pupils are round equal and briskly reactive to light. CN III, IV, VI: extraocular  movement are normal. No ptosis. CN V: Facial sensation is intact to light touch CN VII: Face is symmetric with normal eye closure  CN VIII: Hearing is normal to causal conversation. CN IX, X: Phonation is normal. CN XI: Head turning and shoulder shrug are intact  MOTOR: There is no pronator drift of out-stretched arms. Muscle bulk and tone are normal. Muscle strength is normal.  REFLEXES: Reflexes are 2+ and symmetric at the biceps, triceps, knees, and ankles. Plantar responses are flexor.  SENSORY: Intact to light touch, pinprick and vibratory sensation are intact in fingers and toes.  COORDINATION: There is no trunk or limb dysmetria noted.  GAIT/STANCE: Posture is normal. Gait is steady with normal steps, base, arm swing, and turning. Heel and toe walking are normal. Tandem gait is normal.  Romberg is absent.   DIAGNOSTIC DATA (LABS, IMAGING, TESTING) - I reviewed patient records, labs, notes, testing and imaging myself where available.   ASSESSMENT AND PLAN  Terry Bonilla is a 49 y.o. male   Frequent lightheaded sensation,  He does have baseline tachycardia, with further increased heart rate with positional change, this might contributed to his complaint of lightheadedness and dizziness,  Likely due to his sedentary lifestyle, deconditioning,  Ultrasound of carotid artery to rule out stenosis  I have suggested him increase water intake, moderate exercise   Marcial Pacas, M.D. Ph.D.  [facility] [address] [place], [place] [address] Ph: [phone] Fax: [phone]  CC:  Libby Maw, [place],  [place] [address]

## 2020-09-17 ENCOUNTER — Other Ambulatory Visit: Payer: Self-pay

## 2020-09-17 ENCOUNTER — Ambulatory Visit (HOSPITAL_COMMUNITY)
Admission: RE | Admit: 2020-09-17 | Discharge: 2020-09-17 | Disposition: A | Discharge status: Home / Self Care | Payer: BC Managed Care – PPO | Source: Ambulatory Visit | Attending: Neurology | Admitting: Neurology

## 2020-09-17 ENCOUNTER — Telehealth: Payer: Self-pay | Admitting: *Deleted

## 2020-09-17 DIAGNOSIS — R42 Dizziness and giddiness: Secondary | ICD-10-CM | POA: Diagnosis not present

## 2020-09-17 NOTE — Telephone Encounter (Signed)
-----   Message from Melvenia Beam, MD sent at 09/17/2020  2:21 PM EDT ----- Dr. Krista Blue is out of the office, your Carotid Dopplers show no significant stenosis. Great news, have a wonderful week! Dr. Jaynee Eagles

## 2020-09-17 NOTE — Progress Notes (Signed)
Carotid duplex has been completed.   Preliminary results in CV Proc.   [name] 09/17/2020 9:09 AM

## 2020-09-17 NOTE — Telephone Encounter (Signed)
I spoke to the patient and he verbalized understanding of his results.

## 2020-09-18 ENCOUNTER — Other Ambulatory Visit: Payer: Self-pay | Admitting: Family Medicine

## 2020-09-18 DIAGNOSIS — G4733 Obstructive sleep apnea (adult) (pediatric): Secondary | ICD-10-CM | POA: Diagnosis not present

## 2020-09-30 DIAGNOSIS — H40023 Open angle with borderline findings, high risk, bilateral: Secondary | ICD-10-CM | POA: Diagnosis not present

## 2020-10-10 ENCOUNTER — Other Ambulatory Visit: Payer: Self-pay | Admitting: Internal Medicine

## 2020-10-28 DIAGNOSIS — G4733 Obstructive sleep apnea (adult) (pediatric): Secondary | ICD-10-CM | POA: Diagnosis not present

## 2020-11-06 ENCOUNTER — Telehealth: Payer: Self-pay | Admitting: Internal Medicine

## 2020-11-06 DIAGNOSIS — L308 Other specified dermatitis: Secondary | ICD-10-CM | POA: Diagnosis not present

## 2020-11-06 NOTE — Telephone Encounter (Signed)
Patient called and wants to schedule a lab appointment for Iron and B12, but there are no orders in for these.  Patient call back number [phone]

## 2020-11-11 ENCOUNTER — Ambulatory Visit: Payer: BC Managed Care – PPO | Admitting: Podiatry

## 2020-11-11 ENCOUNTER — Other Ambulatory Visit: Payer: Self-pay

## 2020-11-11 DIAGNOSIS — L600 Ingrowing nail: Secondary | ICD-10-CM

## 2020-11-11 DIAGNOSIS — M79674 Pain in right toe(s): Secondary | ICD-10-CM

## 2020-11-11 MED ORDER — CEPHALEXIN 500 MG PO CAPS: 500.0000 mg | ORAL_CAPSULE | Freq: Three times a day (TID) | ORAL | 0 refills | Status: DC

## 2020-11-11 NOTE — Patient Instructions (Signed)

## 2020-11-14 NOTE — Progress Notes (Signed)
Subjective: 49 year old male presents the office today for concerns of recurrence of ingrown toenail to the right lateral nail border extremity cause discomfort the last couple of days.  He is noticing redness and swelling on the nail border but denies any drainage or pus or any red streaks.  Also spicule of nail started to come in on the medial aspect but not causing significant pain.  He has no other concerns today. Denies any systemic complaints such as fevers, chills, nausea, vomiting. No acute changes since last appointment, and no other complaints at this time.   Last A1c was 6.8 on August 05, 2020 last blood sugar this morning was 125.  Objective: AAO x3, NAD DP/PT pulses palpable bilaterally, CRT less than 3 seconds Incurvation present to the right lateral hallux nail border with localized edema and erythema likely more from inflammation as opposed to infection.  Is no drainage or pus or ascending cellulitis.  Spicule of nail also has grown which is separated from the medial nail border. MMT 5/5. No pain with calf compression, swelling, warmth, erythema  Assessment: Right hallux ingrown toenail  Plan: -All treatment options discussed with the patient including all alternatives, risks, complications.  -At this time, the patient is requesting partial nail removal with chemical matricectomy to the symptomatic portion of the nail. Risks and complications were discussed with the patient for which they understand and written consent was obtained. Under sterile conditions a total of 3 mL of a mixture of 2% lidocaine plain and 0.5% Marcaine plain was infiltrated in a hallux block fashion. Once anesthetized, the skin was prepped in sterile fashion. A tourniquet was then applied. Next the medial and lateral aspect of hallux nail border was then sharply excised making sure to remove the entire offending nail border. Once the nails were ensured to be removed area was debrided and the underlying skin  was intact. There is no purulence identified in the procedure. Next phenol was then applied under standard conditions and copiously irrigated.  Antibiotic ointment was applied. A dry sterile dressing was applied. After application of the dressing the tourniquet was removed and there is found to be an immediate capillary refill time to the digit. The patient tolerated the procedure well any complications. Post procedure instructions were discussed the patient for which he verbally understood. Follow-up in one week for nail check or sooner if any problems are to arise. Discussed signs/symptoms of infection and directed to call the office immediately should any occur or go directly to the emergency room. In the meantime, encouraged to call the office with any questions, concerns, changes symptoms. -Keflex -Patient encouraged to call the office with any questions, concerns, change in symptoms.

## 2020-11-18 ENCOUNTER — Other Ambulatory Visit: Payer: Self-pay | Admitting: Family Medicine

## 2020-11-18 NOTE — Telephone Encounter (Signed)
Called and advised patient usually iron studies are checked by PCP. Patient verbalized understanding. Patient requested B12 study be done. Lab appointment scheduled for 11/19/2020 at 9:30.

## 2020-11-18 NOTE — Telephone Encounter (Signed)
Please see below.

## 2020-11-18 NOTE — Telephone Encounter (Signed)
She has an appointment coming up with me on 18 January.  I would probably suggest to wait until then.  If he wants to come in sooner for this, I can order a B12 level, but usually iron studies are checked by PCP.

## 2020-11-18 NOTE — Telephone Encounter (Signed)
Patient calling again to follow up on call from 12/15 about his lab orders. Please advise. Ph# [phone]

## 2020-11-19 ENCOUNTER — Other Ambulatory Visit: Payer: BC Managed Care – PPO

## 2020-11-19 ENCOUNTER — Other Ambulatory Visit: Payer: Self-pay

## 2020-11-19 DIAGNOSIS — E538 Deficiency of other specified B group vitamins: Secondary | ICD-10-CM

## 2020-11-19 LAB — VITAMIN B12: Vitamin B-12: 790 pg/mL (ref 211–911)

## 2020-11-21 DIAGNOSIS — N411 Chronic prostatitis: Secondary | ICD-10-CM | POA: Diagnosis not present

## 2020-11-21 DIAGNOSIS — R311 Benign essential microscopic hematuria: Secondary | ICD-10-CM | POA: Diagnosis not present

## 2020-11-25 ENCOUNTER — Ambulatory Visit: Payer: BC Managed Care – PPO | Admitting: Podiatry

## 2020-11-28 DIAGNOSIS — G4733 Obstructive sleep apnea (adult) (pediatric): Secondary | ICD-10-CM | POA: Diagnosis not present

## 2020-12-05 ENCOUNTER — Telehealth: Payer: Self-pay | Admitting: Family Medicine

## 2020-12-05 ENCOUNTER — Other Ambulatory Visit: Payer: Self-pay

## 2020-12-05 MED ORDER — FREESTYLE LITE TEST VI STRP: 1.0000 | ORAL_STRIP | 0 refills | Status: DC | PRN

## 2020-12-05 NOTE — Telephone Encounter (Signed)
Rx sent in

## 2020-12-05 NOTE — Telephone Encounter (Signed)
Pt called and said he needs Freestyle lite test strips sent into his pharmacy [facility] [address], [place] [address]. He said he thought he had refills but they wont fill it

## 2020-12-06 ENCOUNTER — Other Ambulatory Visit: Payer: Self-pay

## 2020-12-10 ENCOUNTER — Ambulatory Visit: Payer: BC Managed Care – PPO | Admitting: Internal Medicine

## 2020-12-10 ENCOUNTER — Other Ambulatory Visit: Payer: Self-pay | Admitting: Family Medicine

## 2020-12-10 DIAGNOSIS — E7800 Pure hypercholesterolemia, unspecified: Secondary | ICD-10-CM

## 2020-12-13 ENCOUNTER — Telehealth: Payer: Self-pay

## 2020-12-13 ENCOUNTER — Other Ambulatory Visit: Payer: Self-pay

## 2020-12-13 ENCOUNTER — Encounter: Payer: Self-pay | Admitting: Internal Medicine

## 2020-12-13 ENCOUNTER — Ambulatory Visit: Payer: BC Managed Care – PPO | Admitting: Internal Medicine

## 2020-12-13 VITALS — BP 128/82 | HR 89 | Ht 75.0 in | Wt 266.0 lb

## 2020-12-13 DIAGNOSIS — E538 Deficiency of other specified B group vitamins: Secondary | ICD-10-CM

## 2020-12-13 DIAGNOSIS — E785 Hyperlipidemia, unspecified: Secondary | ICD-10-CM | POA: Diagnosis not present

## 2020-12-13 DIAGNOSIS — E1140 Type 2 diabetes mellitus with diabetic neuropathy, unspecified: Secondary | ICD-10-CM | POA: Diagnosis not present

## 2020-12-13 LAB — POCT GLYCOSYLATED HEMOGLOBIN (HGB A1C): Hemoglobin A1C: 6.7 % — AB (ref 4.0–5.6)

## 2020-12-13 NOTE — Telephone Encounter (Signed)
Received a PA for the Free Style Lite test strips.  Called pharmacy then the insurance and spoke to [place].  She checked into alternative coverage and they prefer the [employer], [employer], [employer].    Can you prescribe the new meter and test strips to the pharmacy in the chart.     Thanks.  Dm/cma

## 2020-12-13 NOTE — Patient Instructions (Addendum)
Please continue: - Metformin ER 1500 mg with dinner  Also, continue: - B12 1000 mcg daily  Please return in 4 months with your sugar log.

## 2020-12-13 NOTE — Progress Notes (Signed)
Patient ID: Terry Bonilla, male   DOB: 1970-12-18, 50 y.o.   MRN: [id number]   This visit occurred during the SARS-CoV-2 public health emergency.  Safety protocols were in place, including screening questions prior to the visit, additional usage of staff PPE, and extensive cleaning of exam room while observing appropriate contact time as indicated for disinfecting solutions.   HPI: Terry Bonilla is a 50 y.o.-year-old male, referred by his PCP, Dr. Ethelene Hal, for management of DM2, dx on 10/15/2019, non-insulin-dependent, uncontrolled, with long-term complications (peripheral neuropathy).  Last visit 4 months ago.  He had prostatitis and had a cystoscopy in the past.  He had surgery for R kidney cyst in 08/2020.  DM2: Reviewed history: He was diagnosed with diabetes in 09/2019 after he presented to the ED with a syncopal episode.  In ED, glucose was 372 and he had glycosuria on urinalysis.  HbA1c was high.  He was started on Metformin.  Reviewed HbA1c levels: Lab Results  Component Value Date   HGBA1C 6.8 (A) 08/05/2020   HGBA1C 6.6 (A) 04/01/2020   HGBA1C 6.4 (A) 01/02/2020   HGBA1C 10.0 (H) 10/23/2019   Pt is on a regimen of:  - Metformin ER 750 mg 1x a day >> 1500 mg with dinner >> lightheadedness >> 750 mg 2x a day >> 1500 mg at dinnertime We discussed about Rybelsus 3 mg before breakfast at last OV - 07/2020, but not started yet.  He had palpitations with an instant release Metformin.  Pt checks his sugars twice a day: - am: 91, 125-155, 185 >> 140-155,  180, 188 >> 109-148, 163 - 2h after b'fast: n/c - before lunch: n/c - 2h after lunch: n/c - before dinner: 72-155 >> 130-145 >> 94-160s >> 87, 96-137, 151 - 2h after dinner: n/c - bedtime: 130, 141-169, 209 >> n/c - nighttime: n/c Lowest sugar was 130 >> 72 >> 91 >> 87; it is unclear at which level he has hypoglycemia awareness. Highest sugar was 209 >> 184 >> 188 >> 169.  Glucometer: [facility]  Feels improved after his  diagnosis of diabetes: - Breakfast: [place] links - Lunch: chicken and broccoli - pm: nuts - Dinner: chisken - Snacks: whole wheat He stopped sweet drinks.  He drinks 1-2 alcoholic drinks at night.  -No CKD, last BUN/creatinine:  Lab Results  Component Value Date   BUN 14 08/02/2020   BUN 14 06/25/2020   CREATININE 0.89 08/02/2020   CREATININE 0.78 06/25/2020   -+ HL; last set of lipids: Lab Results  Component Value Date   CHOL 113 08/02/2020   HDL 28.60 (L) 08/02/2020   LDLCALC 64 08/02/2020   LDLDIRECT 118.0 10/23/2019   TRIG 103.0 08/02/2020   CHOLHDL 4 08/02/2020  On Lipitor 10.  - last eye exam was in 11/2019: No DR. He is on Travaprost for increased pressure.  -+ Numbness and tingling in his feet. He is seen by podiatry. He had ingrown toenail sx. X2.  Pt has FH of DM in elder brother.  He also has a history of alcohol use and transaminitis.  He also has dizziness, which improved, but still present.  He also has vitamin B12 deficiency:  We started B12 injections.  His shooting pain in his legs improved after starting this.  Reviewed his B12 levels Lab Results  Component Value Date   VITAMINB12 790 11/19/2020   VITAMINB12 473 08/02/2020   VITAMINB12 389 05/14/2020   VITAMINB12 196 (L) 01/30/2020   Allergic to sulfa.  She was on alfuzosin and meloxicam for prostatitis in the past.  He feels that these medications improved his dizziness.  He is now off these medications and dizziness has not recurred.  ROS: Constitutional: no weight gain/no weight loss, no fatigue, no subjective hyperthermia, no subjective hypothermia Eyes: no blurry vision, no xerophthalmia ENT: no sore throat, no nodules palpated in neck, no dysphagia, no odynophagia, no hoarseness Cardiovascular: no CP/no SOB/no palpitations/no leg swelling Respiratory: no cough/no SOB/no wheezing Gastrointestinal: no N/no V/no D/no C/no acid reflux Musculoskeletal: no muscle aches/no joint aches Skin:  no rashes, no hair loss Neurological: no tremors/+ numbness in the tips of his toes/no tingling/no dizziness  I reviewed pt's medications, allergies, PMH, social hx, family hx, and changes were documented in the history of present illness. Otherwise, unchanged from my initial visit note.  Past Medical History:  Diagnosis Date  . Diabetes mellitus without complication ([place]) 97/9480   pre-diabetes A1C 6.4  . Sleep apnea 2009   cpap  . Substance abuse ([place])    ETOH   Past Surgical History:  Procedure Laterality Date  . KIDNEY SURGERY Left 08/09/2020   benign cyst laser removal   . TONSILLECTOMY     age 50   Social History   Socioeconomic History  . Marital status: Single    Spouse name: Not on file  . Number of children: Not on file  . Years of education: Not on file  . Highest education level: Not on file  Occupational History  . Not on file  Tobacco Use  . Smoking status: Never Smoker  . Smokeless tobacco: Never Used  Vaping Use  . Vaping Use: Never used  Substance and Sexual Activity  . Alcohol use: Yes    Alcohol/week: 4.0 - 6.0 standard drinks    Types: 4 - 6 Standard drinks or equivalent per week    Comment: two drinks nightly occasionally   . Drug use: Never  . Sexual activity: Not on file  Other Topics Concern  . Not on file  Social History Narrative   Lives alone   Right handed   Caffeine: 1 cup/day   Social Determinants of Health   Financial Resource Strain: Not on file  Food Insecurity: Not on file  Transportation Needs: Not on file  Physical Activity: Not on file  Stress: Not on file  Social Connections: Not on file  Intimate Partner Violence: Not on file   Current Outpatient Medications on File Prior to Visit  Medication Sig Dispense Refill  . alfuzosin (UROXATRAL) 10 MG 24 hr tablet Take 10 mg by mouth at bedtime.    Marland Kitchen ammonium lactate (AMLACTIN) 12 % cream Apply topically as needed for dry skin. (Patient not taking: Reported on 09/10/2020) 385  g 0  . atorvastatin (LIPITOR) 10 MG tablet Take 1 tablet (10 mg total) by mouth daily. Takes at night 90 tablet 1  . betamethasone dipropionate 0.05 % cream Apply 1 application topically as needed. (Patient not taking: Reported on 09/10/2020)    . Blood Glucose Monitoring Suppl (FREESTYLE FREEDOM LITE) w/Device KIT 1 kit by Does not apply route as needed. 1 kit 0  . cephALEXin (KEFLEX) 500 MG capsule Take 1 capsule (500 mg total) by mouth 3 (three) times daily. 21 capsule 0  . finasteride (PROPECIA) 1 MG tablet Take 1 tablet by mouth daily.    Marland Kitchen glucose blood (FREESTYLE LITE) test strip 1 each by Other route as needed for other. Use as instructed 100 each 0  .  Lancets (FREESTYLE) lancets USE AS DIRECTED AS NEEDED 100 each 0  . meloxicam (MOBIC) 15 MG tablet Take 15 mg by mouth daily.    . meloxicam (MOBIC) 15 MG tablet Take 15 mg by mouth daily.    . metFORMIN (GLUCOPHAGE-XR) 750 MG 24 hr tablet TAKE 2 TABLETS BY MOUTH WITH DINNER EVERY DAY 180 tablet 3  . Semaglutide (RYBELSUS) 3 MG TABS Take 3 mg before b'fast. (Patient not taking: Reported on 09/10/2020) 90 tablet 3  . Travoprost, BAK Free, (TRAVATAN) 0.004 % SOLN ophthalmic solution 1 drop at bedtime.    . triamcinolone (KENALOG) 0.025 % ointment Apply 1 application topically 2 (two) times daily. Not for face or private areas. (Patient not taking: Reported on 09/10/2020) 80 g 0  . Urea 40 % GEL Apply 1 application topically daily. (Patient not taking: Reported on 09/10/2020) 15 mL 2  . vitamin B-12 (CYANOCOBALAMIN) 1000 MCG tablet Take 1,000 mcg by mouth daily.     No current facility-administered medications on file prior to visit.   Allergies  Allergen Reactions  . Crab [Shellfish Allergy] Hives  . Elemental Sulfur Rash  . Sulfa Antibiotics Hives   PMH: -Pertinent PMH reviewed in HPI  PE: BP 128/82   Pulse 89   Ht '6\' 3"'  (1.905 m)   Wt 266 lb (120.7 kg)   SpO2 99%   BMI 33.25 kg/m  Wt Readings from Last 3 Encounters:   12/13/20 266 lb (120.7 kg)  09/10/20 265 lb (120.2 kg)  08/05/20 266 lb (120.7 kg)   Constitutional: overweight, in NAD Eyes: PERRLA, EOMI, no exophthalmos ENT: moist mucous membranes, no thyromegaly, no cervical lymphadenopathy Cardiovascular: RRR, No MRG Respiratory: CTA B Gastrointestinal: abdomen soft, NT, ND, BS+ Musculoskeletal: no deformities, strength intact in all 4 Skin: moist, warm, no rashes Neurological: no tremor with outstretched hands, DTR normal in all 4  ASSESSMENT: 1. DM2, non-insulin-dependent, uncontrolled, with complications - PN  He does not have a family history of medullary thyroid cancer or personal history of pancreatitis.  2. HL  3.  Vitamin B12 deficiency  PLAN:  1. Patient with a relatively recent diagnosis of diabetes.  He was found to have a high HbA1c after a syncopal episode in 09/2019.  No previous history of prediabetes or diabetes.  He was initially started on metformin ER, which she could not tolerate due to palpitations.  He was then switched to metformin ER 750 mg daily, then increase to twice a day.  He tolerates this well.  He also saw nutrition and started to change his diet after his diagnosis of diabetes.  He stopped sweet drinks. -At last visit, sugars were still slightly above target in the morning, usually higher than 140.  I advised him to add Rybelsus at low-dose and advised him to increase the dose as needed and as tolerated.  Of note, his HbA1c at last visit was still at goal, 6.8%, but slightly higher than before. -At today's visit, he tells me that he did not start Rybelsus but wanted to see how his sugars would do after moving the entire metformin dose at night.  They have improved, so he ended up not starting the GLP-1 receptor agonist.  In the morning, most of his blood sugars are at goal with occasional blood sugars in the 130s and 140s, but overall much improved.  He tells me that his sugars are higher when he drinks alcohol at  night we discussed about trying to limit this to at most  1 drink at night.  I do not feel we absolutely need to Rybelsus at this visit. -He inquires about the [employer].  A friend of his tried it and lost a significant amount of weight.  I advised him to only do it for a few months if he started, but this is not a long-term diet - I suggested to:  Patient Instructions  Please continue: - Metformin ER 1500 mg with dinner - B12 1000 mcg daily  Please return in 4 months with your sugar log.   - we checked his HbA1c: 6.7% (slightly lower) - advised to check sugars at different times of the day - 1x a day, rotating check times - advised for yearly eye exams >> he is UTD but needs any appointments - return to clinic in 4 months  2. HL -Reviewed latest lipid panel from 07/2020: Fractions at goal (LDL much improved) except for a slightly low HDL: Lab Results  Component Value Date   CHOL 113 08/02/2020   HDL 28.60 (L) 08/02/2020   LDLCALC 64 08/02/2020   LDLDIRECT 118.0 10/23/2019   TRIG 103.0 08/02/2020   CHOLHDL 4 08/02/2020  -On Lipitor 10 mg daily, which she tolerates well  3.  Vitamin B12 deficiency -We started B12 injections after B12 level was very low -He was on injections at last visit but we switched to p.o. B12 1000 mcg daily -Vitamin B12 level improved significantly at last check, at the end of last month -Also, his tingling improved but still has tingling on the tips of his fingers -We will continue oral B12 for now.  We will recheck his B12 level at next visit.  Philemon Kingdom, MD PhD [facility]

## 2020-12-15 ENCOUNTER — Other Ambulatory Visit: Payer: Self-pay | Admitting: Family

## 2020-12-18 NOTE — Telephone Encounter (Signed)
Called [facility], spoke to Tas,  and they don't carry the [employer] meters.  Gave a verbal order for the [employer] Next meter and strips.  Test strips will costs $51 for qty 50 not sure of the cost of the meter. Did advise that he could try getting a meter from the pharmacy [facility] which has meters and test strips without an RX. He will check on that and options with [facility].  Dm/cma

## 2020-12-29 DIAGNOSIS — G4733 Obstructive sleep apnea (adult) (pediatric): Secondary | ICD-10-CM | POA: Diagnosis not present

## 2020-12-30 LAB — HM DIABETES EYE EXAM

## 2021-01-26 DIAGNOSIS — G4733 Obstructive sleep apnea (adult) (pediatric): Secondary | ICD-10-CM | POA: Diagnosis not present

## 2021-01-31 ENCOUNTER — Ambulatory Visit: Payer: BC Managed Care – PPO | Admitting: Family Medicine

## 2021-01-31 ENCOUNTER — Other Ambulatory Visit: Payer: Self-pay

## 2021-01-31 ENCOUNTER — Encounter: Payer: Self-pay | Admitting: Family Medicine

## 2021-01-31 VITALS — BP 140/88 | HR 93 | Temp 97.0°F | Ht 75.0 in | Wt 266.6 lb

## 2021-01-31 DIAGNOSIS — E7800 Pure hypercholesterolemia, unspecified: Secondary | ICD-10-CM

## 2021-01-31 DIAGNOSIS — R030 Elevated blood-pressure reading, without diagnosis of hypertension: Secondary | ICD-10-CM | POA: Diagnosis not present

## 2021-01-31 DIAGNOSIS — E669 Obesity, unspecified: Secondary | ICD-10-CM

## 2021-01-31 DIAGNOSIS — E1140 Type 2 diabetes mellitus with diabetic neuropathy, unspecified: Secondary | ICD-10-CM

## 2021-01-31 DIAGNOSIS — E538 Deficiency of other specified B group vitamins: Secondary | ICD-10-CM

## 2021-01-31 DIAGNOSIS — G629 Polyneuropathy, unspecified: Secondary | ICD-10-CM

## 2021-01-31 LAB — COMPREHENSIVE METABOLIC PANEL
ALT: 53 U/L (ref 0–53)
AST: 29 U/L (ref 0–37)
Albumin: 4.4 g/dL (ref 3.5–5.2)
Alkaline Phosphatase: 98 U/L (ref 39–117)
BUN: 13 mg/dL (ref 6–23)
CO2: 29 mEq/L (ref 19–32)
Calcium: 9.3 mg/dL (ref 8.4–10.5)
Chloride: 101 mEq/L (ref 96–112)
Creatinine, Ser: 0.84 mg/dL (ref 0.40–1.50)
GFR: 100.72 mL/min (ref >60.00)
Glucose, Bld: 151 mg/dL — ABNORMAL HIGH (ref 70–99)
Potassium: 4.1 mEq/L (ref 3.5–5.1)
Sodium: 137 mEq/L (ref 135–145)
Total Bilirubin: 1.3 mg/dL — ABNORMAL HIGH (ref 0.2–1.2)
Total Protein: 6.7 g/dL (ref 6.0–8.3)

## 2021-01-31 LAB — URINALYSIS, ROUTINE W REFLEX MICROSCOPIC
Bilirubin Urine: NEGATIVE
Ketones, ur: NEGATIVE
Leukocytes,Ua: NEGATIVE
Nitrite: NEGATIVE
Specific Gravity, Urine: >=1.03 — AB (ref 1.000–1.030)
Total Protein, Urine: NEGATIVE
Urine Glucose: NEGATIVE
Urobilinogen, UA: 0.2 (ref 0.0–1.0)
pH: 5.5 (ref 5.0–8.0)

## 2021-01-31 LAB — LIPID PANEL
Cholesterol: 111 mg/dL (ref 0–200)
HDL: 26.5 mg/dL — ABNORMAL LOW (ref >39.00)
LDL Cholesterol: 53 mg/dL (ref 0–99)
NonHDL: 84.09
Total CHOL/HDL Ratio: 4
Triglycerides: 157 mg/dL — ABNORMAL HIGH (ref 0.0–149.0)
VLDL: 31.4 mg/dL (ref 0.0–40.0)

## 2021-01-31 LAB — CBC
HCT: 45.9 % (ref 39.0–52.0)
Hemoglobin: 16.3 g/dL (ref 13.0–17.0)
MCHC: 35.4 g/dL (ref 30.0–36.0)
MCV: 87.4 fl (ref 78.0–100.0)
Platelets: 162 K/uL (ref 150.0–400.0)
RBC: 5.25 Mil/uL (ref 4.22–5.81)
RDW: 13.5 % (ref 11.5–15.5)
WBC: 5.1 K/uL (ref 4.0–10.5)

## 2021-01-31 LAB — MICROALBUMIN / CREATININE URINE RATIO
Creatinine,U: 232 mg/dL
Microalb Creat Ratio: 1 mg/g (ref 0.0–30.0)
Microalb, Ur: 2.3 mg/dL — ABNORMAL HIGH (ref 0.0–1.9)

## 2021-01-31 MED ORDER — LISINOPRIL 10 MG PO TABS: 10.0000 mg | ORAL_TABLET | Freq: Every day | ORAL | 3 refills | Status: DC

## 2021-01-31 NOTE — Patient Instructions (Signed)
BMI for Adults What is BMI? Body mass index (BMI) is a number that is calculated from a person's weight and height. BMI can help estimate how much of a person's weight is composed of fat. BMI does not measure body fat directly. Rather, it is an alternative to procedures that directly measure body fat, which can be difficult and expensive. BMI can help identify people who may be at higher risk for certain medical problems. What are BMI measurements used for? BMI is used as a screening tool to identify possible weight problems. It helps determine whether a person is obese, overweight, a healthy weight, or underweight. BMI is useful for:  Identifying a weight problem that may be related to a medical condition or may increase the risk for medical problems.  Promoting changes, such as changes in diet and exercise, to help reach a healthy weight. BMI screening can be repeated to see if these changes are working. How is BMI calculated? BMI involves measuring your weight in relation to your height. Both height and weight are measured, and the BMI is calculated from those numbers. This can be done either in [place] ([place] or metric measurements. Note that charts and online BMI calculators are available to help you find your BMI quickly and easily without having to do these calculations yourself. To calculate your BMI in [place] ([place] measurements: 1. Measure your weight in pounds (lb). 2. Multiply the number of pounds by 703. ? For example, for a person who weighs 180 lb, multiply that number by 703, which equals 126,540. 3. Measure your height in inches. Then multiply that number by itself to get a measurement called "inches squared." ? For example, for a person who is 70 inches tall, the "inches squared" measurement is 70 inches x 70 inches, which equals 4,900 inches squared. 4. Divide the total from step 2 (number of lb x 703) by the total from step 3 (inches squared): 126,540  4,900 = 25.8. This is  your BMI.   To calculate your BMI in metric measurements: 1. Measure your weight in kilograms (kg). 2. Measure your height in meters (m). Then multiply that number by itself to get a measurement called "meters squared." ? For example, for a person who is 1.75 m tall, the "meters squared" measurement is 1.75 m x 1.75 m, which is equal to 3.1 meters squared. 3. Divide the number of kilograms (your weight) by the meters squared number. In this example: 70  3.1 = 22.6. This is your BMI. What do the results mean? BMI charts are used to identify whether you are underweight, normal weight, overweight, or obese. The following guidelines will be used:  Underweight: BMI less than 18.5.  Normal weight: BMI between 18.5 and 24.9.  Overweight: BMI between 25 and 29.9.  Obese: BMI of 30 or above. Keep these notes in mind:  Weight includes both fat and muscle, so someone with a muscular build, such as an athlete, may have a BMI that is higher than 24.9. In cases like these, BMI is not an accurate measure of body fat.  To determine if excess body fat is the cause of a BMI of 25 or higher, further assessments may need to be done by a health care provider.  BMI is usually interpreted in the same way for men and women. Where to find more information For more information about BMI, including tools to quickly calculate your BMI, go to these websites:  Centers for Disease Control and Prevention: http://www.wolf.info/  American Heart Association: www.heart.org  National Heart, Lung, and Blood Institute: https://wilson-eaton.com/ Summary  Body mass index (BMI) is a number that is calculated from a person's weight and height.  BMI may help estimate how much of a person's weight is composed of fat. BMI can help identify those who may be at higher risk for certain medical problems.  BMI can be measured using English measurements or metric measurements.  BMI charts are used to identify whether you are underweight, normal  weight, overweight, or obese. This information is not intended to replace advice given to you by your health care provider. Make sure you discuss any questions you have with your health care provider. Document Revised: 08/02/2019 Document Reviewed: 06/09/2019 Elsevier Patient Education  2021 [place] [employer] Calories are units of energy. Your body needs a certain number of calories from food to keep going throughout the day. When you eat or drink more calories than your body needs, your body stores the extra calories mostly as fat. When you eat or drink fewer calories than your body needs, your body burns fat to get the energy it needs. Calorie counting means keeping track of how many calories you eat and drink each day. Calorie counting can be helpful if you need to lose weight. If you eat fewer calories than your body needs, you should lose weight. Ask your health care provider what a healthy weight is for you. For calorie counting to work, you will need to eat the right number of calories each day to lose a healthy amount of weight per week. A dietitian can help you figure out how many calories you need in a day and will suggest ways to reach your calorie goal.  A healthy amount of weight to lose each week is usually 1-2 lb (0.5-0.9 kg). This usually means that your daily calorie intake should be reduced by 500-750 calories.  Eating 1,200-1,500 calories a day can help most women lose weight.  Eating 1,500-1,800 calories a day can help most men lose weight. What do I need to know about calorie counting? Work with your health care provider or dietitian to determine how many calories you should get each day. To meet your daily calorie goal, you will need to:  Find out how many calories are in each food that you would like to eat. Try to do this before you eat.  Decide how much of the food you plan to eat.  Keep a food log. Do this by writing down what you ate and  how many calories it had. To successfully lose weight, it is important to balance calorie counting with a healthy lifestyle that includes regular activity. Where do I find calorie information? The number of calories in a food can be found on a Nutrition Facts label. If a food does not have a Nutrition Facts label, try to look up the calories online or ask your dietitian for help. Remember that calories are listed per serving. If you choose to have more than one serving of a food, you will have to multiply the calories per serving by the number of servings you plan to eat. For example, the label on a package of bread might say that a serving size is 1 slice and that there are 90 calories in a serving. If you eat 1 slice, you will have eaten 90 calories. If you eat 2 slices, you will have eaten 180 calories.   How do I keep a  food log? After each time that you eat, record the following in your food log as soon as possible:  What you ate. Be sure to include toppings, sauces, and other extras on the food.  How much you ate. This can be measured in cups, ounces, or number of items.  How many calories were in each food and drink.  The total number of calories in the food you ate. Keep your food log near you, such as in a pocket-sized notebook or on an app or website on your mobile phone. Some programs will calculate calories for you and show you how many calories you have left to meet your daily goal. What are some portion-control tips?  Know how many calories are in a serving. This will help you know how many servings you can have of a certain food.  Use a measuring cup to measure serving sizes. You could also try weighing out portions on a kitchen scale. With time, you will be able to estimate serving sizes for some foods.  Take time to put servings of different foods on your favorite plates or in your favorite bowls and cups so you know what a serving looks like.  Try not to eat straight from a  food's packaging, such as from a bag or box. Eating straight from the package makes it hard to see how much you are eating and can lead to overeating. Put the amount you would like to eat in a cup or on a plate to make sure you are eating the right portion.  Use smaller plates, glasses, and bowls for smaller portions and to prevent overeating.  Try not to multitask. For example, avoid watching TV or using your computer while eating. If it is time to eat, sit down at a table and enjoy your food. This will help you recognize when you are full. It will also help you be more mindful of what and how much you are eating. What are tips for following this plan? Reading food labels  Check the calorie count compared with the serving size. The serving size may be smaller than what you are used to eating.  Check the source of the calories. Try to choose foods that are high in protein, fiber, and vitamins, and low in saturated fat, trans fat, and sodium. Shopping  Read nutrition labels while you shop. This will help you make healthy decisions about which foods to buy.  Pay attention to nutrition labels for low-fat or fat-free foods. These foods sometimes have the same number of calories or more calories than the full-fat versions. They also often have added sugar, starch, or salt to make up for flavor that was removed with the fat.  Make a grocery list of lower-calorie foods and stick to it. Cooking  Try to cook your favorite foods in a healthier way. For example, try baking instead of frying.  Use low-fat dairy products. Meal planning  Use more fruits and vegetables. One-half of your plate should be fruits and vegetables.  Include lean proteins, such as chicken, [place], and fish. Lifestyle Each week, aim to do one of the following:  150 minutes of moderate exercise, such as walking.  75 minutes of vigorous exercise, such as running. General information  Know how many calories are in the foods  you eat most often. This will help you calculate calorie counts faster.  Find a way of tracking calories that works for you. Get creative. Try different apps or programs if writing  down calories does not work for you. What foods should I eat?  Eat nutritious foods. It is better to have a nutritious, high-calorie food, such as an avocado, than a food with few nutrients, such as a bag of potato chips.  Use your calories on foods and drinks that will fill you up and will not leave you hungry soon after eating. ? Examples of foods that fill you up are nuts and nut butters, vegetables, lean proteins, and high-fiber foods such as whole grains. High-fiber foods are foods with more than 5 g of fiber per serving.  Pay attention to calories in drinks. Low-calorie drinks include water and unsweetened drinks. The items listed above may not be a complete list of foods and beverages you can eat. Contact a dietitian for more information.   What foods should I limit? Limit foods or drinks that are not good sources of vitamins, minerals, or protein or that are high in unhealthy fats. These include:  Candy.  Other sweets.  Sodas, specialty coffee drinks, alcohol, and juice. The items listed above may not be a complete list of foods and beverages you should avoid. Contact a dietitian for more information. How do I count calories when eating out?  Pay attention to portions. Often, portions are much larger when eating out. Try these tips to keep portions smaller: ? Consider sharing a meal instead of getting your own. ? If you get your own meal, eat only half of it. Before you start eating, ask for a container and put half of your meal into it. ? When available, consider ordering smaller portions from the menu instead of full portions.  Pay attention to your food and drink choices. Knowing the way food is cooked and what is included with the meal can help you eat fewer calories. ? If calories are listed on  the menu, choose the lower-calorie options. ? Choose dishes that include vegetables, fruits, whole grains, low-fat dairy products, and lean proteins. ? Choose items that are boiled, broiled, grilled, or steamed. Avoid items that are buttered, battered, fried, or served with cream sauce. Items labeled as crispy are usually fried, unless stated otherwise. ? Choose water, low-fat milk, unsweetened iced tea, or other drinks without added sugar. If you want an alcoholic beverage, choose a lower-calorie option, such as a glass of wine or light beer. ? Ask for dressings, sauces, and syrups on the side. These are usually high in calories, so you should limit the amount you eat. ? If you want a salad, choose a garden salad and ask for grilled meats. Avoid extra toppings such as bacon, cheese, or fried items. Ask for the dressing on the side, or ask for olive oil and vinegar or lemon to use as dressing.  Estimate how many servings of a food you are given. Knowing serving sizes will help you be aware of how much food you are eating at restaurants. Where to find more information  Centers for Disease Control and Prevention: http://www.wolf.info/  U.S. Department of Agriculture: http://www.wilson-mendoza.org/ Summary  Calorie counting means keeping track of how many calories you eat and drink each day. If you eat fewer calories than your body needs, you should lose weight.  A healthy amount of weight to lose per week is usually 1-2 lb (0.5-0.9 kg). This usually means reducing your daily calorie intake by 500-750 calories.  The number of calories in a food can be found on a Nutrition Facts label. If a food does not have a  Nutrition Facts label, try to look up the calories online or ask your dietitian for help.  Use smaller plates, glasses, and bowls for smaller portions and to prevent overeating.  Use your calories on foods and drinks that will fill you up and not leave you hungry shortly after a meal. This information is not intended  to replace advice given to you by your health care provider. Make sure you discuss any questions you have with your health care provider. Document Revised: 12/21/2019 Document Reviewed: 12/21/2019 Elsevier Patient Education  2021 [place].  Exercising to Lose Weight Exercise is structured, repetitive physical activity to improve fitness and health. Getting regular exercise is important for everyone. It is especially important if you are overweight. Being overweight increases your risk of heart disease, stroke, diabetes, high blood pressure, and several types of cancer. Reducing your calorie intake and exercising can help you lose weight. Exercise is usually categorized as moderate or vigorous intensity. To lose weight, most people need to do a certain amount of moderate-intensity or vigorous-intensity exercise each week. Moderate-intensity exercise Moderate-intensity exercise is any activity that gets you moving enough to burn at least three times more energy (calories) than if you were sitting. Examples of moderate exercise include:  Walking a mile in 15 minutes.  Doing light yard work.  Biking at an easy pace. Most people should get at least 150 minutes (2 hours and 30 minutes) a week of moderate-intensity exercise to maintain their body weight.   Vigorous-intensity exercise Vigorous-intensity exercise is any activity that gets you moving enough to burn at least six times more calories than if you were sitting. When you exercise at this intensity, you should be working hard enough that you are not able to carry on a conversation. Examples of vigorous exercise include:  Running.  Playing a team sport, such as football, basketball, and soccer.  Jumping rope. Most people should get at least 75 minutes (1 hour and 15 minutes) a week of vigorous-intensity exercise to maintain their body weight. How can exercise affect me? When you exercise enough to burn more calories than you eat, you  lose weight. Exercise also reduces body fat and builds muscle. The more muscle you have, the more calories you burn. Exercise also:  Improves mood.  Reduces stress and tension.  Improves your overall fitness, flexibility, and endurance.  Increases bone strength. The amount of exercise you need to lose weight depends on:  Your age.  The type of exercise.  Any health conditions you have.  Your overall physical ability. Talk to your health care provider about how much exercise you need and what types of activities are safe for you. What actions can I take to lose weight? Nutrition  Make changes to your diet as told by your health care provider or diet and nutrition specialist (dietitian). This may include: ? Eating fewer calories. ? Eating more protein. ? Eating less unhealthy fats. ? Eating a diet that includes fresh fruits and vegetables, whole grains, low-fat dairy products, and lean protein. ? Avoiding foods with added fat, salt, and sugar.  Drink plenty of water while you exercise to prevent dehydration or heat stroke.   Activity  Choose an activity that you enjoy and set realistic goals. Your health care provider can help you make an exercise plan that works for you.  Exercise at a moderate or vigorous intensity most days of the week. ? The intensity of exercise may vary from person to person. You can tell how  intense a workout is for you by paying attention to your breathing and heartbeat. Most people will notice their breathing and heartbeat get faster with more intense exercise.  Do resistance training twice each week, such as: ? Push-ups. ? Sit-ups. ? Lifting weights. ? Using resistance bands.  Getting short amounts of exercise can be just as helpful as long structured periods of exercise. If you have trouble finding time to exercise, try to include exercise in your daily routine. ? Get up, stretch, and walk around every 30 minutes throughout the day. ? Go for a  walk during your lunch break. ? Park your car farther away from your destination. ? If you take public transportation, get off one stop early and walk the rest of the way. ? Make phone calls while standing up and walking around. ? Take the stairs instead of elevators or escalators.  Wear comfortable clothes and shoes with good support.  Do not exercise so much that you hurt yourself, feel dizzy, or get very short of breath. Where to find more information  U.S. Department of Health and Human Services: [employer].at  Centers for Disease Control and Prevention (CDC): http://www.wolf.info/ Contact a health care provider:  Before starting a new exercise program.  If you have questions or concerns about your weight.  If you have a medical problem that keeps you from exercising. Get help right away if you have any of the following while exercising:  Injury.  Dizziness.  Difficulty breathing or shortness of breath that does not go away when you stop exercising.  Chest pain.  Rapid heartbeat. Summary  Being overweight increases your risk of heart disease, stroke, diabetes, high blood pressure, and several types of cancer.  Losing weight happens when you burn more calories than you eat.  Reducing the amount of calories you eat in addition to getting regular moderate or vigorous exercise each week helps you lose weight. This information is not intended to replace advice given to you by your health care provider. Make sure you discuss any questions you have with your health care provider. Document Revised: 03/07/2020 Document Reviewed: 03/07/2020 Elsevier Patient Education  2021 [place]. Lisinopril Tablets What is this medicine? LISINOPRIL (lyse [place] oh pril) is an ACE inhibitor. It treats high blood pressure and heart failure. It can treat heart damage after a heart attack. This medicine may be used for other purposes; ask your health care provider or pharmacist if you have  questions. COMMON BRAND NAME(S): Prinivil, Zestril What should I tell my health care provider before I take this medicine? They need to know if you have any of these conditions:  diabetes  heart or blood vessel disease  kidney disease  low blood pressure  previous swelling of the tongue, face, or lips with difficulty breathing, difficulty swallowing, hoarseness, or tightening of the throat  an unusual or allergic reaction to lisinopril, other ACE inhibitors, insect venom, foods, dyes, or preservatives  pregnant or trying to get pregnant  breast-feeding How should I use this medicine? Take this medicine by mouth. Take it as directed on the prescription label at the same time every day. You can take it with or without food. If it upsets your stomach, take it with food. Keep taking it unless your health care provider tells you to stop. Talk to your health care provider about the use of this medicine in children. While it may be prescribed for children as young as 6 for selected conditions, precautions do apply. Overdosage: If you  think you have taken too much of this medicine contact a poison control center or emergency room at once. NOTE: This medicine is only for you. Do not share this medicine with others. What if I miss a dose? If you miss a dose, take it as soon as you can. If it is almost time for your next dose, take only that dose. Do not take double or extra doses. What may interact with this medicine? Do not take this medicine with any of the following medications:  hymenoptera venom  sacubitril; valsartan This medicines may also interact with the following medications:  aliskiren  angiotensin receptor blockers, like losartan or valsartan  certain medicines for diabetes  diuretics  everolimus  gold compounds  lithium  NSAIDs, medicines for pain and inflammation, like ibuprofen or naproxen  potassium salts or supplements  salt  substitutes  sirolimus  temsirolimus This list may not describe all possible interactions. Give your health care provider a list of all the medicines, herbs, non-prescription drugs, or dietary supplements you use. Also tell them if you smoke, drink alcohol, or use illegal drugs. Some items may interact with your medicine. What should I watch for while using this medicine? Visit your health care provider for regular check ups. Check your blood pressure as directed. Ask your health care provider what your blood pressure should be. Also, find out when you should contact him or her. Do not treat yourself for coughs, colds, or pain while you are using this medicine without asking your health care provider for advice. Some medicines may increase your blood pressure. Inform your health care provider if you wish to become pregnant or think you might be pregnant. There is a potential for serious side effects to an unborn child. Talk to your health care provider for more information. You may get drowsy or dizzy. Do not drive, use machinery, or do anything that needs mental alertness until you know how this medicine affects you. Do not stand or sit up quickly, especially if you are an older patient. This reduces the risk of dizzy or fainting spells. Alcohol can make you more drowsy and dizzy. Avoid alcoholic drinks. Avoid salt substitutes unless you are told otherwise by your health care provider. What side effects may I notice from receiving this medicine? Side effects that you should report to your doctor or health care professional as soon as possible:  allergic reactions (skin rash, itching or hives, swelling of the hands, feet, face, lips, throat, or tongue)  breathing problems  high potassium levels (chest pain; or fast, irregular heartbeat; muscle weakness)  kidney injury (trouble passing urine or change in the amount of urine)  liver injury (dark yellow or brown urine; general ill feeling or  flu-like symptoms; light-colored stools; loss of appetite; right upper belly pain; unusually weak or tired; yellowing of the eyes or skin)  low blood pressure (dizziness; feeling faint or lightheaded, falls; unusually weak or tired) Side effects that usually do not require medical attention (report to your doctor or health care professional if they continue or are bothersome):  changes in taste  cough  dizziness  headache This list may not describe all possible side effects. Call your doctor for medical advice about side effects. You may report side effects to FDA at 1-800-FDA-1088. Where should I keep my medicine? Keep out of the reach of children and pets. Store at room temperature between 20 and 25 degrees C (68 and 77 degrees F). Protect from moisture. Keep the container  tightly closed. Do not freeze. Avoid exposure to extreme heat. Get rid of any unused medicine after the expiration date. To get rid of medicines that are no longer needed or have expired:  Take the medicine to a medicine take-back program. Check with your pharmacy or law enforcement to find a location.  If you cannot return the medicine, check the label or package insert to see if the medicine should be thrown out in the garbage or flushed down the toilet. If you are not sure, ask your health care provider. If it is safe to put in the trash, empty the medicine out of the container. Mix the medicine with cat litter, dirt, coffee grounds, or other unwanted substance. Seal the mixture in a bag or container. Put it in the trash. NOTE: This sheet is a summary. It may not cover all possible information. If you have questions about this medicine, talk to your doctor, pharmacist, or health care provider.  2021 Elsevier/Gold Standard (2020-10-05 14:57:12)

## 2021-01-31 NOTE — Progress Notes (Signed)
Established Patient Office Visit  Subjective:  Patient ID: Terry Bonilla, male    DOB: 1971/07/16  Age: 50 y.o. MRN: [id number]  CC:  Chief Complaint  Patient presents with  . Follow-up    Pt is here for 6 mo follow, DM for medications, pt is fasting     HPI [name] presents for follow-up of B12 deficiency, elevated ldl cholesterol, obesity, neuropathy and elevated blood pressure.  B12 measured in January was up to 790 however he is still experiencing some mild paresthesias in both of his knees.  He continues with B12 supplementation.  LDL cholesterol has been controlled well with his statin.  Blood pressure is elevated today.  It has been running in the upper 998P to 382N systolically when he checks it at home.  Past Medical History:  Diagnosis Date  . Diabetes mellitus without complication ([place]) 03/3975   pre-diabetes A1C 6.4  . Sleep apnea 2009   cpap  . Substance abuse ([facility])    ETOH    Past Surgical History:  Procedure Laterality Date  . KIDNEY SURGERY Left 08/09/2020   benign cyst laser removal   . TONSILLECTOMY     age 41    Family History  Problem Relation Age of Onset  . Ovarian cancer Mother   . Arrhythmia Father   . Diabetes type II Brother   . Colon cancer Neg Hx   . Colon polyps Neg Hx   . Esophageal cancer Neg Hx   . Rectal cancer Neg Hx   . Stomach cancer Neg Hx     Social History   Socioeconomic History  . Marital status: Single    Spouse name: Not on file  . Number of children: Not on file  . Years of education: Not on file  . Highest education level: Not on file  Occupational History  . Not on file  Tobacco Use  . Smoking status: Never Smoker  . Smokeless tobacco: Never Used  Vaping Use  . Vaping Use: Never used  Substance and Sexual Activity  . Alcohol use: Yes    Alcohol/week: 4.0 - 6.0 standard drinks    Types: 4 - 6 Standard drinks or equivalent per week    Comment: two drinks nightly occasionally   . Drug use: Never  .  Sexual activity: Not on file  Other Topics Concern  . Not on file  Social History Narrative   Lives alone   Right handed   Caffeine: 1 cup/day   Social Determinants of Health   Financial Resource Strain: Not on file  Food Insecurity: Not on file  Transportation Needs: Not on file  Physical Activity: Not on file  Stress: Not on file  Social Connections: Not on file  Intimate Partner Violence: Not on file    Outpatient Medications Prior to Visit  Medication Sig Dispense Refill  . ammonium lactate (AMLACTIN) 12 % cream Apply topically as needed for dry skin. 385 g 0  . atorvastatin (LIPITOR) 10 MG tablet Take 1 tablet (10 mg total) by mouth daily. Takes at night 90 tablet 1  . Blood Glucose Calibration (ASCENSIA CONTOUR VI) by In Vitro route.    . Blood Glucose Monitoring Suppl (FREESTYLE FREEDOM LITE) w/Device KIT 1 kit by Does not apply route as needed. 1 kit 0  . finasteride (PROPECIA) 1 MG tablet Take 1 tablet by mouth daily.    . Glucose Blood (ASCENSIA CONTOUR TEST VI) 100 each by In Vitro route 1 day  or 1 dose.    . metFORMIN (GLUCOPHAGE-XR) 750 MG 24 hr tablet TAKE 2 TABLETS BY MOUTH WITH DINNER EVERY DAY 180 tablet 3  . Travoprost, BAK Free, (TRAVATAN) 0.004 % SOLN ophthalmic solution 1 drop at bedtime.    . triamcinolone (KENALOG) 0.025 % ointment Apply 1 application topically 2 (two) times daily. Not for face or private areas. 80 g 0  . vitamin B-12 (CYANOCOBALAMIN) 1000 MCG tablet Take 1,000 mcg by mouth daily.    . betamethasone dipropionate 0.05 % cream Apply 1 application topically as needed.    . cephALEXin (KEFLEX) 500 MG capsule Take 1 capsule (500 mg total) by mouth 3 (three) times daily. (Patient not taking: Reported on 01/31/2021) 21 capsule 0  . Lancets (FREESTYLE) lancets USE AS DIRECTED AS NEEDED (Patient not taking: Reported on 01/31/2021) 100 each 0  . Semaglutide (RYBELSUS) 3 MG TABS Take 3 mg before b'fast. (Patient not taking: No sig reported) 90 tablet 3  .  Urea 40 % GEL Apply 1 application topically daily. (Patient not taking: Reported on 01/31/2021) 15 mL 2   No facility-administered medications prior to visit.    Allergies  Allergen Reactions  . Crab [Shellfish Allergy] Hives  . Elemental Sulfur Rash  . Sulfa Antibiotics Hives    ROS Review of Systems  Constitutional: Negative.   HENT: Negative.   Eyes: Negative for photophobia and visual disturbance.  Respiratory: Negative.   Cardiovascular: Negative.   Gastrointestinal: Negative for anal bleeding and blood in stool.  Endocrine: Negative for polyphagia and polyuria.  Genitourinary: Negative.  Negative for hematuria.  Skin: Negative.   Allergic/Immunologic: Negative for immunocompromised state.  Neurological: Positive for numbness.  Hematological: Does not bruise/bleed easily.  Psychiatric/Behavioral: Negative.       Objective:    Physical Exam Vitals and nursing note reviewed.  Constitutional:      General: He is not in acute distress.    Appearance: Normal appearance. He is not ill-appearing, toxic-appearing or diaphoretic.  HENT:     Head: Normocephalic and atraumatic.     Right Ear: Tympanic membrane, ear canal and external ear normal.     Left Ear: Tympanic membrane, ear canal and external ear normal.     Mouth/Throat:     Mouth: Mucous membranes are moist.     Pharynx: Oropharynx is clear. No oropharyngeal exudate.  Eyes:     General: No scleral icterus.    Extraocular Movements: Extraocular movements intact.     Conjunctiva/sclera: Conjunctivae normal.     Pupils: Pupils are equal, round, and reactive to light.  Cardiovascular:     Rate and Rhythm: Normal rate and regular rhythm.  Pulmonary:     Effort: Pulmonary effort is normal.     Breath sounds: Normal breath sounds.  Abdominal:     General: Bowel sounds are normal.  Musculoskeletal:     Cervical back: No rigidity or tenderness.  Lymphadenopathy:     Cervical: No cervical adenopathy.  Skin:     General: Skin is warm and dry.     Coloration: Skin is not jaundiced.  Neurological:     Mental Status: He is alert and oriented to person, place, and time.  Psychiatric:        Mood and Affect: Mood normal.        Behavior: Behavior normal.     BP 140/88 (BP Location: Left Arm, Patient Position: Sitting, Cuff Size: Large)   Pulse 93   Temp (!) 97 F (36.1 C) (  Temporal)   Ht 6' 3" (1.905 m)   Wt 266 lb 9.6 oz (120.9 kg)   SpO2 98%   BMI 33.32 kg/m  Wt Readings from Last 3 Encounters:  01/31/21 266 lb 9.6 oz (120.9 kg)  12/13/20 266 lb (120.7 kg)  09/10/20 265 lb (120.2 kg)     Health Maintenance Due  Topic Date Due  . Hepatitis C Screening  Never done  . PNEUMOCOCCAL POLYSACCHARIDE VACCINE AGE 21-64 HIGH RISK  Never done  . INFLUENZA VACCINE  06/23/2020  . URINE MICROALBUMIN  10/22/2020  . FOOT EXAM  01/25/2021    There are no preventive care reminders to display for this patient.  Lab Results  Component Value Date   TSH 1.420 11/27/2019   Lab Results  Component Value Date   WBC 5.3 08/02/2020   HGB 16.2 08/02/2020   HCT 46.2 08/02/2020   MCV 87.5 08/02/2020   PLT 178.0 08/02/2020   Lab Results  Component Value Date   NA 138 08/02/2020   K 4.3 08/02/2020   CO2 28 08/02/2020   GLUCOSE 138 (H) 08/02/2020   BUN 14 08/02/2020   CREATININE 0.89 08/02/2020   BILITOT 1.0 08/02/2020   ALKPHOS 84 08/02/2020   AST 24 08/02/2020   ALT 40 08/02/2020   PROT 6.8 08/02/2020   ALBUMIN 4.7 08/02/2020   CALCIUM 9.4 08/02/2020   ANIONGAP 9 10/18/2019   GFR 89.99 08/02/2020   Lab Results  Component Value Date   CHOL 113 08/02/2020   Lab Results  Component Value Date   HDL 28.60 (L) 08/02/2020   Lab Results  Component Value Date   LDLCALC 64 08/02/2020   Lab Results  Component Value Date   TRIG 103.0 08/02/2020   Lab Results  Component Value Date   CHOLHDL 4 08/02/2020   Lab Results  Component Value Date   HGBA1C 6.7 (A) 12/13/2020       Assessment & Plan:   Problem List Items Addressed This Visit      Endocrine   Type 2 diabetes mellitus with diabetic neuropathy, without long-term current use of insulin (HCC)   Relevant Medications   lisinopril (ZESTRIL) 10 MG tablet   Other Relevant Orders   Urinalysis, Routine w reflex microscopic   Microalbumin / creatinine urine ratio     Nervous and Auditory   Neuropathy     Other   Elevated LDL cholesterol level - Primary   Relevant Orders   Comprehensive metabolic panel   Lipid panel   Obesity (BMI 30.0-34.9)   Elevated BP without diagnosis of hypertension   Relevant Medications   lisinopril (ZESTRIL) 10 MG tablet   Other Relevant Orders   CBC      Meds ordered this encounter  Medications  . lisinopril (ZESTRIL) 10 MG tablet    Sig: Take 1 tablet (10 mg total) by mouth daily.    Dispense:  90 tablet    Refill:  3    Follow-up: Return in about 6 months (around 08/03/2021), or if symptoms worsen or fail to improve.   Patient will start low-dose lisinopril for renal protection and elevated blood pressure.  Warned him of the cheat side effect which would be cough.  He will let me know.  He knows that we are looking for his blood pressure to settle in the low 130s over low 80s.  He was given information on BMI and calorie counting to lose weight and exercising to lose weight. Libby Maw, MD

## 2021-02-26 DIAGNOSIS — G4733 Obstructive sleep apnea (adult) (pediatric): Secondary | ICD-10-CM | POA: Diagnosis not present

## 2021-03-28 DIAGNOSIS — G4733 Obstructive sleep apnea (adult) (pediatric): Secondary | ICD-10-CM | POA: Diagnosis not present

## 2021-04-11 ENCOUNTER — Ambulatory Visit: Payer: BC Managed Care – PPO | Admitting: Internal Medicine

## 2021-04-11 ENCOUNTER — Other Ambulatory Visit: Payer: Self-pay

## 2021-04-11 ENCOUNTER — Encounter: Payer: Self-pay | Admitting: Internal Medicine

## 2021-04-11 VITALS — BP 130/88 | HR 82 | Ht 75.0 in | Wt 251.4 lb

## 2021-04-11 DIAGNOSIS — E538 Deficiency of other specified B group vitamins: Secondary | ICD-10-CM

## 2021-04-11 DIAGNOSIS — E785 Hyperlipidemia, unspecified: Secondary | ICD-10-CM

## 2021-04-11 DIAGNOSIS — E1140 Type 2 diabetes mellitus with diabetic neuropathy, unspecified: Secondary | ICD-10-CM

## 2021-04-11 LAB — POCT GLYCOSYLATED HEMOGLOBIN (HGB A1C): Hemoglobin A1C: 5.7 % — AB (ref 4.0–5.6)

## 2021-04-11 NOTE — Patient Instructions (Addendum)
Please continue: - Metformin ER 1500 mg with dinner - B12 1000 mcg daily  Please return in 6 months with your sugar log.

## 2021-04-11 NOTE — Progress Notes (Signed)
Patient ID: Terry Bonilla, male   DOB: [birth year], 50 y.o.   MRN: [id number]   This visit occurred during the SARS-CoV-2 public health emergency.  Safety protocols were in place, including screening questions prior to the visit, additional usage of staff PPE, and extensive cleaning of exam room while observing appropriate contact time as indicated for disinfecting solutions.   HPI: Terry Bonilla is a 50 y.o.-year-old male, referred by his PCP, Dr. Ethelene Hal, for management of DM2, dx on 10/15/2019, non-insulin-dependent, uncontrolled, with long-term complications (peripheral neuropathy).  Last visit 4 months ago.  Interim history: No increased urination, blurry vision, nausea. He started [employer] 02/2021 >> lost 15 lbs and sugars are better. He feels great.  He did start to diversify his diet a little lately.  DM2: Reviewed history: He was diagnosed with diabetes in 09/2019 after he presented to the ED with a syncopal episode.  In ED, glucose was 372 and he had glycosuria on urinalysis.  HbA1c was high.  He was started on Metformin.  Reviewed HbA1c levels: Lab Results  Component Value Date   HGBA1C 6.7 (A) 12/13/2020   HGBA1C 6.8 (A) 08/05/2020   HGBA1C 6.6 (A) 04/01/2020   HGBA1C 6.4 (A) 01/02/2020   HGBA1C 10.0 (H) 10/23/2019   Pt is on a regimen of:  - Metformin ER 750 mg 1x a day >> 1500 mg with dinner >> lightheadedness >> 750 mg 2x a day >> 1500 mg at dinnertime We discussed about Rybelsus 3 mg before breakfast at last OV - 07/2020, but not started yet.  He had palpitations with an instant release Metformin.  Pt checks his sugars twice a day - after starting the diet: - am: 91, 125-155, 185 >> 140-155,  180, 188 >> 109-148, 163 >> 89-126, 137 - 2h after b'fast: n/c - before lunch: n/c - 2h after lunch: n/c - before dinner: 130-145 >> 94-160s >> 87, 96-137, 151 >> 103-135, 140  - 2h after dinner: n/c - bedtime: 130, 141-169, 209 >> n/c - nighttime: n/c Lowest sugar was 130  >> 72 >> 91 >> 87 >> 89; it is unclear at which level he has hypoglycemia awareness. Highest sugar was 209 >> 184 >> 188 >> 169 >> 140.  Glucometer: [employer] brand  Feels improved after his diagnosis of diabetes: - Breakfast: [place] links >> protein bar - snack: bar - Lunch: chicken and broccoli >> lean green lunch: greens + white meat - pm: nuts >> brownie - Dinner: chicken >> lean and green dinner - Snack after dinner:bar He stopped sweet drinks.  He drinks 1-2 alcoholic drinks at night.  -No CKD, last BUN/creatinine:  Lab Results  Component Value Date   BUN 13 01/31/2021   BUN 14 08/02/2020   CREATININE 0.84 01/31/2021   CREATININE 0.89 08/02/2020  He was advised to start low-dose lisinopril 01/2021. Did not start yet.  -+ HL; last set of lipids: Lab Results  Component Value Date   CHOL 111 01/31/2021   HDL 26.50 (L) 01/31/2021   LDLCALC 53 01/31/2021   LDLDIRECT 118.0 10/23/2019   TRIG 157.0 (H) 01/31/2021   CHOLHDL 4 01/31/2021  On Lipitor 10.  - last eye exam was in 12/2020: No DR. He is on Travaprost for increased pressure.  -+ Numbness and tingling in his feet. He is seen by podiatry. He had ingrown toenail sx. X2.  Pt has FH of DM in elder brother.  He also has vitamin B12 deficiency:  We started B12 injections.  His shooting pain in his legs improved after starting this.  We were able to transition him to 1000 mcg p.o. B12.  Reviewed his B12 levels: Lab Results  Component Value Date   VITAMINB12 790 11/19/2020   VITAMINB12 473 08/02/2020   VITAMINB12 389 05/14/2020   VITAMINB12 196 (L) 01/30/2020   Allergic to sulfa.  She was on alfuzosin and meloxicam for prostatitis in the past.  He feels that these medications improved his dizziness.  He is now off these medications and dizziness has not recurred. He also has a history of alcohol use and transaminitis.    ROS: Constitutional: no weight gain/+ weight loss, no fatigue, no subjective hyperthermia, no  subjective hypothermia Eyes: no blurry vision, no xerophthalmia ENT: no sore throat, no nodules palpated in neck, no dysphagia, no odynophagia, no hoarseness Cardiovascular: no CP/no SOB/no palpitations/no leg swelling Respiratory: no cough/no SOB/no wheezing Gastrointestinal: no N/no V/no D/no C/no acid reflux Musculoskeletal: no muscle aches/no joint aches Skin: no rashes, no hair loss Neurological: no tremors/+ numbness in the tips of his toes/no tingling/no dizziness  I reviewed pt's medications, allergies, PMH, social hx, family hx, and changes were documented in the history of present illness. Otherwise, unchanged from my initial visit note.  Past Medical History:  Diagnosis Date  . Diabetes mellitus without complication ([facility]) 62/9476   pre-diabetes A1C 6.4  . Sleep apnea 2009   cpap  . Substance abuse ([place])    ETOH   Past Surgical History:  Procedure Laterality Date  . KIDNEY SURGERY Left 08/09/2020   benign cyst laser removal   . TONSILLECTOMY     age 14   Social History   Socioeconomic History  . Marital status: Single    Spouse name: Not on file  . Number of children: Not on file  . Years of education: Not on file  . Highest education level: Not on file  Occupational History  . Not on file  Tobacco Use  . Smoking status: Never Smoker  . Smokeless tobacco: Never Used  Vaping Use  . Vaping Use: Never used  Substance and Sexual Activity  . Alcohol use: Yes    Alcohol/week: 4.0 - 6.0 standard drinks    Types: 4 - 6 Standard drinks or equivalent per week    Comment: two drinks nightly occasionally   . Drug use: Never  . Sexual activity: Not on file  Other Topics Concern  . Not on file  Social History Narrative   Lives alone   Right handed   Caffeine: 1 cup/day   Social Determinants of Health   Financial Resource Strain: Not on file  Food Insecurity: Not on file  Transportation Needs: Not on file  Physical Activity: Not on file  Stress: Not on file   Social Connections: Not on file  Intimate Partner Violence: Not on file   Current Outpatient Medications on File Prior to Visit  Medication Sig Dispense Refill  . ammonium lactate (AMLACTIN) 12 % cream Apply topically as needed for dry skin. 385 g 0  . atorvastatin (LIPITOR) 10 MG tablet Take 1 tablet (10 mg total) by mouth daily. Takes at night 90 tablet 1  . betamethasone dipropionate 0.05 % cream Apply 1 application topically as needed.    . Blood Glucose Calibration (ASCENSIA CONTOUR VI) by In Vitro route.    . Blood Glucose Monitoring Suppl (FREESTYLE FREEDOM LITE) w/Device KIT 1 kit by Does not apply route as needed. 1 kit 0  . cephALEXin (KEFLEX)  500 MG capsule Take 1 capsule (500 mg total) by mouth 3 (three) times daily. (Patient not taking: Reported on 01/31/2021) 21 capsule 0  . finasteride (PROPECIA) 1 MG tablet Take 1 tablet by mouth daily.    . Glucose Blood (ASCENSIA CONTOUR TEST VI) 100 each by In Vitro route 1 day or 1 dose.    . Lancets (FREESTYLE) lancets USE AS DIRECTED AS NEEDED (Patient not taking: Reported on 01/31/2021) 100 each 0  . lisinopril (ZESTRIL) 10 MG tablet Take 1 tablet (10 mg total) by mouth daily. 90 tablet 3  . metFORMIN (GLUCOPHAGE-XR) 750 MG 24 hr tablet TAKE 2 TABLETS BY MOUTH WITH DINNER EVERY DAY 180 tablet 3  . Semaglutide (RYBELSUS) 3 MG TABS Take 3 mg before b'fast. (Patient not taking: No sig reported) 90 tablet 3  . Travoprost, BAK Free, (TRAVATAN) 0.004 % SOLN ophthalmic solution 1 drop at bedtime.    . triamcinolone (KENALOG) 0.025 % ointment Apply 1 application topically 2 (two) times daily. Not for face or private areas. 80 g 0  . Urea 40 % GEL Apply 1 application topically daily. (Patient not taking: Reported on 01/31/2021) 15 mL 2  . vitamin B-12 (CYANOCOBALAMIN) 1000 MCG tablet Take 1,000 mcg by mouth daily.     No current facility-administered medications on file prior to visit.   Allergies  Allergen Reactions  . Crab [Shellfish Allergy]  Hives  . Elemental Sulfur Rash  . Sulfa Antibiotics Hives   PMH: -Pertinent PMH reviewed in HPI  PE: BP 130/88 (BP Location: Right Arm, Patient Position: Sitting, Cuff Size: Normal)   Pulse 82   Ht 6' 3" (1.905 m)   Wt 251 lb 6.4 oz (114 kg)   SpO2 99%   BMI 31.42 kg/m  Wt Readings from Last 3 Encounters:  04/11/21 251 lb 6.4 oz (114 kg)  01/31/21 266 lb 9.6 oz (120.9 kg)  12/13/20 266 lb (120.7 kg)   Constitutional: overweight, in NAD Eyes: PERRLA, EOMI, no exophthalmos ENT: moist mucous membranes, no thyromegaly, no cervical lymphadenopathy Cardiovascular: RRR, No MRG Respiratory: CTA B Gastrointestinal: abdomen soft, NT, ND, BS+ Musculoskeletal: no deformities, strength intact in all 4 Skin: moist, warm, no rashes Neurological: no tremor with outstretched hands, DTR normal in all 4  ASSESSMENT: 1. DM2, non-insulin-dependent, uncontrolled, with complications - PN  He does not have a family history of medullary thyroid cancer or personal history of pancreatitis.  2. HL  3.  Vitamin B12 deficiency  PLAN:  1. Patient with a relatively recent diagnosis of diabetes, diagnosed after a syncopal episode in 09/2019.  He was initially started on metformin ER, which he could not tolerate due to palpitations.  He was switched to metformin ER, which he tolerates well.  He saw nutrition and change his diet after his diagnosis of diabetes.  He also stopped sweet drinks.  His sugars improved -at last visit, HbA1c was 6.7%.  At that time, he mentioned that he did not start Rybelsus as suggested at the previous visit.  He was working further on his diet and did notice that his sugars were higher in the morning after drinking alcohol the night before.  We discussed about limiting alcohol, but we did not change his regimen at that time. -At today's visit, his sugars are  improved in the last month, after starting [place].  He just started to add more grains with lunch, but overall,  sugars remained well controlled.  He had hyperglycemic spikes in the 130s only  with fried foods, but otherwise, sugars are all at goal.  Since he is diversify his diet, I advised him to continue with the same dose of metformin ER, but I did advise him to try to decrease the dose to only 1 tablet if his sugars remain between 80 and 100. -He is checking sugars twice a day and I advised him that he can reduce this to only once a day -He asks me my opinion about adding lisinopril.  PCP recommended this but he did not start it yet.  I explained situations in which we use lisinopril in patients with diabetes: Either CKD or microalbuminuria or hypertension.  His blood pressure is above target today, but since he lost 15 pounds in the last month and would prefer to hold off for now, we discussed that this is probably ok for now until his weight stabilizes. - I suggested to:  Patient Instructions  Please continue: - Metformin ER 1500 mg with dinner - B12 1000 mcg daily  Please return in 4-6 months with your sugar log.   - we checked his HbA1c: 5.7% (better) - advised to check sugars at different times of the day - 1x a day, rotating check times - advised for yearly eye exams >> he is UTD - return to clinic in 4-6 months  2. HL -I reviewed latest lipid panel from 03/2022L LDL at goal, HDL low: Lab Results  Component Value Date   CHOL 111 01/31/2021   HDL 26.50 (L) 01/31/2021   LDLCALC 53 01/31/2021   LDLDIRECT 118.0 10/23/2019   TRIG 157.0 (H) 01/31/2021   CHOLHDL 4 01/31/2021  -On Lipitor 10 mg daily, tolerated well.  3.  Vitamin B12 deficiency -He was initially on B12 injections, but transition to p.o. B12 1000 mcg daily -Tingling improved after starting B12 but he still has some tingling on the tips of his fingers -We will repeat his B12 level at next OV  Philemon Kingdom, MD PhD [facility] Endocrinology

## 2021-04-28 DIAGNOSIS — G4733 Obstructive sleep apnea (adult) (pediatric): Secondary | ICD-10-CM | POA: Diagnosis not present

## 2021-05-05 DIAGNOSIS — H401131 Primary open-angle glaucoma, bilateral, mild stage: Secondary | ICD-10-CM | POA: Diagnosis not present

## 2021-05-28 DIAGNOSIS — G4733 Obstructive sleep apnea (adult) (pediatric): Secondary | ICD-10-CM | POA: Diagnosis not present

## 2021-06-28 DIAGNOSIS — G4733 Obstructive sleep apnea (adult) (pediatric): Secondary | ICD-10-CM | POA: Diagnosis not present

## 2021-07-09 ENCOUNTER — Other Ambulatory Visit: Payer: Self-pay | Admitting: Family

## 2021-07-09 DIAGNOSIS — E7800 Pure hypercholesterolemia, unspecified: Secondary | ICD-10-CM

## 2021-07-15 NOTE — Telephone Encounter (Signed)
Pt refill request went to Dr Justin Mend, still pending... can Dr Ethelene Hal fill the Atorvastin ?

## 2021-07-18 NOTE — Telephone Encounter (Signed)
Pt called to follow up about this 07/18/21 Can dr Ethelene Hal approve this?

## 2021-07-18 NOTE — Progress Notes (Signed)
[facility] [place], [place] [address]  Pulmonary Sleep Medicine   Office Visit Note  Patient Name: Terry Bonilla DOB: 08/04/1971 MRN [id number]    Chief Complaint: Obstructive Sleep Apnea visit  Brief History:  Brently is seen today for 1 year follow up The patient has a 13 year history of sleep apnea. Patient is using PAP nightly @ 10 cmH2O.  The patient feels refreshed after sleeping with PAP.  The patient reports benefiting from PAP use. Reported sleepiness is monthly replacement supplies and complains of no problems.  The compliance download shows  compliance with an average use time of 8:43 hours @ 100%. The AHI is 2.5  The patient does complain of limb movements disrupting sleep.  ROS  General: (-) fever, (-) chills, (-) night sweat Nose and Sinuses: (-) nasal stuffiness or itchiness, (-) postnasal drip, (-) nosebleeds, (-) sinus trouble. Mouth and Throat: (-) sore throat, (-) hoarseness. Neck: (-) swollen glands, (-) enlarged thyroid, (-) neck pain. Respiratory: - cough, - shortness of breath, - wheezing. Neurologic: - numbness, - tingling. Psychiatric: - anxiety, - depression   Current Medication: Outpatient Encounter Medications as of 07/21/2021  Medication Sig Note   ammonium lactate (AMLACTIN) 12 % cream Apply topically as needed for dry skin. 08/02/2020: prn   atorvastatin (LIPITOR) 10 MG tablet Take 1 tablet (10 mg total) by mouth daily. Takes at night    betamethasone dipropionate 0.05 % cream Apply 1 application topically as needed. 08/02/2020: prn   Blood Glucose Calibration (ASCENSIA CONTOUR VI) by In Vitro route.    Blood Glucose Monitoring Suppl (FREESTYLE FREEDOM LITE) w/Device KIT 1 kit by Does not apply route as needed.    finasteride (PROPECIA) 1 MG tablet Take 1 tablet by mouth daily.    Glucose Blood (ASCENSIA CONTOUR TEST VI) 100 each by In Vitro route 1 day or 1 dose.    Lancets (FREESTYLE) lancets USE AS DIRECTED AS NEEDED     lisinopril (ZESTRIL) 10 MG tablet Take 1 tablet (10 mg total) by mouth daily. (Patient not taking: Reported on 04/11/2021) 04/11/2021: Pt has not started   metFORMIN (GLUCOPHAGE-XR) 750 MG 24 hr tablet TAKE 2 TABLETS BY MOUTH WITH DINNER EVERY DAY    Travoprost, BAK Free, (TRAVATAN) 0.004 % SOLN ophthalmic solution 1 drop at bedtime.    triamcinolone (KENALOG) 0.025 % ointment Apply 1 application topically 2 (two) times daily. Not for face or private areas.    Urea 40 % GEL Apply 1 application topically daily. (Patient not taking: No sig reported)    vitamin B-12 (CYANOCOBALAMIN) 1000 MCG tablet Take 1,000 mcg by mouth daily.    No facility-administered encounter medications on file as of 07/21/2021.    Surgical History: Past Surgical History:  Procedure Laterality Date   KIDNEY SURGERY Left 08/09/2020   benign cyst laser removal    TONSILLECTOMY     age 7    Medical History: Past Medical History:  Diagnosis Date   Diabetes mellitus without complication ([place]) 12/5425   pre-diabetes A1C 6.4   Sleep apnea 2009   cpap   Substance abuse ([place])    ETOH    Family History: Non contributory to the present illness  Social History: Social History   Socioeconomic History   Marital status: Single    Spouse name: Not on file   Number of children: Not on file   Years of education: Not on file   Highest education level: Not on file  Occupational  History   Not on file  Tobacco Use   Smoking status: Never   Smokeless tobacco: Never  Vaping Use   Vaping Use: Never used  Substance and Sexual Activity   Alcohol use: Yes    Alcohol/week: 4.0 - 6.0 standard drinks    Types: 4 - 6 Standard drinks or equivalent per week    Comment: two drinks nightly occasionally    Drug use: Never   Sexual activity: Not on file  Other Topics Concern   Not on file  Social History Narrative   Lives alone   Right handed   Caffeine: 1 cup/day   Social Determinants of Health   Financial Resource  Strain: Not on file  Food Insecurity: Not on file  Transportation Needs: Not on file  Physical Activity: Not on file  Stress: Not on file  Social Connections: Not on file  Intimate Partner Violence: Not on file    Vital Signs: Blood pressure (!) 148/59, pulse 75, resp. rate 18, height _0  (1.905 m), weight 257 lb (116.6 kg), SpO2 99 %.  Examination: General Appearance: The patient is well-developed, well-nourished, and in no distress. Neck Circumference: 52 cm Skin: Gross inspection of skin unremarkable. Head: normocephalic, no gross deformities. Eyes: no gross deformities noted. ENT: ears appear grossly normal Neurologic: Alert and oriented. No involuntary movements.    EPWORTH SLEEPINESS SCALE:  Scale:  (0)= no chance of dozing; (1)= slight chance of dozing; (2)= moderate chance of dozing; (3)= high chance of dozing  Chance  Situtation    Sitting and reading: 0    Watching TV: 0    Sitting Inactive in public: 0    As a passenger in car: 0      Lying down to rest: 1    Sitting and talking: 0    Sitting quielty after lunch: 0    In a car, stopped in traffic: 0   TOTAL SCORE:   1 out of 24    SLEEP STUDIES:  PSG 05/2008, AHI 46.1, SpO2 minimum 78.5%, CPAP 03/2016, recommend 10 cm H2O   CPAP COMPLIANCE DATA:  Date Range: 07/16/20 - 07/15/21  Average Daily Use: 8:43 hours  Median Use: 8:43 hours  Compliance for > 4 Hours: 100%  AHI: 2.5 respiratory events per hour  Days Used: 365/365  Mask Leak: 45.9 lpm  95th Percentile Pressure: 10 cmH2O         LABS: No results found for this or any previous visit (from the past 2160 hour(s)).  Radiology: VAS US CAROTID  Result Date: 09/17/2020 Carotid Arterial Duplex Study Indications:       Visual disturbance. Risk Factors:      Diabetes. Comparison Study:  no prior Performing Technologist: [name] RVS  Examination Guidelines: A complete evaluation includes B-mode imaging, spectral  Doppler, color Doppler, and power Doppler as needed of all accessible portions of each vessel. Bilateral testing is considered an integral part of a complete examination. Limited examinations for reoccurring indications may be performed as noted.  Right Carotid Findings: +----------+--------+--------+--------+------------------+--------+           PSV cm/sEDV cm/sStenosisPlaque DescriptionComments +----------+--------+--------+--------+------------------+--------+ CCA Prox  126     29              heterogenous               +----------+--------+--------+--------+------------------+--------+ CCA Distal76      19              heterogenous               +----------+--------+--------+--------+------------------+--------+  ICA Prox  77      33      1-39%   heterogenous               +----------+--------+--------+--------+------------------+--------+ ICA Distal86      33                                         +----------+--------+--------+--------+------------------+--------+ ECA       96      18                                         +----------+--------+--------+--------+------------------+--------+ +----------+--------+-------+--------+-------------------+           PSV cm/sEDV cmsDescribeArm Pressure (mmHG) +----------+--------+-------+--------+-------------------+ [id number]                                         +----------+--------+-------+--------+-------------------+ +---------+--------+--+--------+--+---------+ VertebralPSV cm/s33EDV cm/s10Antegrade +---------+--------+--+--------+--+---------+  Left Carotid Findings: +----------+--------+--------+--------+------------------+--------+           PSV cm/sEDV cm/sStenosisPlaque DescriptionComments +----------+--------+--------+--------+------------------+--------+ CCA Prox  115     25              heterogenous                +----------+--------+--------+--------+------------------+--------+ CCA Distal72      24              heterogenous               +----------+--------+--------+--------+------------------+--------+ ICA Prox  68      28      1-39%   heterogenous               +----------+--------+--------+--------+------------------+--------+ ICA Distal82      37                                         +----------+--------+--------+--------+------------------+--------+ ECA       81      13                                         +----------+--------+--------+--------+------------------+--------+ +----------+--------+--------+--------+-------------------+           PSV cm/sEDV cm/sDescribeArm Pressure (mmHG) +----------+--------+--------+--------+-------------------+ [id number]                                          +----------+--------+--------+--------+-------------------+ +---------+--------+--+--------+--+---------+ VertebralPSV cm/s39EDV cm/s13Antegrade +---------+--------+--+--------+--+---------+   Summary: Right Carotid: Velocities in the right ICA are consistent with a 1-39% stenosis. Left Carotid: Velocities in the left ICA are consistent with a 1-39% stenosis. Vertebrals: Bilateral vertebral arteries demonstrate antegrade flow. *See table(s) above for measurements and observations.  Electronically signed by Antony Contras MD on 09/17/2020 at 2:08:43 PM.    Final     No results found.  No results found.    Assessment and Plan: Patient Active Problem List   Diagnosis Date Noted   OSA on CPAP 07/21/2021   CPAP use counseling 07/21/2021  Elevated BP without diagnosis of hypertension 01/31/2021   Dizziness 09/10/2020   Ingrown toenail 03/04/2020   B12 deficiency 01/31/2020   Neuropathy 01/31/2020   Elevated LDL cholesterol level 01/26/2020   Flexural eczema 01/26/2020   Lightheadedness 11/28/2019   Elevated liver enzymes 11/28/2019   Alcohol use 11/28/2019    Palpitations 10/30/2019   Type 2 diabetes mellitus with diabetic neuropathy, without long-term current use of insulin ([place]) 10/23/2019   Screening for blood disease 10/23/2019   Need for influenza vaccination 10/23/2019   Atypical nevus 10/23/2019   Obesity (BMI 30.0-34.9) 03/20/2016   1. OSA on CPAP The patient does tolerate PAP and reports definite benefit from PAP use. The patient was reminded how to clean equipment  and advised to replace supplies routinely. The patient was also counselled on weight loss. The compliance is excellent . The AHI is 2.5.   OSA- continue excellent compliance with pap. F/u one year   2. CPAP use counseling CPAP Counseling: had a lengthy discussion with the patient regarding the importance of PAP therapy in management of the sleep apnea. Patient appears to understand the risk factor reduction and also understands the risks associated with untreated sleep apnea. Patient will try to make a good faith effort to remain compliant with therapy. Also instructed the patient on proper cleaning of the device including the water must be changed daily if possible and use of distilled water is preferred. Patient understands that the machine should be regularly cleaned with appropriate recommended cleaning solutions that do not damage the PAP machine for example given white vinegar and water rinses. Other methods such as ozone treatment may not be as good as these simple methods to achieve cleaning.   3. Obesity (BMI 30.0-34.9) Obesity Counseling: Had a lengthy discussion regarding patients BMI and weight issues. Patient was instructed on portion control as well as increased activity. Also discussed caloric restrictions with trying to maintain intake less than 2000 Kcal. Discussions were made in accordance with the 5As of weight management. Simple actions such as not eating late and if able to, taking a walk is suggested.   4. Elevated BP without diagnosis of  hypertension Discussed today's bp. He is asymptomatic. Not taking his lisinopril. Will buy bp monitor and monitor at home and discuss with pcp whom he sees in 3 weeks.     General Counseling: I have discussed the findings of the evaluation and examination with Ovid Curd.  I have also discussed any further diagnostic evaluation thatmay be needed or ordered today. Omarion verbalizes understanding of the findings of todays visit. We also reviewed his medications today and discussed drug interactions and side effects including but not limited excessive drowsiness and altered mental states. We also discussed that there is always a risk not just to him but also people around him. he has been encouraged to call the office with any questions or concerns that should arise related to todays visit.  No orders of the defined types were placed in this encounter.       I have personally obtained a history, examined the patient, evaluated laboratory and imaging results, formulated the assessment and plan and placed orders.   This patient was seen today by Tressie Ellis, PA-C in collaboration with Dr. Devona Konig.    Allyne Gee, MD [facility] Diplomate ABMS Pulmonary and Critical Care Medicine Sleep medicine

## 2021-07-21 ENCOUNTER — Ambulatory Visit (INDEPENDENT_AMBULATORY_CARE_PROVIDER_SITE_OTHER): Payer: BC Managed Care – PPO | Admitting: Internal Medicine

## 2021-07-21 VITALS — BP 148/59 | HR 75 | Resp 18 | Ht 75.0 in | Wt 257.0 lb

## 2021-07-21 DIAGNOSIS — Z1159 Encounter for screening for other viral diseases: Secondary | ICD-10-CM | POA: Insufficient documentation

## 2021-07-21 DIAGNOSIS — G4733 Obstructive sleep apnea (adult) (pediatric): Secondary | ICD-10-CM | POA: Diagnosis not present

## 2021-07-21 DIAGNOSIS — Z9989 Dependence on other enabling machines and devices: Secondary | ICD-10-CM

## 2021-07-21 DIAGNOSIS — R030 Elevated blood-pressure reading, without diagnosis of hypertension: Secondary | ICD-10-CM | POA: Diagnosis not present

## 2021-07-21 DIAGNOSIS — Z7189 Other specified counseling: Secondary | ICD-10-CM

## 2021-07-21 DIAGNOSIS — E669 Obesity, unspecified: Secondary | ICD-10-CM

## 2021-07-21 NOTE — Patient Instructions (Signed)

## 2021-07-22 NOTE — Telephone Encounter (Signed)
Medication was called in to the pharmacy per med refill protocol. Patient has an upcoming appointemt with provider.

## 2021-07-23 ENCOUNTER — Other Ambulatory Visit: Payer: Self-pay

## 2021-07-23 DIAGNOSIS — E7800 Pure hypercholesterolemia, unspecified: Secondary | ICD-10-CM

## 2021-07-23 MED ORDER — ATORVASTATIN CALCIUM 10 MG PO TABS: 10.0000 mg | ORAL_TABLET | Freq: Every day | ORAL | 0 refills | Status: DC

## 2021-07-29 DIAGNOSIS — G4733 Obstructive sleep apnea (adult) (pediatric): Secondary | ICD-10-CM | POA: Diagnosis not present

## 2021-08-08 ENCOUNTER — Ambulatory Visit: Payer: BC Managed Care – PPO | Admitting: Family Medicine

## 2021-08-19 ENCOUNTER — Ambulatory Visit: Payer: BC Managed Care – PPO | Admitting: Family Medicine

## 2021-08-19 ENCOUNTER — Other Ambulatory Visit: Payer: Self-pay

## 2021-08-19 ENCOUNTER — Encounter: Payer: Self-pay | Admitting: Family Medicine

## 2021-08-19 VITALS — BP 148/82 | HR 94 | Temp 98.3°F | Ht 75.0 in | Wt 255.6 lb

## 2021-08-19 DIAGNOSIS — Z0000 Encounter for general adult medical examination without abnormal findings: Secondary | ICD-10-CM

## 2021-08-19 DIAGNOSIS — R030 Elevated blood-pressure reading, without diagnosis of hypertension: Secondary | ICD-10-CM

## 2021-08-19 DIAGNOSIS — Z789 Other specified health status: Secondary | ICD-10-CM

## 2021-08-19 DIAGNOSIS — E7800 Pure hypercholesterolemia, unspecified: Secondary | ICD-10-CM | POA: Diagnosis not present

## 2021-08-19 DIAGNOSIS — E538 Deficiency of other specified B group vitamins: Secondary | ICD-10-CM | POA: Diagnosis not present

## 2021-08-19 DIAGNOSIS — G629 Polyneuropathy, unspecified: Secondary | ICD-10-CM | POA: Diagnosis not present

## 2021-08-19 DIAGNOSIS — E1140 Type 2 diabetes mellitus with diabetic neuropathy, unspecified: Secondary | ICD-10-CM

## 2021-08-19 DIAGNOSIS — Z7289 Other problems related to lifestyle: Secondary | ICD-10-CM

## 2021-08-19 LAB — COMPREHENSIVE METABOLIC PANEL
ALT: 31 U/L (ref 0–53)
AST: 21 U/L (ref 0–37)
Albumin: 4.8 g/dL (ref 3.5–5.2)
Alkaline Phosphatase: 68 U/L (ref 39–117)
BUN: 18 mg/dL (ref 6–23)
CO2: 24 mEq/L (ref 19–32)
Calcium: 9.6 mg/dL (ref 8.4–10.5)
Chloride: 102 mEq/L (ref 96–112)
Creatinine, Ser: 0.71 mg/dL (ref 0.40–1.50)
GFR: 105.56 mL/min (ref >60.00)
Glucose, Bld: 114 mg/dL — ABNORMAL HIGH (ref 70–99)
Potassium: 3.8 mEq/L (ref 3.5–5.1)
Sodium: 137 mEq/L (ref 135–145)
Total Bilirubin: 0.8 mg/dL (ref 0.2–1.2)
Total Protein: 7.2 g/dL (ref 6.0–8.3)

## 2021-08-19 LAB — CBC
HCT: 45.1 % (ref 39.0–52.0)
Hemoglobin: 15.9 g/dL (ref 13.0–17.0)
MCHC: 35.1 g/dL (ref 30.0–36.0)
MCV: 88.8 fl (ref 78.0–100.0)
Platelets: 153 10*3/uL (ref 150.0–400.0)
RBC: 5.08 Mil/uL (ref 4.22–5.81)
RDW: 12.6 % (ref 11.5–15.5)
WBC: 6 10*3/uL (ref 4.0–10.5)

## 2021-08-19 LAB — PSA: PSA: 0.26 ng/mL (ref 0.10–4.00)

## 2021-08-19 LAB — LDL CHOLESTEROL, DIRECT: Direct LDL: 86 mg/dL

## 2021-08-19 LAB — VITAMIN B12: Vitamin B-12: 741 pg/mL (ref 211–911)

## 2021-08-19 NOTE — Progress Notes (Signed)
Established Patient Office Visit  Subjective:  Patient ID: Terry Bonilla, male    DOB: 02-26-1971  Age: 50 y.o. MRN: [id number]  CC:  Chief Complaint  Patient presents with   Follow-up    6 mo f/u. Pt c/o tingling feeling in both feet since starting Metformin. Bs range 95-145    HPI Terry Bonilla presents for follow-up of elevated blood pressure, elevated cholesterol, peripheral neuropathy and B12 deficiency.  Patient has been able to lose some weight.  Last hemoglobin A1c was 5.7.  He never started the lisinopril as prescribed.  Wanted to see what weight loss would do.  Continues with his atorvastatin and B12.  Both feet are numb on the soles and the forefoot area.  He occasionally has cold sensation and they bother him at times when he walks playing golf.  Past Medical History:  Diagnosis Date   Diabetes mellitus without complication ([place]) 06/6577   pre-diabetes A1C 6.4   Sleep apnea 2009   cpap   Substance abuse ([place])    ETOH    Past Surgical History:  Procedure Laterality Date   KIDNEY SURGERY Left 08/09/2020   benign cyst laser removal    TONSILLECTOMY     age 48    Family History  Problem Relation Age of Onset   Ovarian cancer Mother    Arrhythmia Father    Diabetes type II Brother    Colon cancer Neg Hx    Colon polyps Neg Hx    Esophageal cancer Neg Hx    Rectal cancer Neg Hx    Stomach cancer Neg Hx     Social History   Socioeconomic History   Marital status: Single    Spouse name: Not on file   Number of children: Not on file   Years of education: Not on file   Highest education level: Not on file  Occupational History   Not on file  Tobacco Use   Smoking status: Never   Smokeless tobacco: Never  Vaping Use   Vaping Use: Never used  Substance and Sexual Activity   Alcohol use: Yes    Alcohol/week: 4.0 - 6.0 standard drinks    Types: 4 - 6 Standard drinks or equivalent per week    Comment: two drinks nightly occasionally    Drug use: Never    Sexual activity: Not on file  Other Topics Concern   Not on file  Social History Narrative   Lives alone   Right handed   Caffeine: 1 cup/day   Social Determinants of Health   Financial Resource Strain: Not on file  Food Insecurity: Not on file  Transportation Needs: Not on file  Physical Activity: Not on file  Stress: Not on file  Social Connections: Not on file  Intimate Partner Violence: Not on file    Outpatient Medications Prior to Visit  Medication Sig Dispense Refill   ammonium lactate (AMLACTIN) 12 % cream Apply topically as needed for dry skin. 385 g 0   atorvastatin (LIPITOR) 10 MG tablet Take 1 tablet (10 mg total) by mouth daily. Takes at night 90 tablet 0   betamethasone dipropionate 0.05 % cream Apply 1 application topically as needed.     Blood Glucose Calibration (ASCENSIA CONTOUR VI) by In Vitro route.     Blood Glucose Monitoring Suppl (FREESTYLE FREEDOM LITE) w/Device KIT 1 kit by Does not apply route as needed. 1 kit 0   finasteride (PROPECIA) 1 MG tablet Take 1 tablet by  mouth daily.     Glucose Blood (ASCENSIA CONTOUR TEST VI) 100 each by In Vitro route 1 day or 1 dose.     Lancets (FREESTYLE) lancets USE AS DIRECTED AS NEEDED 100 each 0   latanoprost (XALATAN) 0.005 % ophthalmic solution 1 drop at bedtime.     lisinopril (ZESTRIL) 10 MG tablet Take 1 tablet (10 mg total) by mouth daily. (Patient not taking: Reported on 04/11/2021) 90 tablet 3   metFORMIN (GLUCOPHAGE-XR) 750 MG 24 hr tablet TAKE 2 TABLETS BY MOUTH WITH DINNER EVERY DAY 180 tablet 3   triamcinolone (KENALOG) 0.025 % ointment Apply 1 application topically 2 (two) times daily. Not for face or private areas. 80 g 0   Urea 40 % GEL Apply 1 application topically daily. (Patient not taking: No sig reported) 15 mL 2   vitamin B-12 (CYANOCOBALAMIN) 1000 MCG tablet Take 1,000 mcg by mouth daily.     Travoprost, BAK Free, (TRAVATAN) 0.004 % SOLN ophthalmic solution 1 drop at bedtime.     No  facility-administered medications prior to visit.    Allergies  Allergen Reactions   Crab [Shellfish Allergy] Hives   Elemental Sulfur Rash   Sulfa Antibiotics Hives    ROS Review of Systems  Constitutional: Negative.   HENT: Negative.    Eyes:  Negative for photophobia and visual disturbance.  Respiratory: Negative.    Cardiovascular: Negative.   Gastrointestinal: Negative.   Endocrine: Negative for polyphagia and polyuria.  Genitourinary:  Negative for difficulty urinating, frequency and urgency.  Neurological:  Positive for numbness. Negative for weakness.  Psychiatric/Behavioral: Negative.       Objective:    Physical Exam Vitals and nursing note reviewed.  Constitutional:      General: He is not in acute distress.    Appearance: Normal appearance. He is not ill-appearing, toxic-appearing or diaphoretic.  HENT:     Head: Normocephalic and atraumatic.     Right Ear: Tympanic membrane, ear canal and external ear normal.     Left Ear: Tympanic membrane, ear canal and external ear normal.     Mouth/Throat:     Mouth: Mucous membranes are moist.     Pharynx: Oropharynx is clear. No oropharyngeal exudate or posterior oropharyngeal erythema.  Eyes:     Extraocular Movements: Extraocular movements intact.     Conjunctiva/sclera: Conjunctivae normal.     Pupils: Pupils are equal, round, and reactive to light.  Neck:     Vascular: No carotid bruit.  Cardiovascular:     Rate and Rhythm: Normal rate and regular rhythm.     Pulses:          Dorsalis pedis pulses are 2+ on the right side and 2+ on the left side.       Posterior tibial pulses are 2+ on the right side and 2+ on the left side.  Pulmonary:     Effort: Pulmonary effort is normal.     Breath sounds: Normal breath sounds.  Abdominal:     General: Bowel sounds are normal. There is no distension.     Palpations: There is no mass.     Tenderness: There is no abdominal tenderness. There is no guarding or rebound.      Hernia: No hernia is present.  Musculoskeletal:     Cervical back: No rigidity or tenderness.     Right lower leg: No edema.     Left lower leg: No edema.  Lymphadenopathy:     Cervical: No cervical adenopathy.  Skin:    General: Skin is warm and dry.  Neurological:     Mental Status: He is alert and oriented to person, place, and time.  Psychiatric:        Mood and Affect: Mood normal.        Behavior: Behavior normal.    BP (!) 148/82 (BP Location: Right Arm, Patient Position: Sitting, Cuff Size: Large)   Pulse 94   Temp 98.3 F (36.8 C) (Temporal)   Ht '6\' 3"'  (1.905 m)   Wt 255 lb 9.6 oz (115.9 kg)   SpO2 97%   BMI 31.95 kg/m  Wt Readings from Last 3 Encounters:  08/19/21 255 lb 9.6 oz (115.9 kg)  07/21/21 257 lb (116.6 kg)  04/11/21 251 lb 6.4 oz (114 kg)     Health Maintenance Due  Topic Date Due   Hepatitis C Screening  Never done   Zoster Vaccines- Shingrix (1 of 2) Never done   COVID-19 Vaccine (4 - Booster for Pfizer series) 03/08/2021    There are no preventive care reminders to display for this patient.  Lab Results  Component Value Date   TSH 1.420 11/27/2019   Lab Results  Component Value Date   WBC 5.1 01/31/2021   HGB 16.3 01/31/2021   HCT 45.9 01/31/2021   MCV 87.4 01/31/2021   PLT 162.0 01/31/2021   Lab Results  Component Value Date   NA 137 01/31/2021   K 4.1 01/31/2021   CO2 29 01/31/2021   GLUCOSE 151 (H) 01/31/2021   BUN 13 01/31/2021   CREATININE 0.84 01/31/2021   BILITOT 1.3 (H) 01/31/2021   ALKPHOS 98 01/31/2021   AST 29 01/31/2021   ALT 53 01/31/2021   PROT 6.7 01/31/2021   ALBUMIN 4.4 01/31/2021   CALCIUM 9.3 01/31/2021   ANIONGAP 9 10/18/2019   GFR 100.72 01/31/2021   Lab Results  Component Value Date   CHOL 111 01/31/2021   Lab Results  Component Value Date   HDL 26.50 (L) 01/31/2021   Lab Results  Component Value Date   LDLCALC 53 01/31/2021   Lab Results  Component Value Date   TRIG 157.0 (H)  01/31/2021   Lab Results  Component Value Date   CHOLHDL 4 01/31/2021   Lab Results  Component Value Date   HGBA1C 5.7 (A) 04/11/2021      Assessment & Plan:   Problem List Items Addressed This Visit       Endocrine   Type 2 diabetes mellitus with diabetic neuropathy, without long-term current use of insulin (HCC)   Relevant Orders   Comprehensive metabolic panel   Urinalysis, Routine w reflex microscopic     Nervous and Auditory   Neuropathy     Other   Alcohol use   Elevated LDL cholesterol level - Primary   Relevant Orders   Comprehensive metabolic panel   LDL cholesterol, direct   B12 deficiency   Relevant Orders   Vitamin B12   Elevated BP without diagnosis of hypertension   Relevant Orders   CBC   Comprehensive metabolic panel   Urinalysis, Routine w reflex microscopic   Other Visit Diagnoses     Healthcare maintenance       Relevant Orders   PSA       No orders of the defined types were placed in this encounter.   Follow-up: Return in about 3 months (around 11/18/2021).  Please start lisinopril 10 mg daily.  Information was given about gabapentin.  Congratulated him  on his weight loss and encouraged him to continue.  Information was given on hypertension.  Reminded him that we use the lisinopril for renal protection as well as blood pressure control.  Libby Maw, MD

## 2021-08-20 LAB — URINALYSIS, ROUTINE W REFLEX MICROSCOPIC
Bilirubin Urine: NEGATIVE
Ketones, ur: NEGATIVE
Leukocytes,Ua: NEGATIVE
Nitrite: NEGATIVE
RBC / HPF: NONE SEEN
Specific Gravity, Urine: 1.015 (ref 1.000–1.030)
Total Protein, Urine: NEGATIVE
Urine Glucose: NEGATIVE
Urobilinogen, UA: 0.2 (ref 0.0–1.0)
pH: 6 (ref 5.0–8.0)

## 2021-08-20 NOTE — Addendum Note (Signed)
Addended by: Lynnea Ferrier on: 08/20/2021 09:36 AM   Modules accepted: Orders

## 2021-08-20 NOTE — Addendum Note (Signed)
Addended by: Lynnea Ferrier on: 08/20/2021 09:35 AM   Modules accepted: Orders

## 2021-08-31 DIAGNOSIS — G4733 Obstructive sleep apnea (adult) (pediatric): Secondary | ICD-10-CM | POA: Diagnosis not present

## 2021-09-01 ENCOUNTER — Ambulatory Visit: Payer: BC Managed Care – PPO | Admitting: Internal Medicine

## 2021-09-01 ENCOUNTER — Encounter: Payer: Self-pay | Admitting: Internal Medicine

## 2021-09-01 ENCOUNTER — Other Ambulatory Visit: Payer: Self-pay

## 2021-09-01 VITALS — BP 138/90 | HR 85 | Ht 75.0 in | Wt 255.6 lb

## 2021-09-01 DIAGNOSIS — E538 Deficiency of other specified B group vitamins: Secondary | ICD-10-CM | POA: Diagnosis not present

## 2021-09-01 DIAGNOSIS — E1140 Type 2 diabetes mellitus with diabetic neuropathy, unspecified: Secondary | ICD-10-CM

## 2021-09-01 DIAGNOSIS — E785 Hyperlipidemia, unspecified: Secondary | ICD-10-CM

## 2021-09-01 DIAGNOSIS — H40023 Open angle with borderline findings, high risk, bilateral: Secondary | ICD-10-CM | POA: Diagnosis not present

## 2021-09-01 LAB — POCT GLYCOSYLATED HEMOGLOBIN (HGB A1C): Hemoglobin A1C: 5.6 % (ref 4.0–5.6)

## 2021-09-01 MED ORDER — LISINOPRIL 5 MG PO TABS: 5.0000 mg | ORAL_TABLET | Freq: Every day | ORAL | 3 refills | Status: DC

## 2021-09-01 NOTE — Patient Instructions (Addendum)
Please continue: - Metformin ER 1500 mg with dinner - B12 1000 mcg daily  Please look into Alpha Lipoic Acid 600 mg 2x a day.  Try to start Lisinopril 5 mg daily.  Please return in 6 months with your sugar log.

## 2021-09-01 NOTE — Progress Notes (Signed)
Patient ID: Terry Bonilla, male   DOB: Oct 13, 1971, 50 y.o.   MRN: [id number]   This visit occurred during the SARS-CoV-2 public health emergency.  Safety protocols were in place, including screening questions prior to the visit, additional usage of staff PPE, and extensive cleaning of exam room while observing appropriate contact time as indicated for disinfecting solutions.   HPI: Terry Bonilla is a 50 y.o.-year-old male, referred by his PCP, Dr. Ethelene Hal, for management of DM2, dx on 10/15/2019, non-insulin-dependent, uncontrolled, with long-term complications (peripheral neuropathy).  Last visit 4 months ago.  Interim history: No increased urination, blurry vision, nausea, chest pain.  He still describes some numbness and tingling in his toes. He started [employer] 02/2021 >> lost 15 lbs before last visit and sugars improved.  Afterwards, he started to diversify his diet. He gained 4 lbs afterwards.  DM2: Reviewed history: He was diagnosed with diabetes in 09/2019 after he presented to the ED with a syncopal episode.  In ED, glucose was 372 and he had glycosuria on urinalysis.  HbA1c was high.  He was started on Metformin.  Reviewed HbA1c levels: Lab Results  Component Value Date   HGBA1C 5.7 (A) 04/11/2021   HGBA1C 6.7 (A) 12/13/2020   HGBA1C 6.8 (A) 08/05/2020   HGBA1C 6.6 (A) 04/01/2020   HGBA1C 6.4 (A) 01/02/2020   HGBA1C 10.0 (H) 10/23/2019   Pt is on a regimen of:  - Metformin ER 750 mg 1x a day >> 1500 mg with dinner >> lightheadedness >> 750 mg 2x a day >> 1500 mg at dinnertime We discussed about Rybelsus 3 mg before breakfast at last OV - 07/2020, but not started yet.  He had palpitations with an instant release Metformin.  Pt checks his sugars twice a day - after starting the diet: - am: 140-155,  180, 188 >> 109-148, 163 >> 89-126, 137 >> 108-144 - 2h after b'fast: n/c - before lunch: n/c - 2h after lunch: n/c - before dinner: 87, 96-137, 151 >> 103-135, 140 >>  101-114 - 2h after dinner: n/c - bedtime: 130, 141-169, 209 >> n/c - nighttime: n/c Lowest sugar was 130 >> ...89 >> 95; it is unclear at which level he has hypoglycemia awareness. Highest sugar was 209 >> .Marland Kitchen.140 >> 145  Glucometer: [employer] brand  Feels improved after his diagnosis of diabetes: - Breakfast: [place] links >> protein bar - snack: bar - Lunch: chicken and broccoli >> lean green lunch: greens + white meat - pm: nuts >> brownie - Dinner: chicken >> lean and green dinner - Snack after dinner:bar He stopped sweet drinks.  He drinks 1-2 alcoholic drinks at night.  -No CKD, last BUN/creatinine:  Lab Results  Component Value Date   BUN 18 08/19/2021   BUN 13 01/31/2021   CREATININE 0.71 08/19/2021   CREATININE 0.84 01/31/2021  He was advised to start low-dose lisinopril 01/2021. Did not start yet.  -+ HL; last set of lipids: Lab Results  Component Value Date   CHOL 111 01/31/2021   HDL 26.50 (L) 01/31/2021   LDLCALC 53 01/31/2021   LDLDIRECT 86.0 08/19/2021   TRIG 157.0 (H) 01/31/2021   CHOLHDL 4 01/31/2021  On Lipitor 10.  - last eye exam was in 12/2020: No DR. He is on Travaprost for increased pressure.  -+ Numbness and tingling in his feet. He is seen by podiatry. He had ingrown toenail sx. X2.  Pt has FH of DM in elder brother.  He also has vitamin  B12 deficiency:  We started B12 injections.  His shooting pain in his legs improved after starting this.  We were able to transition him to 1000 mcg p.o. B12.  Reviewed his B12 levels: Lab Results  Component Value Date   VITAMINB12 741 08/19/2021   VITAMINB12 790 11/19/2020   VITAMINB12 473 08/02/2020   VITAMINB12 389 05/14/2020   VITAMINB12 196 (L) 01/30/2020   Allergic to sulfa.  She was on alfuzosin and meloxicam for prostatitis in the past.  He feels that these medications improved his dizziness.  He is now off these medications and dizziness has not recurred. He also has a history of alcohol use and  transaminitis.    ROS: + See HPI Neurological: no tremors/+ numbness in the tips of his toes/no tingling/no dizziness  I reviewed pt's medications, allergies, PMH, social hx, family hx, and changes were documented in the history of present illness. Otherwise, unchanged from my initial visit note.  Past Medical History:  Diagnosis Date   Diabetes mellitus without complication ([place]) 76/1518   pre-diabetes A1C 6.4   Sleep apnea 2009   cpap   Substance abuse ([place])    ETOH   Past Surgical History:  Procedure Laterality Date   KIDNEY SURGERY Left 08/09/2020   benign cyst laser removal    TONSILLECTOMY     age 61   Social History   Socioeconomic History   Marital status: Single    Spouse name: Not on file   Number of children: Not on file   Years of education: Not on file   Highest education level: Not on file  Occupational History   Not on file  Tobacco Use   Smoking status: Never   Smokeless tobacco: Never  Vaping Use   Vaping Use: Never used  Substance and Sexual Activity   Alcohol use: Yes    Alcohol/week: 4.0 - 6.0 standard drinks    Types: 4 - 6 Standard drinks or equivalent per week    Comment: two drinks nightly occasionally    Drug use: Never   Sexual activity: Not on file  Other Topics Concern   Not on file  Social History Narrative   Lives alone   Right handed   Caffeine: 1 cup/day   Social Determinants of Health   Financial Resource Strain: Not on file  Food Insecurity: Not on file  Transportation Needs: Not on file  Physical Activity: Not on file  Stress: Not on file  Social Connections: Not on file  Intimate Partner Violence: Not on file   Current Outpatient Medications on File Prior to Visit  Medication Sig Dispense Refill   ammonium lactate (AMLACTIN) 12 % cream Apply topically as needed for dry skin. 385 g 0   atorvastatin (LIPITOR) 10 MG tablet Take 1 tablet (10 mg total) by mouth daily. Takes at night 90 tablet 0   betamethasone  dipropionate 0.05 % cream Apply 1 application topically as needed.     Blood Glucose Calibration (ASCENSIA CONTOUR VI) by In Vitro route.     Blood Glucose Monitoring Suppl (FREESTYLE FREEDOM LITE) w/Device KIT 1 kit by Does not apply route as needed. 1 kit 0   finasteride (PROPECIA) 1 MG tablet Take 1 tablet by mouth daily.     Glucose Blood (ASCENSIA CONTOUR TEST VI) 100 each by In Vitro route 1 day or 1 dose.     Lancets (FREESTYLE) lancets USE AS DIRECTED AS NEEDED 100 each 0   latanoprost (XALATAN) 0.005 % ophthalmic solution  1 drop at bedtime.     lisinopril (ZESTRIL) 10 MG tablet Take 1 tablet (10 mg total) by mouth daily. (Patient not taking: Reported on 04/11/2021) 90 tablet 3   metFORMIN (GLUCOPHAGE-XR) 750 MG 24 hr tablet TAKE 2 TABLETS BY MOUTH WITH DINNER EVERY DAY 180 tablet 3   triamcinolone (KENALOG) 0.025 % ointment Apply 1 application topically 2 (two) times daily. Not for face or private areas. 80 g 0   Urea 40 % GEL Apply 1 application topically daily. (Patient not taking: No sig reported) 15 mL 2   vitamin B-12 (CYANOCOBALAMIN) 1000 MCG tablet Take 1,000 mcg by mouth daily.     No current facility-administered medications on file prior to visit.   Allergies  Allergen Reactions   Crab [Shellfish Allergy] Hives   Elemental Sulfur Rash   Sulfa Antibiotics Hives   PMH: -Pertinent PMH reviewed in HPI  PE: BP 138/90 (BP Location: Right Arm, Patient Position: Sitting, Cuff Size: Normal)   Pulse 85   Ht '6\' 3"'  (1.905 m)   Wt 255 lb 9.6 oz (115.9 kg)   SpO2 98%   BMI 31.95 kg/m  Wt Readings from Last 3 Encounters:  09/01/21 255 lb 9.6 oz (115.9 kg)  08/19/21 255 lb 9.6 oz (115.9 kg)  07/21/21 257 lb (116.6 kg)   Constitutional: overweight, in NAD Eyes: PERRLA, EOMI, no exophthalmos ENT: moist mucous membranes, no thyromegaly, no cervical lymphadenopathy Cardiovascular: RRR, No MRG Respiratory: CTA B Gastrointestinal: abdomen soft, NT, ND, BS+ Musculoskeletal: no  deformities, strength intact in all 4 Skin: moist, warm, no rashes Neurological: no tremor with outstretched hands, DTR normal in all 4  ASSESSMENT: 1. DM2, non-insulin-dependent, uncontrolled, with complications - PN  He does not have a family history of medullary thyroid cancer or personal history of pancreatitis.  2. HL  3.  Vitamin B12 deficiency  PLAN:  1. Patient with history of diabetes diagnosed after syncopal episode in 09/2019.  He was initially started on metformin ER, which he could not tolerate due to palpitations.  He was switched to metformin ER, which he tolerates well.  He saw nutrition and changed his diet after his diagnosis of diabetes.  He also stopped sweet drinks.  HbA1c improved to 6.7%. -At last visit, sugars were better, after starting [place].  He had hyperglycemic spikes in the 130s only with fried foods, but otherwise, sugars were all at goal.  He also lost 15 pounds in the months. We continued metformin ER.  HbA1c was 5.7%, decreased. -At last visit, he wanted to hold off starting lisinopril until his weight stabilized and see if this is absolutely needed.  We did discussed about situations in which we would start the medication: Hypertension or microalbuminuria.  At this visit, his blood pressure is high and at last visit, this was also above our target.  Therefore, at today's visit, I did suggest to add lisinopril-start with 5 mg daily. -Since last visit, he gained 4 pounds after he diversified his diet.  Since sugars in the morning are occasionally above target (he does not usually check later in the day-advised him to start), I did advise him to decrease the size of dinner and move it earlier or to include some light exercise around dinnertime.  For now, I did not suggest to change his regimen. - I suggested to:  Patient Instructions  Please continue: - Metformin ER 1500 mg with dinner - B12 1000 mcg daily  Please look into Alpha Lipoic Acid 600  mg 2x a  day.  Try to start Lisinopril 5 mg daily.  Please return in 6 months with your sugar log.   - we checked his HbA1c: 5.6% (slightly lower) - advised to check sugars at different times of the day - 1x a day, rotating check times - advised for yearly eye exams >> he is UTD - return to clinic in 6 months  2. HL -I reviewed his latest lipid panel from 01/2021: LDL at goal, transcribes slightly high, HDL low: Lab Results  Component Value Date   CHOL 111 01/31/2021   HDL 26.50 (L) 01/31/2021   LDLCALC 53 01/31/2021   LDLDIRECT 86.0 08/19/2021   TRIG 157.0 (H) 01/31/2021   CHOLHDL 4 01/31/2021  -He continues on Lipitor 10 mg daily, tolerated well  3.  Vitamin B12 deficiency -He was initially on B12 injections but transitioned to p.o. B12 1000 mcg daily -Tingling improved after starting B12 but he still has some tingling in the tips of his fingers >> I suggested alpha-lipoic acid (see above) -We reviewed together his latest B12 level, which was normal at the last check with PCP, at 741 (07/2021)  Philemon Kingdom, MD PhD [facility] Endocrinology

## 2021-09-04 ENCOUNTER — Other Ambulatory Visit: Payer: Self-pay | Admitting: Internal Medicine

## 2021-10-01 DIAGNOSIS — G4733 Obstructive sleep apnea (adult) (pediatric): Secondary | ICD-10-CM | POA: Diagnosis not present

## 2021-10-31 DIAGNOSIS — G4733 Obstructive sleep apnea (adult) (pediatric): Secondary | ICD-10-CM | POA: Diagnosis not present

## 2021-11-20 ENCOUNTER — Ambulatory Visit: Payer: BC Managed Care – PPO | Admitting: Family Medicine

## 2021-12-01 DIAGNOSIS — G4733 Obstructive sleep apnea (adult) (pediatric): Secondary | ICD-10-CM | POA: Diagnosis not present

## 2021-12-08 DIAGNOSIS — N411 Chronic prostatitis: Secondary | ICD-10-CM | POA: Diagnosis not present

## 2021-12-08 DIAGNOSIS — M545 Low back pain, unspecified: Secondary | ICD-10-CM | POA: Diagnosis not present

## 2022-01-01 DIAGNOSIS — G4733 Obstructive sleep apnea (adult) (pediatric): Secondary | ICD-10-CM | POA: Diagnosis not present

## 2022-01-05 ENCOUNTER — Ambulatory Visit: Payer: BC Managed Care – PPO | Admitting: Podiatry

## 2022-01-05 ENCOUNTER — Other Ambulatory Visit: Payer: Self-pay

## 2022-01-05 ENCOUNTER — Telehealth: Payer: Self-pay | Admitting: *Deleted

## 2022-01-05 DIAGNOSIS — L603 Nail dystrophy: Secondary | ICD-10-CM | POA: Diagnosis not present

## 2022-01-05 DIAGNOSIS — L600 Ingrowing nail: Secondary | ICD-10-CM

## 2022-01-05 DIAGNOSIS — A499 Bacterial infection, unspecified: Secondary | ICD-10-CM | POA: Diagnosis not present

## 2022-01-05 DIAGNOSIS — L608 Other nail disorders: Secondary | ICD-10-CM | POA: Diagnosis not present

## 2022-01-05 MED ORDER — CEPHALEXIN 500 MG PO CAPS: 500.0000 mg | ORAL_CAPSULE | Freq: Three times a day (TID) | ORAL | 0 refills | Status: DC

## 2022-01-05 NOTE — Telephone Encounter (Signed)
Patient is calling for status of an antibiotic that is supposed to be sent to pharmacy today. Please advise.

## 2022-01-05 NOTE — Patient Instructions (Signed)
Place 1/4 cup of epsom salts in a quart of warm tap water.  Submerge your foot or feet in the solution and soak for 20 minutes.  This soak should be done twice a day.  Next, remove your foot or feet from solution, blot dry the affected area. Apply ointment and cover if instructed by your doctor.   IF YOUR SKIN BECOMES IRRITATED WHILE USING THESE INSTRUCTIONS, IT IS OKAY TO SWITCH TO  WHITE VINEGAR AND WATER.  As another alternative soak, you may use antibacterial soap and water.  Monitor for any signs/symptoms of infection. Call the office immediately if any occur or go directly to the emergency room. Call with any questions/concerns.   Ingrown Toenail An ingrown toenail occurs when the corner or sides of a toenail grow into the surrounding skin. This causes discomfort and pain. The big toe is most commonly affected, but any of the toes can be affected. If an ingrown toenail is not treated, it can become infected. What are the causes? This condition may be caused by: Wearing shoes that are too small or tight. An injury, such as stubbing your toe or having your toe stepped on. Improper cutting or care of your toenails. Having nail or foot abnormalities that were present from birth (congenital abnormalities), such as having a nail that is too big for your toe. What increases the risk? The following factors may make you more likely to develop ingrown toenails: Age. Nails tend to get thicker with age, so ingrown nails are more common among older people. Cutting your toenails incorrectly, such as cutting them very short or cutting them unevenly. An ingrown toenail is more likely to get infected if you have: Diabetes. Blood flow (circulation) problems. What are the signs or symptoms? Symptoms of an ingrown toenail may include: Pain, soreness, or tenderness. Redness. Swelling. Hardening of the skin that surrounds the toenail. Signs that an ingrown toenail may be infected include: Fluid or  pus. Symptoms that get worse. How is this diagnosed? Ingrown toenails may be diagnosed based on: Your symptoms and medical history. A physical exam. Labs or tests. If you have fluid or blood coming from your toenail, a sample may be collected to test for the specific type of bacteria that is causing the infection. How is this treated? Treatment depends on the severity of your symptoms. You may be able to care for your toenail at home. If you have an infection, you may be prescribed antibiotic medicines. If you have fluid or pus draining from your toenail, your health care provider may drain it. If you have trouble walking, you may be given crutches to use. If you have a severe or infected ingrown toenail, you may need a procedure to remove part or all of the nail. Follow these instructions at home: [place] your wound every day for signs of infection, or as often as told by your health care provider. Check for: More redness, swelling, or pain. More fluid or blood. Warmth. Pus or a bad smell. Do not pick at your toenail or try to remove it yourself. Soak your foot in warm, soapy water. Do this for 20 minutes, 3 times a day, or as often as told by your health care provider. This helps to keep your toe clean and your skin soft. Wear shoes that fit well and are not too tight. Your health care provider may recommend that you wear open-toed shoes while you heal. Trim your toenails regularly and carefully. Cut your  toenails straight across to prevent injury to the skin at the corners of the toenail. Do not cut your nails in a curved shape. Keep your feet clean and dry to help prevent infection. General instructions Take over-the-counter and prescription medicines only as told by your health care provider. If you were prescribed an antibiotic, take it as told by your health care provider. Do not stop taking the antibiotic even if you start to feel better. If your health care provider  told you to use crutches to help you move around, use them as instructed. Return to your normal activities as told by your health care provider. Ask your health care provider what activities are safe for you. Keep all follow-up visits. This is important. Contact a health care provider if: You have more redness, swelling, pain, or other symptoms that do not improve with treatment. You have fluid, blood, or pus coming from your toenail. You have a red streak on your skin that starts at your foot and spreads up your leg. You have a fever. Summary An ingrown toenail occurs when the corner or sides of a toenail grow into the surrounding skin. This causes discomfort and pain. The big toe is most commonly affected, but any of the toes can be affected. If an ingrown toenail is not treated, it can become infected. Fluid or pus draining from your toenail is a sign of infection. Your health care provider may need to drain it. You may be given antibiotics to treat the infection. Trimming your toenails regularly and properly can help you prevent an ingrown toenail. This information is not intended to replace advice given to you by your health care provider. Make sure you discuss any questions you have with your health care provider. Document Revised: 03/11/2021 Document Reviewed: 03/11/2021 Elsevier Patient Education  [place].

## 2022-01-07 NOTE — Progress Notes (Signed)
Subjective: 51 year old male presents to the office today for concerns of recurrent ingrown toenail right big toe, lateral aspect.  Started about 2 to 3 weeks ago.  Areas been tender but not like it has been previously.  No drainage or pus.  Objective: AAO x3, NAD DP/PT pulses palpable bilaterally, CRT less than 3 seconds Incurvation present to lateral aspect right hallux toenail localized edema and mild erythema on the proximal nail fold likely more from inflammation as opposed to infection there is no drainage or pus or ascending cellulitis.  No signs of abscess.  No other area discomfort.  No significant incurvation on the medial nail fold. No pain with calf compression, swelling, warmth, erythema  Assessment: Right lateral hallux ingrown toenail   Plan: -All treatment options discussed with the patient including all alternatives, risks, complications.  -At this time, the patient is requesting partial nail removal with chemical matricectomy to the symptomatic portion of the nail. Risks and complications were discussed with the patient for which they understand and written consent was obtained. Under sterile conditions a total of 3 mL of a mixture of 2% lidocaine plain and 0.5% Marcaine plain was infiltrated in a hallux block fashion. Once anesthetized, the skin was prepped in sterile fashion. A tourniquet was then applied. Next the lateral aspect of hallux nail border was then sharply excised making sure to remove the entire offending nail border. Once the nails were ensured to be removed area was debrided and the underlying skin was intact. There is no purulence identified in the procedure. Next phenol was then applied under standard conditions and copiously irrigated. Silvadene was applied. A dry sterile dressing was applied. After application of the dressing the tourniquet was removed and there is found to be an immediate capillary refill time to the digit. The patient tolerated the procedure  well any complications. Post procedure instructions were discussed the patient for which he verbally understood. Follow-up in one week for nail check or sooner if any problems are to arise. Discussed signs/symptoms of infection and directed to call the office immediately should any occur or go directly to the emergency room. In the meantime, encouraged to call the office with any questions, concerns, changes symptoms. -Keflex -Patient encouraged to call the office with any questions, concerns, change in symptoms.   Trula Slade DPM

## 2022-01-08 NOTE — Telephone Encounter (Signed)
Patient has been notified thru vmessage

## 2022-01-15 ENCOUNTER — Other Ambulatory Visit: Payer: Self-pay | Admitting: Family Medicine

## 2022-01-15 DIAGNOSIS — E7800 Pure hypercholesterolemia, unspecified: Secondary | ICD-10-CM

## 2022-01-16 ENCOUNTER — Encounter: Payer: Self-pay | Admitting: Podiatry

## 2022-01-19 ENCOUNTER — Ambulatory Visit: Payer: BC Managed Care – PPO | Admitting: Podiatry

## 2022-01-19 LAB — HM DIABETES EYE EXAM

## 2022-01-29 DIAGNOSIS — G4733 Obstructive sleep apnea (adult) (pediatric): Secondary | ICD-10-CM | POA: Diagnosis not present

## 2022-02-04 ENCOUNTER — Ambulatory Visit: Payer: BC Managed Care – PPO | Admitting: Family Medicine

## 2022-02-12 ENCOUNTER — Other Ambulatory Visit: Payer: Self-pay

## 2022-02-12 ENCOUNTER — Ambulatory Visit: Payer: BC Managed Care – PPO | Admitting: Family Medicine

## 2022-02-12 ENCOUNTER — Encounter: Payer: Self-pay | Admitting: Family Medicine

## 2022-02-12 VITALS — BP 122/78 | HR 93 | Temp 97.0°F | Ht 75.0 in | Wt 262.0 lb

## 2022-02-12 DIAGNOSIS — E1140 Type 2 diabetes mellitus with diabetic neuropathy, unspecified: Secondary | ICD-10-CM

## 2022-02-12 DIAGNOSIS — E7800 Pure hypercholesterolemia, unspecified: Secondary | ICD-10-CM

## 2022-02-12 DIAGNOSIS — Z1159 Encounter for screening for other viral diseases: Secondary | ICD-10-CM | POA: Diagnosis not present

## 2022-02-12 DIAGNOSIS — G629 Polyneuropathy, unspecified: Secondary | ICD-10-CM | POA: Diagnosis not present

## 2022-02-12 DIAGNOSIS — E66811 Obesity, class 1: Secondary | ICD-10-CM

## 2022-02-12 DIAGNOSIS — E669 Obesity, unspecified: Secondary | ICD-10-CM

## 2022-02-12 DIAGNOSIS — E538 Deficiency of other specified B group vitamins: Secondary | ICD-10-CM | POA: Diagnosis not present

## 2022-02-12 LAB — URINALYSIS, ROUTINE W REFLEX MICROSCOPIC
Bilirubin Urine: NEGATIVE
Ketones, ur: NEGATIVE
Leukocytes,Ua: NEGATIVE
Nitrite: NEGATIVE
Specific Gravity, Urine: 1.015 (ref 1.000–1.030)
Total Protein, Urine: NEGATIVE
Urine Glucose: NEGATIVE
Urobilinogen, UA: 0.2 (ref 0.0–1.0)
pH: 6 (ref 5.0–8.0)

## 2022-02-12 LAB — BASIC METABOLIC PANEL
BUN: 17 mg/dL (ref 6–23)
CO2: 27 mEq/L (ref 19–32)
Calcium: 9 mg/dL (ref 8.4–10.5)
Chloride: 103 mEq/L (ref 96–112)
Creatinine, Ser: 0.8 mg/dL (ref 0.40–1.50)
GFR: 101.48 mL/min (ref >60.00)
Glucose, Bld: 133 mg/dL — ABNORMAL HIGH (ref 70–99)
Potassium: 4 mEq/L (ref 3.5–5.1)
Sodium: 137 mEq/L (ref 135–145)

## 2022-02-12 LAB — LIPID PANEL
Cholesterol: 111 mg/dL (ref 0–200)
HDL: 27.3 mg/dL — ABNORMAL LOW (ref >39.00)
LDL Cholesterol: 61 mg/dL (ref 0–99)
NonHDL: 83.66
Total CHOL/HDL Ratio: 4
Triglycerides: 113 mg/dL (ref 0.0–149.0)
VLDL: 22.6 mg/dL (ref 0.0–40.0)

## 2022-02-12 LAB — MICROALBUMIN / CREATININE URINE RATIO
Creatinine,U: 115.6 mg/dL
Microalb Creat Ratio: 0.7 mg/g (ref 0.0–30.0)
Microalb, Ur: 0.8 mg/dL (ref 0.0–1.9)

## 2022-02-12 LAB — CBC
HCT: 43.9 % (ref 39.0–52.0)
Hemoglobin: 15.4 g/dL (ref 13.0–17.0)
MCHC: 35.2 g/dL (ref 30.0–36.0)
MCV: 91.2 fl (ref 78.0–100.0)
Platelets: 146 10*3/uL — ABNORMAL LOW (ref 150.0–400.0)
RBC: 4.81 Mil/uL (ref 4.22–5.81)
RDW: 12.5 % (ref 11.5–15.5)
WBC: 4.3 10*3/uL (ref 4.0–10.5)

## 2022-02-12 LAB — TSH: TSH: 3.54 u[IU]/mL (ref 0.35–5.50)

## 2022-02-12 LAB — VITAMIN B12: Vitamin B-12: 888 pg/mL (ref 211–911)

## 2022-02-12 NOTE — Progress Notes (Signed)
? ?Established Patient Office Visit ? ?Subjective:  ?Patient ID: Terry Bonilla, male    DOB: [birth year]  Age: 51 y.o. MRN: [id number] ? ?CC:  ?Chief Complaint  ?Patient presents with  ? Follow-up  ?  3 month follow up, no concerns. Patient fasting.   ? ? ?HPI ?Terry Bonilla presents for follow-up of elevated blood pressure, B12 deficiency, elevated ldl cholesterol and neuropathy.  Mild neuropathy medial forefeet.  Denies any burning or stinging pain.  Blood pressure well controlled after starting low-dose lisinopril.  Continues low-dose atorvastatin.  Seeing urology for prostate checks.  ? ?Past Medical History:  ?Diagnosis Date  ? Diabetes mellitus without complication ([place]) 22/2979  ? pre-diabetes A1C 6.4  ? Sleep apnea 2009  ? cpap  ? Substance abuse ([place])   ? ETOH  ? ? ?Past Surgical History:  ?Procedure Laterality Date  ? KIDNEY SURGERY Left 08/09/2020  ? benign cyst laser removal   ? TONSILLECTOMY    ? age 2  ? ? ?Family History  ?Problem Relation Age of Onset  ? Ovarian cancer Mother   ? Arrhythmia Father   ? Diabetes type II Brother   ? Colon cancer Neg Hx   ? Colon polyps Neg Hx   ? Esophageal cancer Neg Hx   ? Rectal cancer Neg Hx   ? Stomach cancer Neg Hx   ? ? ?Social History  ? ?Socioeconomic History  ? Marital status: Single  ?  Spouse name: Not on file  ? Number of children: Not on file  ? Years of education: Not on file  ? Highest education level: Not on file  ?Occupational History  ? Not on file  ?Tobacco Use  ? Smoking status: Never  ? Smokeless tobacco: Never  ?Vaping Use  ? Vaping Use: Never used  ?Substance and Sexual Activity  ? Alcohol use: Yes  ?  Alcohol/week: 4.0 - 6.0 standard drinks  ?  Types: 4 - 6 Standard drinks or equivalent per week  ?  Comment: two drinks nightly occasionally   ? Drug use: Never  ? Sexual activity: Not on file  ?Other Topics Concern  ? Not on file  ?Social History Narrative  ? Lives alone  ? Right handed  ? Caffeine: 1 cup/day  ? ?Social Determinants of Health   ? ?Financial Resource Strain: Not on file  ?Food Insecurity: Not on file  ?Transportation Needs: Not on file  ?Physical Activity: Not on file  ?Stress: Not on file  ?Social Connections: Not on file  ?Intimate Partner Violence: Not on file  ? ? ?Outpatient Medications Prior to Visit  ?Medication Sig Dispense Refill  ? ammonium lactate (AMLACTIN) 12 % cream Apply topically as needed for dry skin. 385 g 0  ? atorvastatin (LIPITOR) 10 MG tablet TAKE 1 TABLET EVERY DAY IN THE EVENING 90 tablet 0  ? betamethasone dipropionate 0.05 % cream Apply 1 application topically as needed.    ? Blood Glucose Calibration (ASCENSIA CONTOUR VI) by In Vitro route.    ? Blood Glucose Monitoring Suppl (FREESTYLE FREEDOM LITE) w/Device KIT 1 kit by Does not apply route as needed. 1 kit 0  ? finasteride (PROPECIA) 1 MG tablet Take 1 tablet by mouth daily.    ? Glucose Blood (ASCENSIA CONTOUR TEST VI) 100 each by In Vitro route 1 day or 1 dose.    ? Lancets (FREESTYLE) lancets USE AS DIRECTED AS NEEDED 100 each 0  ? latanoprost (XALATAN) 0.005 % ophthalmic solution 1  drop at bedtime.    ? lisinopril (ZESTRIL) 5 MG tablet Take 1 tablet (5 mg total) by mouth daily. 90 tablet 3  ? metFORMIN (GLUCOPHAGE-XR) 750 MG 24 hr tablet TAKE 2 TABLETS BY MOUTH WITH DINNER EVERY DAY 180 tablet 3  ? triamcinolone (KENALOG) 0.025 % ointment Apply 1 application topically 2 (two) times daily. Not for face or private areas. 80 g 0  ? vitamin B-12 (CYANOCOBALAMIN) 1000 MCG tablet Take 1,000 mcg by mouth daily.    ? Urea 40 % GEL Apply 1 application topically daily. 15 mL 2  ? cephALEXin (KEFLEX) 500 MG capsule Take 1 capsule (500 mg total) by mouth 3 (three) times daily. 21 capsule 0  ? ?No facility-administered medications prior to visit.  ? ? ?Allergies  ?Allergen Reactions  ? Crab [Shellfish Allergy] Hives  ? Elemental Sulfur Rash  ? Sulfa Antibiotics Hives  ? ? ?ROS ?Review of Systems  ?Constitutional:  Negative for diaphoresis, fatigue, fever and  unexpected weight change.  ?HENT: Negative.    ?Eyes:  Negative for photophobia and visual disturbance.  ?Respiratory: Negative.    ?Cardiovascular: Negative.   ?Gastrointestinal: Negative.   ?Genitourinary:  Negative for difficulty urinating, frequency and urgency.  ?Musculoskeletal:  Negative for gait problem and joint swelling.  ?Neurological:  Negative for speech difficulty, weakness and light-headedness.  ? ?  ?Objective:  ?  ?Physical Exam ?Vitals and nursing note reviewed.  ?Constitutional:   ?   General: He is not in acute distress. ?   Appearance: Normal appearance. He is not ill-appearing, toxic-appearing or diaphoretic.  ?HENT:  ?   Head: Normocephalic and atraumatic.  ?   Right Ear: Tympanic membrane, ear canal and external ear normal.  ?   Left Ear: Tympanic membrane, ear canal and external ear normal.  ?   Mouth/Throat:  ?   Mouth: Mucous membranes are moist.  ?   Pharynx: Oropharynx is clear. No oropharyngeal exudate or posterior oropharyngeal erythema.  ?Eyes:  ?   General: No scleral icterus.    ?   Right eye: No discharge.     ?   Left eye: No discharge.  ?   Extraocular Movements: Extraocular movements intact.  ?   Conjunctiva/sclera: Conjunctivae normal.  ?   Pupils: Pupils are equal, round, and reactive to light.  ?Cardiovascular:  ?   Rate and Rhythm: Normal rate and regular rhythm.  ?Pulmonary:  ?   Effort: Pulmonary effort is normal.  ?   Breath sounds: Normal breath sounds.  ?Musculoskeletal:  ?   Cervical back: No rigidity or tenderness.  ?   Right lower leg: No edema.  ?   Left lower leg: No edema.  ?Lymphadenopathy:  ?   Cervical: No cervical adenopathy.  ?Skin: ?   General: Skin is warm and dry.  ?Neurological:  ?   Mental Status: He is alert and oriented to person, place, and time.  ?Psychiatric:     ?   Mood and Affect: Mood normal.     ?   Behavior: Behavior normal.  ? ? ?BP 122/78 (BP Location: Right Arm, Patient Position: Sitting, Cuff Size: Large)   Pulse 93   Temp (!) 97 ?F  (36.1 ?C) (Temporal)   Ht _0  (1.905 m)   Wt 262 lb (118.8 kg)   SpO2 98%   BMI 32.75 kg/m?  ?Wt Readings from Last 3 Encounters:  ?02/12/22 262 lb (118.8 kg)  ?09/01/21 255 lb 9.6 oz (115.9 kg)  ?08/19/21  255 lb 9.6 oz (115.9 kg)  ? ? ? ?Health Maintenance Due  ?Topic Date Due  ? Hepatitis C Screening  Never done  ? ? ?There are no preventive care reminders to display for this patient. ? ?Lab Results  ?Component Value Date  ? TSH 1.420 11/27/2019  ? ?Lab Results  ?Component Value Date  ? WBC 6.0 08/19/2021  ? HGB 15.9 08/19/2021  ? HCT 45.1 08/19/2021  ? MCV 88.8 08/19/2021  ? PLT 153.0 08/19/2021  ? ?Lab Results  ?Component Value Date  ? NA 137 08/19/2021  ? K 3.8 08/19/2021  ? CO2 24 08/19/2021  ? GLUCOSE 114 (H) 08/19/2021  ? BUN 18 08/19/2021  ? CREATININE 0.71 08/19/2021  ? BILITOT 0.8 08/19/2021  ? ALKPHOS 68 08/19/2021  ? AST 21 08/19/2021  ? ALT 31 08/19/2021  ? PROT 7.2 08/19/2021  ? ALBUMIN 4.8 08/19/2021  ? CALCIUM 9.6 08/19/2021  ? ANIONGAP 9 10/18/2019  ? GFR 105.56 08/19/2021  ? ?Lab Results  ?Component Value Date  ? CHOL 111 01/31/2021  ? ?Lab Results  ?Component Value Date  ? HDL 26.50 (L) 01/31/2021  ? ?Lab Results  ?Component Value Date  ? LDLCALC 53 01/31/2021  ? ?Lab Results  ?Component Value Date  ? TRIG 157.0 (H) 01/31/2021  ? ?Lab Results  ?Component Value Date  ? CHOLHDL 4 01/31/2021  ? ?Lab Results  ?Component Value Date  ? HGBA1C 5.6 09/01/2021  ? ? ?  ?Assessment & Plan:  ? ?Problem List Items Addressed This Visit   ? ?  ? Endocrine  ? Type 2 diabetes mellitus with diabetic neuropathy, without long-term current use of insulin ([place])  ? Relevant Orders  ? Basic metabolic panel  ? CBC  ? Urinalysis, Routine w reflex microscopic  ? Microalbumin / creatinine urine ratio  ?  ? Nervous and Auditory  ? Neuropathy  ? Relevant Orders  ? TSH  ?  ? Other  ? Elevated LDL cholesterol level - Primary  ? Relevant Orders  ? Lipid panel  ? B12 deficiency  ? Relevant Orders  ? Vitamin B12  ? Obesity  (BMI 30.0-34.9)  ? Encounter for hepatitis C screening test for low risk patient  ? Relevant Orders  ? Hepatitis C antibody  ? ? ?No orders of the defined types were placed in this encounter. ? ? ?Follow-up: Return in about 6

## 2022-02-13 LAB — HEPATITIS C ANTIBODY
Hepatitis C Ab: NONREACTIVE
SIGNAL TO CUT-OFF: 0.05 (ref <1.00)

## 2022-03-01 DIAGNOSIS — G4733 Obstructive sleep apnea (adult) (pediatric): Secondary | ICD-10-CM | POA: Diagnosis not present

## 2022-03-06 ENCOUNTER — Ambulatory Visit: Payer: BC Managed Care – PPO | Admitting: Internal Medicine

## 2022-03-06 ENCOUNTER — Encounter: Payer: Self-pay | Admitting: Internal Medicine

## 2022-03-06 VITALS — BP 122/72 | HR 85 | Ht 75.0 in | Wt 260.8 lb

## 2022-03-06 DIAGNOSIS — E785 Hyperlipidemia, unspecified: Secondary | ICD-10-CM | POA: Diagnosis not present

## 2022-03-06 DIAGNOSIS — E538 Deficiency of other specified B group vitamins: Secondary | ICD-10-CM

## 2022-03-06 DIAGNOSIS — E1140 Type 2 diabetes mellitus with diabetic neuropathy, unspecified: Secondary | ICD-10-CM

## 2022-03-06 NOTE — Progress Notes (Signed)
Patient ID: Terry Bonilla, male   DOB: July 15, 1971, 51 y.o.   MRN: [id number]  ? ?This visit occurred during the SARS-CoV-2 public health emergency.  Safety protocols were in place, including screening questions prior to the visit, additional usage of staff PPE, and extensive cleaning of exam room while observing appropriate contact time as indicated for disinfecting solutions.  ? ?HPI: ?Terry Bonilla is a 51 y.o.-year-old male, referred by his PCP, Dr. Ethelene Hal, for management of DM2, dx on 10/15/2019, non-insulin-dependent, uncontrolled, with long-term complications (peripheral neuropathy).  Last visit 6 months ago. ? ?Interim history: ?No increased urination, blurry vision, nausea, chest pain.   ?He has some numbness and tingling in his toes, which is not new.  He tried alpha-lipoic acid for 1.5 to 2 months but he did not feel that this worked well for him. ?He gained 5 pounds since last visit. ? ?DM2: ?Reviewed history: ?He was diagnosed with diabetes in 09/2019 after he presented to the ED with a syncopal episode.  In ED, glucose was 372 and he had glycosuria on urinalysis.  HbA1c was high.  He was started on Metformin. ? ?Reviewed HbA1c levels: ?Lab Results  ?Component Value Date  ? HGBA1C 5.6 09/01/2021  ? HGBA1C 5.7 (A) 04/11/2021  ? HGBA1C 6.7 (A) 12/13/2020  ? HGBA1C 6.8 (A) 08/05/2020  ? HGBA1C 6.6 (A) 04/01/2020  ? HGBA1C 6.4 (A) 01/02/2020  ? HGBA1C 10.0 (H) 10/23/2019  ? ?Pt is on a regimen of:  ?- Metformin ER 750 mg 1x a day >> 1500 mg with dinner >> lightheadedness >> 750 mg 2x a day >> 1500 mg at dinnertime ?We discussed about Rybelsus 3 mg before breakfast at last OV - 07/2020, but not started yet.  ?He had palpitations with an instant release Metformin. ? ?Pt checks his sugars twice a day - after starting the diet: ?- am: 109-148, 163 >> 89-126, 137 >> 108-144 >> 112-139, 144 ?- 2h after b'fast: n/c ?- before lunch: n/c ?- 2h after lunch: n/c ?- before dinner:  103-135, 140 >> 101-114 >> n/c ?- 2h  after dinner: n/c ?- bedtime: 130, 141-169, 209 >> n/c ?- nighttime: n/c ?Lowest sugar was 130 >> ...89 >> 95 >> 91; it is unclear at which level he has hypoglycemia awareness. ?Highest sugar was 209 >> .Marland Kitchen.140 >> 145 >> 150 (doughnut). ? ?Glucometer: [facility] brand ? ?Feels improved after his diagnosis of diabetes: ?- Breakfast: [place] links >> protein bar ?- snack: bar ?- Lunch: chicken and broccoli >> lean green lunch: greens + white meat ?- pm: nuts >> brownie ?- Dinner: chicken >> lean and green dinner ?- Snack after dinner:bar ?He stopped sweet drinks.  He drinks 1-2 alcoholic drinks at night. ? ?-No CKD, last BUN/creatinine:  ?Lab Results  ?Component Value Date  ? BUN 17 02/12/2022  ? BUN 18 08/19/2021  ? CREATININE 0.80 02/12/2022  ? CREATININE 0.71 08/19/2021  ?We started lisinopril 5 mg daily in 08/2021. ? ?-+ HL; last set of lipids: ?Lab Results  ?Component Value Date  ? CHOL 111 02/12/2022  ? HDL 27.30 (L) 02/12/2022  ? [place] 61 02/12/2022  ? LDLDIRECT 86.0 08/19/2021  ? TRIG 113.0 02/12/2022  ? CHOLHDL 4 02/12/2022  ?On Lipitor 10. ? ?- last eye exam was in 01/2022: No DR reortedly (Dr. Macarthur Critchley). He is on Travaprost for increased pressure. ? ?-+ Numbness and tingling in his feet. He is seen by podiatry. He had ingrown toenail sx. X2.  He saw Dr.  Wagoner 01/05/2022.  He had onychodystrophy, ingrown toenails. ? ?Pt has FH of DM in elder brother. ? ?He also has vitamin B12 deficiency: ? ?We started B12 injections.  His shooting pain in his legs improved after starting this.  We were able to transition him to 1000 mcg p.o. B12. ? ?Reviewed his B12 levels: ?Lab Results  ?Component Value Date  ? YYFRTMYT11 888 02/12/2022  ? NBVAPOLI10 741 08/19/2021  ? [id number] 790 11/19/2020  ? [id number] 473 08/02/2020  ? [id number] 389 05/14/2020  ? VITAMINB12 196 (L) 01/30/2020  ? ?Allergic to sulfa.  ?She was on alfuzosin and meloxicam for prostatitis in the past.  He feels that these medications improved his dizziness.   He is now off these medications and dizziness has not recurred. ?He also has a history of alcohol use and transaminitis.  Latest LFTs were normal in 07/2021. ? ?ROS: ?+ See HPI ?Neurological: no tremors/+ numbness in the tips of his toes/no tingling/no dizziness ? ?I reviewed pt's medications, allergies, PMH, social hx, family hx, and changes were documented in the history of present illness. Otherwise, unchanged from my initial visit note. ? ?Past Medical History:  ?Diagnosis Date  ? Diabetes mellitus without complication ([place]) 32/7614  ? pre-diabetes A1C 6.4  ? Sleep apnea 2009  ? cpap  ? Substance abuse ([place])   ? ETOH  ? ?Past Surgical History:  ?Procedure Laterality Date  ? KIDNEY SURGERY Left 08/09/2020  ? benign cyst laser removal   ? TONSILLECTOMY    ? age 61  ? ?Social History  ? ?Socioeconomic History  ? Marital status: Single  ?  Spouse name: Not on file  ? Number of children: Not on file  ? Years of education: Not on file  ? Highest education level: Not on file  ?Occupational History  ? Not on file  ?Tobacco Use  ? Smoking status: Never  ? Smokeless tobacco: Never  ?Vaping Use  ? Vaping Use: Never used  ?Substance and Sexual Activity  ? Alcohol use: Yes  ?  Alcohol/week: 4.0 - 6.0 standard drinks  ?  Types: 4 - 6 Standard drinks or equivalent per week  ?  Comment: two drinks nightly occasionally   ? Drug use: Never  ? Sexual activity: Not on file  ?Other Topics Concern  ? Not on file  ?Social History Narrative  ? Lives alone  ? Right handed  ? Caffeine: 1 cup/day  ? ?Social Determinants of Health  ? ?Financial Resource Strain: Not on file  ?Food Insecurity: Not on file  ?Transportation Needs: Not on file  ?Physical Activity: Not on file  ?Stress: Not on file  ?Social Connections: Not on file  ?Intimate Partner Violence: Not on file  ? ?Current Outpatient Medications on File Prior to Visit  ?Medication Sig Dispense Refill  ? ammonium lactate (AMLACTIN) 12 % cream Apply topically as needed for dry skin. 385  g 0  ? atorvastatin (LIPITOR) 10 MG tablet TAKE 1 TABLET EVERY DAY IN THE EVENING 90 tablet 0  ? betamethasone dipropionate 0.05 % cream Apply 1 application topically as needed.    ? Blood Glucose Calibration (ASCENSIA CONTOUR VI) by In Vitro route.    ? Blood Glucose Monitoring Suppl (FREESTYLE FREEDOM LITE) w/Device KIT 1 kit by Does not apply route as needed. 1 kit 0  ? finasteride (PROPECIA) 1 MG tablet Take 1 tablet by mouth daily.    ? Glucose Blood (ASCENSIA CONTOUR TEST VI) 100 each by In Vitro route  1 day or 1 dose.    ? Lancets (FREESTYLE) lancets USE AS DIRECTED AS NEEDED 100 each 0  ? latanoprost (XALATAN) 0.005 % ophthalmic solution 1 drop at bedtime.    ? lisinopril (ZESTRIL) 5 MG tablet Take 1 tablet (5 mg total) by mouth daily. 90 tablet 3  ? metFORMIN (GLUCOPHAGE-XR) 750 MG 24 hr tablet TAKE 2 TABLETS BY MOUTH WITH DINNER EVERY DAY 180 tablet 3  ? triamcinolone (KENALOG) 0.025 % ointment Apply 1 application topically 2 (two) times daily. Not for face or private areas. 80 g 0  ? vitamin B-12 (CYANOCOBALAMIN) 1000 MCG tablet Take 1,000 mcg by mouth daily.    ? ?No current facility-administered medications on file prior to visit.  ? ?Allergies  ?Allergen Reactions  ? Crab [Shellfish Allergy] Hives  ? Elemental Sulfur Rash  ? Sulfa Antibiotics Hives  ? ?PMH: ?-Pertinent PMH reviewed in HPI ? ?PE: ?BP 122/72 (BP Location: Right Arm, Patient Position: Sitting, Cuff Size: Normal)   Pulse 85   Ht '6\' 3"'  (1.905 m)   Wt 260 lb 12.8 oz (118.3 kg)   SpO2 99%   BMI 32.60 kg/m?  ?Wt Readings from Last 3 Encounters:  ?03/06/22 260 lb 12.8 oz (118.3 kg)  ?02/12/22 262 lb (118.8 kg)  ?09/01/21 255 lb 9.6 oz (115.9 kg)  ? ?Constitutional: overweight, in NAD ?Eyes: PERRLA, EOMI, no exophthalmos ?ENT: moist mucous membranes, no thyromegaly, no cervical lymphadenopathy ?Cardiovascular: RRR, No MRG ?Respiratory: CTA B ?Musculoskeletal: no deformities, strength intact in all 4 ?Skin: moist, warm, no  rashes ?Neurological: no tremor with outstretched hands, DTR normal in all 4 ? ?ASSESSMENT: ?1. DM2, non-insulin-dependent, uncontrolled, with complications ?- PN ? ?He does not have a family history of medullary thyroid canc

## 2022-03-06 NOTE — Patient Instructions (Addendum)
Please continue: ?- Metformin ER 1500 mg with dinner ? ?Also, continue: ?- Vitamin B12 1000 mcg daily ?- Lisinopril 5 mg daily ? ?Try to alternate blood sugar checks. ? ?You can try them for thiamine for peripheral neuropathy. ? ?Please return in 6 months with your sugar log.  ?

## 2022-03-11 ENCOUNTER — Telehealth: Payer: Self-pay

## 2022-03-11 NOTE — Telephone Encounter (Signed)
Pt called regarding mediation recommendation for neuropathy. Pt unsure of name, strength. and daily dosage.  ?

## 2022-03-11 NOTE — Telephone Encounter (Signed)
T, ?Benfotiamine 300 mg 2x a day. ?Ty! ?C ?

## 2022-03-13 NOTE — Telephone Encounter (Signed)
Pt advised of recommendation via Mychart. ?

## 2022-03-31 DIAGNOSIS — G4733 Obstructive sleep apnea (adult) (pediatric): Secondary | ICD-10-CM | POA: Diagnosis not present

## 2022-04-18 ENCOUNTER — Other Ambulatory Visit: Payer: Self-pay | Admitting: Family Medicine

## 2022-04-18 DIAGNOSIS — E7800 Pure hypercholesterolemia, unspecified: Secondary | ICD-10-CM

## 2022-05-01 DIAGNOSIS — G4733 Obstructive sleep apnea (adult) (pediatric): Secondary | ICD-10-CM | POA: Diagnosis not present

## 2022-05-31 DIAGNOSIS — G4733 Obstructive sleep apnea (adult) (pediatric): Secondary | ICD-10-CM | POA: Diagnosis not present

## 2022-06-01 DIAGNOSIS — H401131 Primary open-angle glaucoma, bilateral, mild stage: Secondary | ICD-10-CM | POA: Diagnosis not present

## 2022-07-01 DIAGNOSIS — G4733 Obstructive sleep apnea (adult) (pediatric): Secondary | ICD-10-CM | POA: Diagnosis not present

## 2022-07-16 NOTE — Progress Notes (Unsigned)
[facility] [place], [place] [address]  Pulmonary Sleep Medicine   Office Visit Note  Patient Name: Terry Bonilla DOB: 12-11-1970 MRN [id number]    Chief Complaint: Obstructive Sleep Apnea visit  Brief History:  Terry Bonilla is seen today for an annual follow up on CPAP at 10 cmh20.  The patient has a 6 year history of sleep apnea. Patient is using PAP nightly.  The patient feels rested after sleeping with PAP.  The patient reports benefiting from PAP use. Reported sleepiness is improved and the Epworth Sleepiness Score is 0 out of 24. The patient does not take naps. The patient complains of the following: No complaints.  The compliance download shows 100% compliance with an average use time of 8 hours 38 minutes. The AHI is 3.6  The patient does not complain of limb movements disrupting sleep.  ROS  General: (-) fever, (-) chills, (-) night sweat Nose and Sinuses: (-) nasal stuffiness or itchiness, (-) postnasal drip, (-) nosebleeds, (-) sinus trouble. Mouth and Throat: (-) sore throat, (-) hoarseness. Neck: (-) swollen glands, (-) enlarged thyroid, (-) neck pain. Respiratory: - cough, - shortness of breath, - wheezing. Neurologic: - numbness, - tingling. Psychiatric: - anxiety, - depression   Current Medication: Outpatient Encounter Medications as of 07/20/2022  Medication Sig Note   ammonium lactate (AMLACTIN) 12 % cream Apply topically as needed for dry skin. 08/02/2020: prn   atorvastatin (LIPITOR) 10 MG tablet TAKE 1 TABLET BY MOUTH EVERY DAY IN THE EVENING    betamethasone dipropionate 0.05 % cream Apply 1 application topically as needed. 08/02/2020: prn   Blood Glucose Calibration (ASCENSIA CONTOUR VI) by In Vitro route.    Blood Glucose Monitoring Suppl (FREESTYLE FREEDOM LITE) w/Device KIT 1 kit by Does not apply route as needed.    finasteride (PROPECIA) 1 MG tablet Take 1 tablet by mouth daily.    Glucose Blood (ASCENSIA CONTOUR TEST VI) 100 each  by In Vitro route 1 day or 1 dose.    Lancets (FREESTYLE) lancets USE AS DIRECTED AS NEEDED    latanoprost (XALATAN) 0.005 % ophthalmic solution 1 drop at bedtime.    lisinopril (ZESTRIL) 5 MG tablet Take 1 tablet (5 mg total) by mouth daily.    metFORMIN (GLUCOPHAGE-XR) 750 MG 24 hr tablet TAKE 2 TABLETS BY MOUTH WITH DINNER EVERY DAY    triamcinolone (KENALOG) 0.025 % ointment Apply 1 application topically 2 (two) times daily. Not for face or private areas.    vitamin B-12 (CYANOCOBALAMIN) 1000 MCG tablet Take 1,000 mcg by mouth daily.    No facility-administered encounter medications on file as of 07/20/2022.    Surgical History: Past Surgical History:  Procedure Laterality Date   KIDNEY SURGERY Left 08/09/2020   benign cyst laser removal    TONSILLECTOMY     age 51    Medical History: Past Medical History:  Diagnosis Date   Diabetes mellitus without complication ([place]) 25/6389   pre-diabetes A1C 6.4   Sleep apnea 2009   cpap   Substance abuse ([place])    ETOH    Family History: Non contributory to the present illness  Social History: Social History   Socioeconomic History   Marital status: Single    Spouse name: Not on file   Number of children: Not on file   Years of education: Not on file   Highest education level: Not on file  Occupational History   Not on file  Tobacco Use   Smoking  status: Never   Smokeless tobacco: Never  Vaping Use   Vaping Use: Never used  Substance and Sexual Activity   Alcohol use: Yes    Alcohol/week: 4.0 - 6.0 standard drinks of alcohol    Types: 4 - 6 Standard drinks or equivalent per week    Comment: two drinks nightly occasionally    Drug use: Never   Sexual activity: Not on file  Other Topics Concern   Not on file  Social History Narrative   Lives alone   Right handed   Caffeine: 1 cup/day   Social Determinants of Health   Financial Resource Strain: Not on file  Food Insecurity: Not on file  Transportation Needs: Not  on file  Physical Activity: Not on file  Stress: Not on file  Social Connections: Not on file  Intimate Partner Violence: Not on file    Vital Signs: There were no vitals taken for this visit. There is no height or weight on file to calculate BMI.    Examination: General Appearance: The patient is well-developed, well-nourished, and in no distress. Neck Circumference: 51 cm Skin: Gross inspection of skin unremarkable. Head: normocephalic, no gross deformities. Eyes: no gross deformities noted. ENT: ears appear grossly normal Neurologic: Alert and oriented. No involuntary movements.  STOP BANG RISK ASSESSMENT S (snore) Have you been told that you snore?     NO   T (tired) Are you often tired, fatigued, or sleepy during the day?   NO  O (obstruction) Do you stop breathing, choke, or gasp during sleep? NO   P (pressure) Do you have or are you being treated for high blood pressure? YES   B (BMI) Is your body index greater than 35 kg/m? NO   A (age) Are you 53 years old or older? YES   N (neck) Do you have a neck circumference greater than 16 inches?   YES   G (gender) Are you a male? YES   TOTAL STOP/BANG "YES" ANSWERS 4       A STOP-Bang score of 2 or less is considered low risk, and a score of 5 or more is high risk for having either moderate or severe OSA. For people who score 3 or 4, doctors may need to perform further assessment to determine how likely they are to have OSA.         EPWORTH SLEEPINESS SCALE:  Scale:  (0)= no chance of dozing; (1)= slight chance of dozing; (2)= moderate chance of dozing; (3)= high chance of dozing  Chance  Situtation    Sitting and reading: 0    Watching TV: 0    Sitting Inactive in public: 0    As a passenger in car: 0      Lying down to rest: 0    Sitting and talking: 0    Sitting quielty after lunch: 0    In a car, stopped in traffic: 0   TOTAL SCORE:   0 out of 24    SLEEP STUDIES:  PSG (06/05/08)  AHI 46,  SPO2 78% TITRATION (06/22/08)  CPAP AT 10 CMH20 TITRATION (03/26/16)  CPAP AT 10 CMH20   CPAP COMPLIANCE DATA:  Date Range: 07/15/21 - 07/14/22  Average Daily Use: 8 hours 38 minutes  Median Use: 8 hours 38 minutes  Compliance for > 4 Hours: 364 days  AHI: 3.6 respiratory events per hour  Days Used: 364/365  Mask Leak: 62.7  95th Percentile Pressure: 10 cmh20  LABS: No results found for this or any previous visit (from the past 2160 hour(s)).  Radiology: VAS US CAROTID  Result Date: 09/17/2020 Carotid Arterial Duplex Study Indications:       Visual disturbance. Risk Factors:      Diabetes. Comparison Study:  no prior Performing Technologist: [name] RVS  Examination Guidelines: A complete evaluation includes B-mode imaging, spectral Doppler, color Doppler, and power Doppler as needed of all accessible portions of each vessel. Bilateral testing is considered an integral part of a complete examination. Limited examinations for reoccurring indications may be performed as noted.  Right Carotid Findings: +----------+--------+--------+--------+------------------+--------+           PSV cm/sEDV cm/sStenosisPlaque DescriptionComments +----------+--------+--------+--------+------------------+--------+ CCA Prox  126     29              heterogenous               +----------+--------+--------+--------+------------------+--------+ CCA Distal76      19              heterogenous               +----------+--------+--------+--------+------------------+--------+ ICA Prox  77      33      1-39%   heterogenous               +----------+--------+--------+--------+------------------+--------+ ICA Distal86      33                                         +----------+--------+--------+--------+------------------+--------+ ECA       96      18                                         +----------+--------+--------+--------+------------------+--------+  +----------+--------+-------+--------+-------------------+           PSV cm/sEDV cmsDescribeArm Pressure (mmHG) +----------+--------+-------+--------+-------------------+ [id number]                                         +----------+--------+-------+--------+-------------------+ +---------+--------+--+--------+--+---------+ VertebralPSV cm/s33EDV cm/s10Antegrade +---------+--------+--+--------+--+---------+  Left Carotid Findings: +----------+--------+--------+--------+------------------+--------+           PSV cm/sEDV cm/sStenosisPlaque DescriptionComments +----------+--------+--------+--------+------------------+--------+ CCA Prox  115     25              heterogenous               +----------+--------+--------+--------+------------------+--------+ CCA Distal72      24              heterogenous               +----------+--------+--------+--------+------------------+--------+ ICA Prox  68      28      1-39%   heterogenous               +----------+--------+--------+--------+------------------+--------+ ICA Distal82      37                                         +----------+--------+--------+--------+------------------+--------+ ECA       81      13                                         +----------+--------+--------+--------+------------------+--------+ +----------+--------+--------+--------+-------------------+  PSV cm/sEDV cm/sDescribeArm Pressure (mmHG) +----------+--------+--------+--------+-------------------+ [id number]                                          +----------+--------+--------+--------+-------------------+ +---------+--------+--+--------+--+---------+ VertebralPSV cm/s39EDV cm/s13Antegrade +---------+--------+--+--------+--+---------+   Summary: Right Carotid: Velocities in the right ICA are consistent with a 1-39% stenosis. Left Carotid: Velocities in the left ICA are consistent with a 1-39% stenosis.  Vertebrals: Bilateral vertebral arteries demonstrate antegrade flow. *See table(s) above for measurements and observations.  Electronically signed by Antony Contras MD on 09/17/2020 at 2:08:43 PM.    Final     No results found.  No results found.    Assessment and Plan: Patient Active Problem List   Diagnosis Date Noted   OSA on CPAP 07/21/2021   Encounter for hepatitis C screening test for low risk patient 07/21/2021   Elevated BP without diagnosis of hypertension 01/31/2021   Dizziness 09/10/2020   Ingrown toenail 03/04/2020   B12 deficiency 01/31/2020   Neuropathy 01/31/2020   Elevated LDL cholesterol level 01/26/2020   Flexural eczema 01/26/2020   Lightheadedness 11/28/2019   Elevated liver enzymes 11/28/2019   Alcohol use 11/28/2019   Palpitations 10/30/2019   Type 2 diabetes mellitus with diabetic neuropathy, without long-term current use of insulin ([place]) 10/23/2019   Screening for blood disease 10/23/2019   Need for influenza vaccination 10/23/2019   Atypical nevus 10/23/2019   Obesity (BMI 30.0-34.9) 03/20/2016   1. OSA on CPAP The patient does tolerate PAP and reports  benefit from PAP use. Machine is at end of life and must be replaced.  The patient was reminded how to clean equipment and advised to replace supplies routinely. The patient was also counselled on weight loss. The compliance is excellent. The AHI is 3.6.   OSA on cpap- replace machine. F/u 30d after set up.    2. CPAP use counseling CPAP Counseling: had a lengthy discussion with the patient regarding the importance of PAP therapy in management of the sleep apnea. Patient appears to understand the risk factor reduction and also understands the risks associated with untreated sleep apnea. Patient will try to make a good faith effort to remain compliant with therapy. Also instructed the patient on proper cleaning of the device including the water must be changed daily if possible and use of distilled water is  preferred. Patient understands that the machine should be regularly cleaned with appropriate recommended cleaning solutions that do not damage the PAP machine for example given white vinegar and water rinses. Other methods such as ozone treatment may not be as good as these simple methods to achieve cleaning.   3. Obesity (BMI 30.0-34.9) Obesity Counseling: Had a lengthy discussion regarding patients BMI and weight issues. Patient was instructed on portion control as well as increased activity. Also discussed caloric restrictions with trying to maintain intake less than 2000 Kcal. Discussions were made in accordance with the 5As of weight management. Simple actions such as not eating late and if able to, taking a walk is suggested.     General Counseling: I have discussed the findings of the evaluation and examination with Ovid Curd.  I have also discussed any further diagnostic evaluation thatmay be needed or ordered today. Hawke verbalizes understanding of the findings of todays visit. We also reviewed his medications today and discussed drug interactions and side effects including but not limited excessive drowsiness and altered mental states. We also  discussed that there is always a risk not just to him but also people around him. he has been encouraged to call the office with any questions or concerns that should arise related to todays visit.  No orders of the defined types were placed in this encounter.       I have personally obtained a history, examined the patient, evaluated laboratory and imaging results, formulated the assessment and plan and placed orders. This patient was seen today by Tressie Ellis, PA-C in collaboration with Dr. Devona Konig.   Allyne Gee, MD [facility] Diplomate ABMS Pulmonary Critical Care Medicine and Sleep Medicine

## 2022-07-20 ENCOUNTER — Ambulatory Visit (INDEPENDENT_AMBULATORY_CARE_PROVIDER_SITE_OTHER): Payer: BC Managed Care – PPO | Admitting: Internal Medicine

## 2022-07-20 VITALS — BP 156/91 | HR 81 | Resp 14 | Ht 75.0 in | Wt 266.7 lb

## 2022-07-20 DIAGNOSIS — Z0000 Encounter for general adult medical examination without abnormal findings: Secondary | ICD-10-CM | POA: Insufficient documentation

## 2022-07-20 DIAGNOSIS — Z7189 Other specified counseling: Secondary | ICD-10-CM | POA: Diagnosis not present

## 2022-07-20 DIAGNOSIS — E669 Obesity, unspecified: Secondary | ICD-10-CM | POA: Diagnosis not present

## 2022-07-20 DIAGNOSIS — Z9989 Dependence on other enabling machines and devices: Secondary | ICD-10-CM | POA: Diagnosis not present

## 2022-07-20 DIAGNOSIS — G4733 Obstructive sleep apnea (adult) (pediatric): Secondary | ICD-10-CM | POA: Diagnosis not present

## 2022-07-20 NOTE — Patient Instructions (Signed)

## 2022-08-01 DIAGNOSIS — G4733 Obstructive sleep apnea (adult) (pediatric): Secondary | ICD-10-CM | POA: Diagnosis not present

## 2022-08-17 ENCOUNTER — Other Ambulatory Visit: Payer: Self-pay | Admitting: Family Medicine

## 2022-08-17 ENCOUNTER — Encounter: Payer: Self-pay | Admitting: Family Medicine

## 2022-08-17 ENCOUNTER — Ambulatory Visit: Payer: BC Managed Care – PPO | Admitting: Family Medicine

## 2022-08-17 VITALS — BP 139/87 | HR 93 | Temp 97.1°F | Ht 75.0 in | Wt 261.8 lb

## 2022-08-17 DIAGNOSIS — E7800 Pure hypercholesterolemia, unspecified: Secondary | ICD-10-CM

## 2022-08-17 DIAGNOSIS — I10 Essential (primary) hypertension: Secondary | ICD-10-CM

## 2022-08-17 DIAGNOSIS — J302 Other seasonal allergic rhinitis: Secondary | ICD-10-CM

## 2022-08-17 DIAGNOSIS — Z23 Encounter for immunization: Secondary | ICD-10-CM

## 2022-08-17 DIAGNOSIS — J4520 Mild intermittent asthma, uncomplicated: Secondary | ICD-10-CM

## 2022-08-17 LAB — BASIC METABOLIC PANEL
BUN: 16 mg/dL (ref 6–23)
CO2: 27 mEq/L (ref 19–32)
Calcium: 9.3 mg/dL (ref 8.4–10.5)
Chloride: 102 mEq/L (ref 96–112)
Creatinine, Ser: 0.85 mg/dL (ref 0.40–1.50)
GFR: 99.28 mL/min (ref >60.00)
Glucose, Bld: 146 mg/dL — ABNORMAL HIGH (ref 70–99)
Potassium: 4.2 mEq/L (ref 3.5–5.1)
Sodium: 139 mEq/L (ref 135–145)

## 2022-08-17 LAB — LIPID PANEL
Cholesterol: 128 mg/dL (ref 0–200)
HDL: 29.7 mg/dL — ABNORMAL LOW (ref >39.00)
LDL Cholesterol: 73 mg/dL (ref 0–99)
NonHDL: 97.86
Total CHOL/HDL Ratio: 4
Triglycerides: 126 mg/dL (ref 0.0–149.0)
VLDL: 25.2 mg/dL (ref 0.0–40.0)

## 2022-08-17 MED ORDER — MOMETASONE FUROATE 50 MCG/ACT NA SUSP: 2.0000 | Freq: Every day | NASAL | 12 refills | Status: DC

## 2022-08-17 MED ORDER — PREDNISONE 20 MG PO TABS: 20.0000 mg | ORAL_TABLET | Freq: Two times a day (BID) | ORAL | 0 refills | Status: AC

## 2022-08-17 MED ORDER — LISINOPRIL 10 MG PO TABS: 10.0000 mg | ORAL_TABLET | Freq: Every day | ORAL | 3 refills | Status: DC

## 2022-08-17 NOTE — Progress Notes (Signed)
   Established Patient Office Visit  Subjective   Patient ID: Terry Bonilla, male    DOB: [birth year]  Age: 51 y.o. MRN: [id number]  Chief Complaint  Patient presents with   Follow-up    6 month follow up c/o dry cough x 5 days.mucinex for symptoms.    HPI    ROS    Objective:     BP 139/87 (BP Location: Left Arm, Patient Position: Sitting, Cuff Size: Large)   Pulse 93   Temp (!) 97.1 F (36.2 C) (Temporal)   Ht '6\' 3"'$  (1.905 m)   Wt 261 lb 12.8 oz (118.8 kg)   SpO2 97%   BMI 32.72 kg/m  BP Readings from Last 3 Encounters:  08/17/22 139/87  07/20/22 (!) 156/91  03/06/22 122/72   Wt Readings from Last 3 Encounters:  08/17/22 261 lb 12.8 oz (118.8 kg)  07/20/22 266 lb 11.2 oz (121 kg)  03/06/22 260 lb 12.8 oz (118.3 kg)      Physical Exam   No results found for any visits on 08/17/22.    The ASCVD Risk score (Arnett DK, et al., 2019) failed to calculate for the following reasons:   The valid total cholesterol range is 130 to 320 mg/dL    Assessment & Plan:   Problem List Items Addressed This Visit       Other   Need for influenza vaccination   Relevant Orders   Flu Vaccine QUAD 6+ mos PF IM (Fluarix Quad PF)   Elevated LDL cholesterol level - Primary   Relevant Orders   Lipid panel   Other Visit Diagnoses     Essential hypertension       Relevant Medications   lisinopril (ZESTRIL) 10 MG tablet   Other Relevant Orders   Basic metabolic panel   Seasonal allergic rhinitis, unspecified trigger       Relevant Medications   mometasone (NASONEX) 50 MCG/ACT nasal spray   Mild intermittent reactive airway disease without complication       Relevant Medications   predniSONE (DELTASONE) 20 MG tablet       Return in about 3 months (around 11/16/2022), or if symptoms worsen or fail to improve.  Have increased lisinopril to 10 mg daily.  Prednisone 20 twice daily for 7 days for reactive airway disease.  Nasonex daily for allergic rhinitis.  Flu shot  today.  Follow-up in 3 months recheck of blood pressure and physical exam.  Libby Maw, MD

## 2022-08-25 DIAGNOSIS — G4733 Obstructive sleep apnea (adult) (pediatric): Secondary | ICD-10-CM | POA: Diagnosis not present

## 2022-08-28 ENCOUNTER — Encounter: Payer: Self-pay | Admitting: Family Medicine

## 2022-09-03 ENCOUNTER — Encounter: Payer: Self-pay | Admitting: Internal Medicine

## 2022-09-03 ENCOUNTER — Ambulatory Visit: Payer: BC Managed Care – PPO | Admitting: Internal Medicine

## 2022-09-03 VITALS — BP 130/82 | HR 88 | Ht 75.0 in | Wt 263.8 lb

## 2022-09-03 DIAGNOSIS — E1140 Type 2 diabetes mellitus with diabetic neuropathy, unspecified: Secondary | ICD-10-CM

## 2022-09-03 DIAGNOSIS — E538 Deficiency of other specified B group vitamins: Secondary | ICD-10-CM

## 2022-09-03 DIAGNOSIS — E785 Hyperlipidemia, unspecified: Secondary | ICD-10-CM | POA: Diagnosis not present

## 2022-09-03 LAB — POCT GLYCOSYLATED HEMOGLOBIN (HGB A1C): Hemoglobin A1C: 6.2 % — AB (ref 4.0–5.6)

## 2022-09-03 MED ORDER — METFORMIN HCL ER 750 MG PO TB24: ORAL_TABLET | ORAL | 3 refills | Status: DC

## 2022-09-03 NOTE — Progress Notes (Signed)
Patient ID: Terry Bonilla, male   DOB: 07-Jan-1971, 51 y.o.   MRN: [id number]   HPI: Terry Bonilla is a 51 y.o.-year-old male, referred by his PCP, Dr. Ethelene Hal, for management of DM2, dx on 10/15/2019, non-insulin-dependent, uncontrolled, with long-term complications (peripheral neuropathy).  Last visit 6 months ago.  Interim history: No increased urination, blurry vision, nausea, chest pain.   He continues to have some numbness and tingling in his toes. He was on Prednisone x 1 week last month for a cough >> sugars higher. Cough is almost resolved.  He saw urology for incomplete bladder emptying and back pain >> started Meloxicam and Alfuzosin on a prn basis.  DM2: Reviewed history: He was diagnosed with diabetes in 09/2019 after he presented to the ED with a syncopal episode.  In ED, glucose was 372 and he had glycosuria on urinalysis.  HbA1c was high.  He was started on Metformin.  Reviewed HbA1c levels: Lab Results  Component Value Date   HGBA1C 5.6 09/01/2021   HGBA1C 5.7 (A) 04/11/2021   HGBA1C 6.7 (A) 12/13/2020   HGBA1C 6.8 (A) 08/05/2020   HGBA1C 6.6 (A) 04/01/2020   HGBA1C 6.4 (A) 01/02/2020   HGBA1C 10.0 (H) 10/23/2019   Pt is on a regimen of:  - Metformin ER 750 mg 1x a day >> 1500 mg with dinner >> lightheadedness >> 750 mg 2x a day >> 1500 mg at dinnertime We discussed about Rybelsus 3 mg before breakfast at last OV - 07/2020, but not started yet.  He had palpitations with an instant release Metformin.  Pt checks his sugars twice a day: - am: 89-126, 137 >> 108-144 >> 112-139, 144 >> 118, 126-138, 145 - 2h after b'fast: n/c - before lunch: n/c - 2h after lunch: n/c - before dinner:  103-135, 140 >> 101-114 >> n/c >> 80, 91, 101-147, 156 - 2h after dinner: n/c - bedtime: 130, 141-169, 209 >> n/c - nighttime: n/c Lowest sugar was 130 >> ... 91 >> 80 ; it is unclear at which level he has hypoglycemia awareness. Highest sugar was 209 >> .Marland KitchenMarland Kitchen 150 (doughnut) >> 180 -  Prednisone, OTW 156  Glucometer: [employer] brand  Feels improved after his diagnosis of diabetes: - Breakfast: [place] links >> protein bar - snack: bar - Lunch: chicken and broccoli >> lean green lunch: greens + white meat - pm: nuts >> brownie - Dinner: chicken >> lean and green dinner - Snack after dinner:bar He stopped sweet drinks.  He drinks 1-2 alcoholic drinks at night.  -No CKD, last BUN/creatinine:  Lab Results  Component Value Date   BUN 16 08/17/2022   BUN 17 02/12/2022   CREATININE 0.85 08/17/2022   CREATININE 0.80 02/12/2022  We started lisinopril 5 mg daily in 08/2021. He increased to 10 mg daily 08/2022.  -+ HL; last set of lipids: Lab Results  Component Value Date   CHOL 128 08/17/2022   HDL 29.70 (L) 08/17/2022   LDLCALC 73 08/17/2022   LDLDIRECT 86.0 08/19/2021   TRIG 126.0 08/17/2022   CHOLHDL 4 08/17/2022  On Lipitor 10.  - last eye exam was in 01/2022: No DR reortedly (Dr. Macarthur Critchley). He is on Travaprost for increased pressure.  -+ Numbness and tingling in his feet. He is seen by podiatry. He had ingrown toenail sx. X2.  He saw Dr. Jacqualyn Posey 01/05/2022.  He had onychodystrophy, ingrown toenails. Back on ALA.  Pt has FH of DM in elder brother.  He also has vitamin  B12 deficiency:  We started B12 injections.  His shooting pain in his legs improved after starting this.  We were able to transition him to 1000 mcg p.o. B12.  Reviewed his B12 levels: Lab Results  Component Value Date   VITAMINB12 888 02/12/2022   VITAMINB12 741 08/19/2021   VITAMINB12 790 11/19/2020   VITAMINB12 473 08/02/2020   VITAMINB12 389 05/14/2020   VITAMINB12 196 (L) 01/30/2020   Allergic to sulfa.  She was on alfuzosin and meloxicam for prostatitis in the past.  He feels that these medications improved his dizziness.  He is now off these medications and dizziness has not recurred. He also has a history of alcohol use and transaminitis. LFTs were normal in 07/2021.  ROS: + See  HPI Neurological: no tremors/+ numbness in the tips of his toes/no tingling/no dizziness  I reviewed pt's medications, allergies, PMH, social hx, family hx, and changes were documented in the history of present illness. Otherwise, unchanged from my initial visit note.  Past Medical History:  Diagnosis Date   Diabetes mellitus without complication ([place]) 87/5643   pre-diabetes A1C 6.4   Sleep apnea 2009   cpap   Substance abuse ([place])    ETOH   Past Surgical History:  Procedure Laterality Date   KIDNEY SURGERY Left 08/09/2020   benign cyst laser removal    TONSILLECTOMY     age 51   Social History   Socioeconomic History   Marital status: Single    Spouse name: Not on file   Number of children: Not on file   Years of education: Not on file   Highest education level: Not on file  Occupational History   Not on file  Tobacco Use   Smoking status: Never   Smokeless tobacco: Never  Vaping Use   Vaping Use: Never used  Substance and Sexual Activity   Alcohol use: Yes    Alcohol/week: 4.0 - 6.0 standard drinks of alcohol    Types: 4 - 6 Standard drinks or equivalent per week    Comment: two drinks nightly occasionally    Drug use: Never   Sexual activity: Not on file  Other Topics Concern   Not on file  Social History Narrative   Lives alone   Right handed   Caffeine: 1 cup/day   Social Determinants of Health   Financial Resource Strain: Not on file  Food Insecurity: Not on file  Transportation Needs: Not on file  Physical Activity: Not on file  Stress: Not on file  Social Connections: Not on file  Intimate Partner Violence: Not on file   Current Outpatient Medications on File Prior to Visit  Medication Sig Dispense Refill   alfuzosin (UROXATRAL) 10 MG 24 hr tablet Take 10 mg by mouth daily.     ammonium lactate (AMLACTIN) 12 % cream Apply topically as needed for dry skin. 385 g 0   atorvastatin (LIPITOR) 10 MG tablet TAKE 1 TABLET BY MOUTH EVERY DAY IN THE  EVENING 90 tablet 3   betamethasone dipropionate 0.05 % cream Apply 1 application topically as needed.     Blood Glucose Calibration (ASCENSIA CONTOUR VI) by In Vitro route.     Blood Glucose Monitoring Suppl (FREESTYLE FREEDOM LITE) w/Device KIT 1 kit by Does not apply route as needed. 1 kit 0   finasteride (PROPECIA) 1 MG tablet Take 1 tablet by mouth daily.     fluticasone (FLONASE) 50 MCG/ACT nasal spray Place 2 sprays into both nostrils daily. 16 g  6   Glucose Blood (ASCENSIA CONTOUR TEST VI) 100 each by In Vitro route 1 day or 1 dose.     Lancets (FREESTYLE) lancets USE AS DIRECTED AS NEEDED 100 each 0   latanoprost (XALATAN) 0.005 % ophthalmic solution 1 drop at bedtime.     lisinopril (ZESTRIL) 10 MG tablet Take 1 tablet (10 mg total) by mouth daily. 90 tablet 3   metFORMIN (GLUCOPHAGE-XR) 750 MG 24 hr tablet TAKE 2 TABLETS BY MOUTH WITH DINNER EVERY DAY 180 tablet 3   triamcinolone (KENALOG) 0.025 % ointment Apply 1 application topically 2 (two) times daily. Not for face or private areas. 80 g 0   vitamin B-12 (CYANOCOBALAMIN) 1000 MCG tablet Take 1,000 mcg by mouth daily.     No current facility-administered medications on file prior to visit.   Allergies  Allergen Reactions   Crab [Shellfish Allergy] Hives   Elemental Sulfur Rash   Sulfa Antibiotics Hives   PMH: -Pertinent PMH reviewed in HPI  PE: BP 130/82 (BP Location: Left Arm, Patient Position: Sitting, Cuff Size: Normal)   Pulse 88   Ht '6\' 3"'  (1.905 m)   Wt 263 lb 12.8 oz (119.7 kg)   SpO2 98%   BMI 32.97 kg/m  Wt Readings from Last 3 Encounters:  09/03/22 263 lb 12.8 oz (119.7 kg)  08/17/22 261 lb 12.8 oz (118.8 kg)  07/20/22 266 lb 11.2 oz (121 kg)   Constitutional: overweight, in NAD Eyes:  EOMI, no exophthalmos ENT: no neck masses, no cervical lymphadenopathy Cardiovascular: RRR, No MRG Respiratory: CTA B Musculoskeletal: no deformities Skin:no rashes Neurological: no tremor with outstretched  hands  ASSESSMENT: 1. DM2, non-insulin-dependent, uncontrolled, with complications - PN  He does not have a family history of medullary thyroid cancer or personal history of pancreatitis.  2. HL  3.  Vitamin B12 deficiency  PLAN:  1. Patient with history of type 2 diabetes diagnosed after a syncopal episode in 09/2019.  He was initially started on metformin IR, which he could not tolerate due to palpitations.  He switched to metformin ER, which he tolerates well.  He is on nutrition and changed his diet and also stopped sweet drinks.  He was on the [employer], also, in the.  HbA1c improved and at last visit it was 5.6%, stable.  At last visit, he was only checking sugars in the morning and they were mostly at goal but I advised him to also check later in the day.  Otherwise, we did not change his regimen. -At today's visit, sugars appear to be higher to 3 weeks.  He has been on prednisone for upper respiratory infection insurance did not improve to his previous levels even after stopping prednisone.  We did discuss about reducing dietary indiscretions (for example, still drinks milk, which I advised him to stop) and also, if he has a snack in the afternoon, to try to go for a walk afterwards.  He did see that exercise improves his blood sugars especially later in the day.  Otherwise, for now, I advised him to continue the same dose of metformin.  He tolerates it well. - I suggested to:  Patient Instructions  Please continue: - Metformin ER 1500 mg with dinner  Also, continue: - Vitamin B12 1000 mcg daily - Lisinopril 10 mg daily  Please return in 6 months with your sugar log.   - we checked his HbA1c: 6.2% (higher) - advised to check sugars at different times of the day - 1x  a day, rotating check times - advised for yearly eye exams >> he is UTD - return to clinic in 6 months  2. HL -I reviewed latest lipid panel from last month: Fractions at goal with the exception of a low  HDL: Lab Results  Component Value Date   CHOL 128 08/17/2022   HDL 29.70 (L) 08/17/2022   LDLCALC 73 08/17/2022   LDLDIRECT 86.0 08/19/2021   TRIG 126.0 08/17/2022   CHOLHDL 4 08/17/2022  -He continues on Lipitor 10 mg daily without side effects  3.  Vitamin B12 deficiency -He was initially on B12 injections but then transitioned to p.o. B12 1000 mcg daily -Tingling improved after starting B12 but he still has some tingling in the tips of his fingers >> I suggested alpha-lipoic acid 600 mg twice a day -Latest B12 levels are normal- 741 (07/2021) and 888 (01/2022) -At last visit, he still had neuropathy symptoms and I recommended Benfotiamine 300 mg twice a day.  He tried this but it did not help.  He continues alpha lipoid acid -Has some mild numbness in his toes    Philemon Kingdom, MD PhD [facility] Endocrinology

## 2022-09-03 NOTE — Patient Instructions (Addendum)
Please continue: - Metformin ER 1500 mg with dinner  Also, continue: - Vitamin B12 1000 mcg daily - Lisinopril 10 mg daily  Please return in 6 months with your sugar log.

## 2022-09-08 DIAGNOSIS — Z03818 Encounter for observation for suspected exposure to other biological agents ruled out: Secondary | ICD-10-CM | POA: Diagnosis not present

## 2022-09-08 DIAGNOSIS — Z20828 Contact with and (suspected) exposure to other viral communicable diseases: Secondary | ICD-10-CM | POA: Diagnosis not present

## 2022-09-08 DIAGNOSIS — J209 Acute bronchitis, unspecified: Secondary | ICD-10-CM | POA: Diagnosis not present

## 2022-09-25 DIAGNOSIS — G4733 Obstructive sleep apnea (adult) (pediatric): Secondary | ICD-10-CM | POA: Diagnosis not present

## 2022-10-05 DIAGNOSIS — H40053 Ocular hypertension, bilateral: Secondary | ICD-10-CM | POA: Diagnosis not present

## 2022-10-21 DIAGNOSIS — R350 Frequency of micturition: Secondary | ICD-10-CM | POA: Diagnosis not present

## 2022-10-25 DIAGNOSIS — G4733 Obstructive sleep apnea (adult) (pediatric): Secondary | ICD-10-CM | POA: Diagnosis not present

## 2022-11-25 DIAGNOSIS — G4733 Obstructive sleep apnea (adult) (pediatric): Secondary | ICD-10-CM | POA: Diagnosis not present

## 2022-11-26 ENCOUNTER — Ambulatory Visit: Payer: BC Managed Care – PPO | Admitting: Family Medicine

## 2022-11-26 ENCOUNTER — Encounter: Payer: Self-pay | Admitting: Family Medicine

## 2022-11-26 VITALS — BP 134/82 | HR 80 | Temp 96.9°F | Ht 75.0 in | Wt 269.6 lb

## 2022-11-26 DIAGNOSIS — G629 Polyneuropathy, unspecified: Secondary | ICD-10-CM | POA: Diagnosis not present

## 2022-11-26 DIAGNOSIS — I10 Essential (primary) hypertension: Secondary | ICD-10-CM | POA: Diagnosis not present

## 2022-11-26 DIAGNOSIS — E7800 Pure hypercholesterolemia, unspecified: Secondary | ICD-10-CM

## 2022-11-26 DIAGNOSIS — M545 Low back pain, unspecified: Secondary | ICD-10-CM

## 2022-11-26 LAB — BASIC METABOLIC PANEL
BUN: 15 mg/dL (ref 6–23)
CO2: 28 mEq/L (ref 19–32)
Calcium: 9.2 mg/dL (ref 8.4–10.5)
Chloride: 102 mEq/L (ref 96–112)
Creatinine, Ser: 0.81 mg/dL (ref 0.40–1.50)
GFR: 100.54 mL/min (ref >60.00)
Glucose, Bld: 144 mg/dL — ABNORMAL HIGH (ref 70–99)
Potassium: 4.2 mEq/L (ref 3.5–5.1)
Sodium: 139 mEq/L (ref 135–145)

## 2022-11-26 MED ORDER — ATORVASTATIN CALCIUM 10 MG PO TABS: ORAL_TABLET | ORAL | 3 refills | Status: DC

## 2022-11-26 NOTE — Progress Notes (Signed)
Established Patient Office Visit   Subjective:  Patient ID: Terry Bonilla, male    DOB: [birth year]  Age: 52 y.o. MRN: [id number]  Chief Complaint  Patient presents with   Follow-up    3 month follow up on medication. Lower back little achy from time to time. Patient fasting.     HPI Encounter Diagnoses  Name Primary?   Essential hypertension Yes   Elevated LDL cholesterol level    Bilateral low back pain without sciatica, unspecified chronicity    Neuropathy    For follow-up of elevated blood pressure and LDL cholesterol.  Tolerating elevated dose of lisinopril well.  Continues with atorvastatin without issue.  Reports lower back pain bilateral and nonradiating.  There is no numbness or tingling associated with it in his lower extremities other than known neuropathy in his feet.  Denies bowel or bladder incontinence.   Review of Systems  Constitutional: Negative.   HENT: Negative.    Eyes:  Negative for blurred vision, discharge and redness.  Respiratory: Negative.    Cardiovascular: Negative.   Gastrointestinal:  Negative for abdominal pain.  Genitourinary: Negative.   Musculoskeletal:  Positive for back pain. Negative for myalgias.  Skin:  Negative for rash.  Neurological:  Negative for tingling, loss of consciousness and weakness.  Endo/Heme/Allergies:  Negative for polydipsia.     Current Outpatient Medications:    alfuzosin (UROXATRAL) 10 MG 24 hr tablet, Take 10 mg by mouth daily., Disp: , Rfl:    ammonium lactate (AMLACTIN) 12 % cream, Apply topically as needed for dry skin., Disp: 385 g, Rfl: 0   betamethasone dipropionate 0.05 % cream, Apply 1 application topically as needed., Disp: , Rfl:    Blood Glucose Calibration (ASCENSIA CONTOUR VI), by In Vitro route., Disp: , Rfl:    Blood Glucose Monitoring Suppl (FREESTYLE FREEDOM LITE) w/Device KIT, 1 kit by Does not apply route as needed., Disp: 1 kit, Rfl: 0   finasteride (PROPECIA) 1 MG tablet, Take 1 tablet by  mouth daily., Disp: , Rfl:    fluticasone (FLONASE) 50 MCG/ACT nasal spray, Place 2 sprays into both nostrils daily., Disp: 16 g, Rfl: 6   Glucose Blood (ASCENSIA CONTOUR TEST VI), 100 each by In Vitro route 1 day or 1 dose., Disp: , Rfl:    Lancets (FREESTYLE) lancets, USE AS DIRECTED AS NEEDED, Disp: 100 each, Rfl: 0   latanoprost (XALATAN) 0.005 % ophthalmic solution, 1 drop at bedtime., Disp: , Rfl:    lisinopril (ZESTRIL) 10 MG tablet, Take 1 tablet (10 mg total) by mouth daily., Disp: 90 tablet, Rfl: 3   metFORMIN (GLUCOPHAGE-XR) 750 MG 24 hr tablet, Take 1500 mg by mouth with dinner, Disp: 180 tablet, Rfl: 3   triamcinolone (KENALOG) 0.025 % ointment, Apply 1 application topically 2 (two) times daily. Not for face or private areas., Disp: 80 g, Rfl: 0   vitamin B-12 (CYANOCOBALAMIN) 1000 MCG tablet, Take 1,000 mcg by mouth daily., Disp: , Rfl:    atorvastatin (LIPITOR) 10 MG tablet, TAKE 1 TABLET BY MOUTH EVERY DAY IN THE EVENING, Disp: 90 tablet, Rfl: 3   Objective:     BP 134/82 (BP Location: Right Arm, Patient Position: Sitting, Cuff Size: Large)   Pulse 80   Temp (!) 96.9 F (36.1 C) (Temporal)   Ht _0  (1.905 m)   Wt 269 lb 9.6 oz (122.3 kg)   SpO2 98%   BMI 33.70 kg/m  BP Readings from Last 3 Encounters:  11/26/22 134/82  09/03/22 130/82  08/17/22 139/87   Wt Readings from Last 3 Encounters:  11/26/22 269 lb 9.6 oz (122.3 kg)  09/03/22 263 lb 12.8 oz (119.7 kg)  08/17/22 261 lb 12.8 oz (118.8 kg)      Physical Exam Constitutional:      General: He is not in acute distress.    Appearance: Normal appearance. He is not ill-appearing, toxic-appearing or diaphoretic.  HENT:     Head: Normocephalic and atraumatic.     Right Ear: External ear normal.     Left Ear: External ear normal.  Eyes:     General: No scleral icterus.       Right eye: No discharge.        Left eye: No discharge.     Extraocular Movements: Extraocular movements intact.      Conjunctiva/sclera: Conjunctivae normal.  Pulmonary:     Effort: Pulmonary effort is normal. No respiratory distress.  Musculoskeletal:     Lumbar back: No bony tenderness. Normal range of motion. Negative right straight leg raise test.  Skin:    General: Skin is warm and dry.  Neurological:     Mental Status: He is alert and oriented to person, place, and time.     Motor: No weakness.     Deep Tendon Reflexes:     Reflex Scores:      Patellar reflexes are 1+ on the right side.      Achilles reflexes are 1+ on the right side and 1+ on the left side. Psychiatric:        Mood and Affect: Mood normal.        Behavior: Behavior normal.      No results found for any visits on 11/26/22.    The ASCVD Risk score (Arnett DK, et al., 2019) failed to calculate for the following reasons:   The valid total cholesterol range is 130 to 320 mg/dL    Assessment & Plan:   Essential hypertension -     Basic metabolic panel  Elevated LDL cholesterol level -     Atorvastatin Calcium; TAKE 1 TABLET BY MOUTH EVERY DAY IN THE EVENING  Dispense: 90 tablet; Refill: 3  Bilateral low back pain without sciatica, unspecified chronicity  Neuropathy    Return in about 6 months (around 05/27/2023), or Return in 6 months for physical..  Continue atorvastatin and lisinopril 10 mg each for elevated cholesterol and blood pressure.  Encouraged weight loss.   information was given for back exercises.  Again stressed that weight loss would help.  Consider plain films at next visit  Libby Maw, MD

## 2022-11-29 NOTE — Progress Notes (Unsigned)
[facility] [place], [place] [address]  Pulmonary Sleep Medicine   Office Visit Note  Patient Name: Terry Bonilla DOB: 1971/05/24 MRN [id number]    Chief Complaint: Obstructive Sleep Apnea visit  Brief History:  Terry Bonilla is seen today for a follow up after set up on a replacement CPAP@ 10cmh20. The patient has a 6.5 year history of sleep apnea. Patient is using PAP nightly.  The patient feels rested after sleeping with PAP.  The patient reports benefiting from PAP use. Reported sleepiness is improved and the Epworth Sleepiness Score is 1 out of 24. The patient does not take naps. The patient complains of the following: No complaints. Some dryness we discussed adjusting his humidity setting.  The compliance download shows 70%  compliance with an average use time of 8.5 hours. The AHI is 4.8  The patient does not complain of limb movements disrupting sleep.  ROS  General: (-) fever, (-) chills, (-) night sweat Nose and Sinuses: (-) nasal stuffiness or itchiness, (-) postnasal drip, (-) nosebleeds, (-) sinus trouble. Mouth and Throat: (-) sore throat, (-) hoarseness. Neck: (-) swollen glands, (-) enlarged thyroid, (-) neck pain. Respiratory: - cough, - shortness of breath, - wheezing. Neurologic: - numbness, - tingling. Psychiatric: - anxiety, - depression   Current Medication: Outpatient Encounter Medications as of 11/30/2022  Medication Sig Note   alfuzosin (UROXATRAL) 10 MG 24 hr tablet Take 10 mg by mouth daily. 08/17/2022: PRN   ammonium lactate (AMLACTIN) 12 % cream Apply topically as needed for dry skin. 08/02/2020: prn   atorvastatin (LIPITOR) 10 MG tablet TAKE 1 TABLET BY MOUTH EVERY DAY IN THE EVENING    betamethasone dipropionate 0.05 % cream Apply 1 application topically as needed. 08/02/2020: prn   Blood Glucose Calibration (ASCENSIA CONTOUR VI) by In Vitro route.    Blood Glucose Monitoring Suppl (FREESTYLE FREEDOM LITE) w/Device KIT 1 kit by Does  not apply route as needed.    finasteride (PROPECIA) 1 MG tablet Take 1 tablet by mouth daily.    fluticasone (FLONASE) 50 MCG/ACT nasal spray Place 2 sprays into both nostrils daily.    Glucose Blood (ASCENSIA CONTOUR TEST VI) 100 each by In Vitro route 1 day or 1 dose.    Lancets (FREESTYLE) lancets USE AS DIRECTED AS NEEDED    latanoprost (XALATAN) 0.005 % ophthalmic solution 1 drop at bedtime.    lisinopril (ZESTRIL) 10 MG tablet Take 1 tablet (10 mg total) by mouth daily.    metFORMIN (GLUCOPHAGE-XR) 750 MG 24 hr tablet Take 1500 mg by mouth with dinner    triamcinolone (KENALOG) 0.025 % ointment Apply 1 application topically 2 (two) times daily. Not for face or private areas.    vitamin B-12 (CYANOCOBALAMIN) 1000 MCG tablet Take 1,000 mcg by mouth daily.    No facility-administered encounter medications on file as of 11/30/2022.    Surgical History: Past Surgical History:  Procedure Laterality Date   KIDNEY SURGERY Left 08/09/2020   benign cyst laser removal    TONSILLECTOMY     age 36    Medical History: Past Medical History:  Diagnosis Date   Diabetes mellitus without complication ([place]) 76/2831   pre-diabetes A1C 6.4   Sleep apnea 2009   cpap   Substance abuse ([place])    ETOH    Family History: Non contributory to the present illness  Social History: Social History   Socioeconomic History   Marital status: Single    Spouse name: Not on  file   Number of children: Not on file   Years of education: Not on file   Highest education level: Not on file  Occupational History   Not on file  Tobacco Use   Smoking status: Never   Smokeless tobacco: Never  Vaping Use   Vaping Use: Never used  Substance and Sexual Activity   Alcohol use: Yes    Alcohol/week: 4.0 - 6.0 standard drinks of alcohol    Types: 4 - 6 Standard drinks or equivalent per week    Comment: two drinks nightly occasionally    Drug use: Never   Sexual activity: Not on file  Other Topics Concern    Not on file  Social History Narrative   Lives alone   Right handed   Caffeine: 1 cup/day   Social Determinants of Health   Financial Resource Strain: Not on file  Food Insecurity: Not on file  Transportation Needs: Not on file  Physical Activity: Not on file  Stress: Not on file  Social Connections: Not on file  Intimate Partner Violence: Not on file    Vital Signs: Blood pressure (!) 145/92, pulse 81, resp. rate 14, height '6\' 3"'$  (1.905 m), weight 271 lb (122.9 kg), SpO2 97 %. Body mass index is 33.87 kg/m.    Examination: General Appearance: The patient is well-developed, well-nourished, and in no distress. Neck Circumference: 51cm Skin: Gross inspection of skin unremarkable. Head: normocephalic, no gross deformities. Eyes: no gross deformities noted. ENT: ears appear grossly normal Neurologic: Alert and oriented. No involuntary movements.  STOP BANG RISK ASSESSMENT S (snore) Have you been told that you snore?     NO   T (tired) Are you often tired, fatigued, or sleepy during the day?   NO  O (obstruction) Do you stop breathing, choke, or gasp during sleep? NO   P (pressure) Do you have or are you being treated for high blood pressure? YES   B (BMI) Is your body index greater than 35 kg/m? NO   A (age) Are you 70 years old or older? YES   N (neck) Do you have a neck circumference greater than 16 inches?   YES   G (gender) Are you a male? YES   TOTAL STOP/BANG "YES" ANSWERS 4       A STOP-Bang score of 2 or less is considered low risk, and a score of 5 or more is high risk for having either moderate or severe OSA. For people who score 3 or 4, doctors may need to perform further assessment to determine how likely they are to have OSA.         EPWORTH SLEEPINESS SCALE:  Scale:  (0)= no chance of dozing; (1)= slight chance of dozing; (2)= moderate chance of dozing; (3)= high chance of dozing  Chance  Situtation    Sitting and reading: 0    Watching TV: 0     Sitting Inactive in public: 0    As a passenger in car: 1      Lying down to rest: 0    Sitting and talking: 0    Sitting quielty after lunch: 0    In a car, stopped in traffic: 0   TOTAL SCORE:   1 out of 24    SLEEP STUDIES:  PSG (06/05/08)  AHI 46, SPO2 78% TITRATION (06/22/08)  CPAP AT 10 CMH20 TITRATION (03/26/16)  CPAP AT 10 CMH20    CPAP COMPLIANCE DATA:  Date Range: 10/30/22-11/28/22  Average Daily Use: 8 hours 41 min  Median Use: 8 hrs 26 min  Compliance for > 4 Hours: 70% days  AHI: 4.8 respiratory events per hour  Days Used: 21/30  Mask Leak: 41.4  95th Percentile Pressure: 10         LABS: Recent Results (from the past 2160 hour(s))  POCT glycosylated hemoglobin (Hb A1C)     Status: Abnormal   Collection Time: 09/03/22  9:15 AM  Result Value Ref Range   Hemoglobin A1C 6.2 (A) 4.0 - 5.6 %   HbA1c POC (<> result, manual entry)     HbA1c, POC (prediabetic range)     HbA1c, POC (controlled diabetic range)    Basic metabolic panel     Status: Abnormal   Collection Time: 11/26/22  9:31 AM  Result Value Ref Range   Sodium 139 135 - 145 mEq/L   Potassium 4.2 3.5 - 5.1 mEq/L   Chloride 102 96 - 112 mEq/L   CO2 28 19 - 32 mEq/L   Glucose, Bld 144 (H) 70 - 99 mg/dL   BUN 15 6 - 23 mg/dL   Creatinine, Ser 0.81 0.40 - 1.50 mg/dL   GFR 100.54 >60.00 mL/min    Comment: Calculated using the CKD-EPI Creatinine Equation (2021)   Calcium 9.2 8.4 - 10.5 mg/dL    Radiology: VAS US CAROTID  Result Date: 09/17/2020 Carotid Arterial Duplex Study Indications:       Visual disturbance. Risk Factors:      Diabetes. Comparison Study:  no prior Performing Technologist: [name] RVS  Examination Guidelines: A complete evaluation includes B-mode imaging, spectral Doppler, color Doppler, and power Doppler as needed of all accessible portions of each vessel. Bilateral testing is considered an integral part of a complete examination. Limited examinations  for reoccurring indications may be performed as noted.  Right Carotid Findings: +----------+--------+--------+--------+------------------+--------+           PSV cm/sEDV cm/sStenosisPlaque DescriptionComments +----------+--------+--------+--------+------------------+--------+ CCA Prox  126     29              heterogenous               +----------+--------+--------+--------+------------------+--------+ CCA Distal76      19              heterogenous               +----------+--------+--------+--------+------------------+--------+ ICA Prox  77      33      1-39%   heterogenous               +----------+--------+--------+--------+------------------+--------+ ICA Distal86      33                                         +----------+--------+--------+--------+------------------+--------+ ECA       96      18                                         +----------+--------+--------+--------+------------------+--------+ +----------+--------+-------+--------+-------------------+           PSV cm/sEDV cmsDescribeArm Pressure (mmHG) +----------+--------+-------+--------+-------------------+ [id number]                                         +----------+--------+-------+--------+-------------------+ +---------+--------+--+--------+--+---------+  VertebralPSV cm/s33EDV cm/s10Antegrade +---------+--------+--+--------+--+---------+  Left Carotid Findings: +----------+--------+--------+--------+------------------+--------+           PSV cm/sEDV cm/sStenosisPlaque DescriptionComments +----------+--------+--------+--------+------------------+--------+ CCA Prox  115     25              heterogenous               +----------+--------+--------+--------+------------------+--------+ CCA Distal72      24              heterogenous               +----------+--------+--------+--------+------------------+--------+ ICA Prox  68      28      1-39%   heterogenous                +----------+--------+--------+--------+------------------+--------+ ICA Distal82      37                                         +----------+--------+--------+--------+------------------+--------+ ECA       81      13                                         +----------+--------+--------+--------+------------------+--------+ +----------+--------+--------+--------+-------------------+           PSV cm/sEDV cm/sDescribeArm Pressure (mmHG) +----------+--------+--------+--------+-------------------+ [id number]                                          +----------+--------+--------+--------+-------------------+ +---------+--------+--+--------+--+---------+ VertebralPSV cm/s39EDV cm/s13Antegrade +---------+--------+--+--------+--+---------+   Summary: Right Carotid: Velocities in the right ICA are consistent with a 1-39% stenosis. Left Carotid: Velocities in the left ICA are consistent with a 1-39% stenosis. Vertebrals: Bilateral vertebral arteries demonstrate antegrade flow. *See table(s) above for measurements and observations.  Electronically signed by Antony Contras MD on 09/17/2020 at 2:08:43 PM.    Final     No results found.  No results found.    Assessment and Plan: Patient Active Problem List   Diagnosis Date Noted   Bilateral low back pain without sciatica 11/26/2022   Essential hypertension 11/26/2022   CPAP use counseling 07/20/2022   OSA on CPAP 07/21/2021   Encounter for hepatitis C screening test for low risk patient 07/21/2021   Elevated BP without diagnosis of hypertension 01/31/2021   Dizziness 09/10/2020   Ingrown toenail 03/04/2020   B12 deficiency 01/31/2020   Neuropathy 01/31/2020   Elevated LDL cholesterol level 01/26/2020   Flexural eczema 01/26/2020   Lightheadedness 11/28/2019   Elevated liver enzymes 11/28/2019   Alcohol use 11/28/2019   Palpitations 10/30/2019   Type 2 diabetes mellitus with diabetic neuropathy, without  long-term current use of insulin ([place]) 10/23/2019   Screening for blood disease 10/23/2019   Need for influenza vaccination 10/23/2019   Atypical nevus 10/23/2019   Obesity (BMI 30.0-34.9) 03/20/2016    1. OSA on CPAP The patient does tolerate PAP and reports  benefit from PAP use. The patient was reminded how to clean equipment and advised to replace supplies more frequently. The patient was also counselled on weight loss. The compliance is good. The AHI is 4.8, elevated due to mask leak. .   OSA on cpap- controlled. Continue with excellent compliance with pap. CPAP continues to be  medically necessary to treat this patient's OSA. F/u one year.     2. CPAP use counseling CPAP Counseling: had a lengthy discussion with the patient regarding the importance of PAP therapy in management of the sleep apnea. Patient appears to understand the risk factor reduction and also understands the risks associated with untreated sleep apnea. Patient will try to make a good faith effort to remain compliant with therapy. Also instructed the patient on proper cleaning of the device including the water must be changed daily if possible and use of distilled water is preferred. Patient understands that the machine should be regularly cleaned with appropriate recommended cleaning solutions that do not damage the PAP machine for example given white vinegar and water rinses. Other methods such as ozone treatment may not be as good as these simple methods to achieve cleaning.   3. Obesity (BMI 30.0-34.9) Obesity Counseling: Had a lengthy discussion regarding patients BMI and weight issues. Patient was instructed on portion control as well as increased activity. Also discussed caloric restrictions with trying to maintain intake less than 2000 Kcal. Discussions were made in accordance with the 5As of weight management. Simple actions such as not eating late and if able to, taking a walk is suggested.     General Counseling:  I have discussed the findings of the evaluation and examination with Terry Bonilla.  I have also discussed any further diagnostic evaluation thatmay be needed or ordered today. Terry Bonilla verbalizes understanding of the findings of todays visit. We also reviewed his medications today and discussed drug interactions and side effects including but not limited excessive drowsiness and altered mental states. We also discussed that there is always a risk not just to him but also people around him. he has been encouraged to call the office with any questions or concerns that should arise related to todays visit.  No orders of the defined types were placed in this encounter.       I have personally obtained a history, examined the patient, evaluated laboratory and imaging results, formulated the assessment and plan and placed orders. This patient was seen today by Tressie Ellis, PA-C in collaboration with Dr. Devona Konig.   Allyne Gee, MD [facility] Diplomate ABMS Pulmonary Critical Care Medicine and Sleep Medicine

## 2022-11-30 ENCOUNTER — Ambulatory Visit (INDEPENDENT_AMBULATORY_CARE_PROVIDER_SITE_OTHER): Payer: BC Managed Care – PPO | Admitting: Internal Medicine

## 2022-11-30 VITALS — BP 145/92 | HR 81 | Resp 14 | Ht 75.0 in | Wt 271.0 lb

## 2022-11-30 DIAGNOSIS — E669 Obesity, unspecified: Secondary | ICD-10-CM | POA: Diagnosis not present

## 2022-11-30 DIAGNOSIS — G4733 Obstructive sleep apnea (adult) (pediatric): Secondary | ICD-10-CM

## 2022-11-30 DIAGNOSIS — Z7189 Other specified counseling: Secondary | ICD-10-CM

## 2022-11-30 NOTE — Patient Instructions (Signed)

## 2022-12-01 DIAGNOSIS — N411 Chronic prostatitis: Secondary | ICD-10-CM | POA: Diagnosis not present

## 2022-12-01 DIAGNOSIS — R350 Frequency of micturition: Secondary | ICD-10-CM | POA: Diagnosis not present

## 2022-12-03 ENCOUNTER — Telehealth: Payer: Self-pay | Admitting: Internal Medicine

## 2022-12-03 NOTE — Telephone Encounter (Signed)
Patient called stating he was seen @ [facility] 11/30/22 for follow up after cpap set up. He is wondering why is scheduled again 12/07/22 for same thing. Melecia will call [facility]

## 2022-12-07 ENCOUNTER — Ambulatory Visit: Payer: BC Managed Care – PPO

## 2023-01-02 DIAGNOSIS — G4733 Obstructive sleep apnea (adult) (pediatric): Secondary | ICD-10-CM | POA: Diagnosis not present

## 2023-01-07 DIAGNOSIS — M545 Low back pain, unspecified: Secondary | ICD-10-CM | POA: Diagnosis not present

## 2023-01-07 DIAGNOSIS — R350 Frequency of micturition: Secondary | ICD-10-CM | POA: Diagnosis not present

## 2023-01-17 DIAGNOSIS — J209 Acute bronchitis, unspecified: Secondary | ICD-10-CM | POA: Diagnosis not present

## 2023-01-17 DIAGNOSIS — L2089 Other atopic dermatitis: Secondary | ICD-10-CM | POA: Diagnosis not present

## 2023-01-17 DIAGNOSIS — J0140 Acute pansinusitis, unspecified: Secondary | ICD-10-CM | POA: Diagnosis not present

## 2023-01-17 DIAGNOSIS — Z6832 Body mass index (BMI) 32.0-32.9, adult: Secondary | ICD-10-CM | POA: Diagnosis not present

## 2023-01-31 DIAGNOSIS — G4733 Obstructive sleep apnea (adult) (pediatric): Secondary | ICD-10-CM | POA: Diagnosis not present

## 2023-02-23 DIAGNOSIS — H40053 Ocular hypertension, bilateral: Secondary | ICD-10-CM | POA: Diagnosis not present

## 2023-02-23 LAB — HM DIABETES EYE EXAM

## 2023-02-24 ENCOUNTER — Encounter: Payer: Self-pay | Admitting: Internal Medicine

## 2023-03-03 DIAGNOSIS — R350 Frequency of micturition: Secondary | ICD-10-CM | POA: Diagnosis not present

## 2023-03-03 DIAGNOSIS — M545 Low back pain, unspecified: Secondary | ICD-10-CM | POA: Diagnosis not present

## 2023-03-03 DIAGNOSIS — G4733 Obstructive sleep apnea (adult) (pediatric): Secondary | ICD-10-CM | POA: Diagnosis not present

## 2023-03-05 ENCOUNTER — Encounter: Payer: Self-pay | Admitting: Internal Medicine

## 2023-03-05 ENCOUNTER — Ambulatory Visit: Payer: BC Managed Care – PPO | Admitting: Internal Medicine

## 2023-03-05 VITALS — BP 118/78 | HR 89 | Ht 75.0 in | Wt 265.0 lb

## 2023-03-05 DIAGNOSIS — E538 Deficiency of other specified B group vitamins: Secondary | ICD-10-CM

## 2023-03-05 DIAGNOSIS — E1140 Type 2 diabetes mellitus with diabetic neuropathy, unspecified: Secondary | ICD-10-CM | POA: Diagnosis not present

## 2023-03-05 DIAGNOSIS — E785 Hyperlipidemia, unspecified: Secondary | ICD-10-CM

## 2023-03-05 LAB — POCT GLYCOSYLATED HEMOGLOBIN (HGB A1C): Hemoglobin A1C: 6.2 % — AB (ref 4.0–5.6)

## 2023-03-05 NOTE — Addendum Note (Signed)
Addended by: Shelby Dubin on: 03/05/2023 10:22 AM   Modules accepted: Orders

## 2023-03-05 NOTE — Patient Instructions (Signed)
Please continue: - Metformin ER 1500 mg with dinner  Also, continue: - Vitamin B12 1000 mcg daily  Please return in 6 months with your sugar log.

## 2023-03-05 NOTE — Progress Notes (Signed)
Patient ID: [name], male   DOB: 07/13/1971, 52 y.o.   MRN: [id number]   HPI: Terry Bonilla is a 52 y.o.-year-old male, referred by his PCP, Dr. Doreene Bonilla, for management of DM2, dx on 10/15/2019, non-insulin-dependent, uncontrolled, with long-term complications (peripheral neuropathy).  Last visit 6 months ago.  Interim history: No increased urination, blurry vision, nausea, chest pain.   He had bronchitis x6 weeks - on Prednisone - sugars higher: 180-190s.  The  DM2: Reviewed history: He was diagnosed with diabetes in 09/2019 after he presented to the ED with a syncopal episode.  In ED, glucose was 372 and he had glycosuria on urinalysis.  HbA1c was high.  He was started on Metformin.  Reviewed HbA1c levels: Lab Results  Component Value Date   HGBA1C 6.2 (A) 09/03/2022   HGBA1C 5.6 09/01/2021   HGBA1C 5.7 (A) 04/11/2021   HGBA1C 6.7 (A) 12/13/2020   HGBA1C 6.8 (A) 08/05/2020   HGBA1C 6.6 (A) 04/01/2020   HGBA1C 6.4 (A) 01/02/2020   HGBA1C 10.0 (H) 10/23/2019   Pt is on a regimen of:  - Metformin ER 750 mg 1x a day >> 1500 mg with dinner >> lightheadedness >> 750 mg 2x a day >> 1500 mg at dinnertime We discussed about Rybelsus 3 mg before breakfast at last OV - 07/2020, but not started yet.  He had palpitations with an instant release Metformin.  Pt checks his sugars twice a day: - am: 108-144 >> 112-139, 144 >> 118, 126-138, 145 >> n/c - 2h after b'fast: n/c - before lunch: n/c - 2h after lunch: n/c - before dinner:  101-114 >> n/c >> 80, 91, 101-147, 156 >> 87-140, 143 (sick) - 2h after dinner: n/c - bedtime: 130, 141-169, 209 >> n/c - nighttime: n/c Lowest sugar was 130 >> ... 91 >> 80 >> 87; it is unclear at which level he has hypoglycemia awareness. Highest sugar was 209 >> .Marland KitchenMarland Kitchen  180 - Prednisone, OTW 156 >> 145.  Glucometer: [facility]  Feels improved after his diagnosis of diabetes: - Breakfast: [place] links >> protein bar - snack: bar - Lunch: chicken and  broccoli >> lean green lunch: greens + white meat - pm: nuts >> brownie - Dinner: chicken >> lean and green dinner - Snack after dinner:bar He stopped sweet drinks.  He drinks 1-2 alcoholic drinks at night.  -No CKD, last BUN/creatinine:  Lab Results  Component Value Date   BUN 15 11/26/2022   BUN 16 08/17/2022   CREATININE 0.81 11/26/2022   CREATININE 0.85 08/17/2022  We started lisinopril 5 mg daily in 08/2021. He increased to 10 mg daily 08/2022.  -+ HL; last set of lipids: Lab Results  Component Value Date   CHOL 128 08/17/2022   HDL 29.70 (L) 08/17/2022   LDLCALC 73 08/17/2022   LDLDIRECT 86.0 08/19/2021   TRIG 126.0 08/17/2022   CHOLHDL 4 08/17/2022  On Lipitor 10.  - last eye exam was in 02/2023: No DR reortedly (Dr. Fredrich Bonilla). He is on Travaprost for increased pressure.  -+ Numbness and tingling in his feet. He saw by podiatry for an ingrown toenail.  He had surgery x 2.  Last visit with Dr. Ardelle Bonilla 01/05/2022.  He had onychodystrophy, ingrown toenails. Back on ALA.  Pt has FH of DM in elder brother.  Vitamin B12 deficiency:  We started B12 injections.  His shooting pain in his legs improved after starting this.  We were able to transition him to 1000 mcg  p.o. B12.  Reviewed his B12 levels: Lab Results  Component Value Date   VITAMINB12 888 02/12/2022   VITAMINB12 741 08/19/2021   VITAMINB12 790 11/19/2020   VITAMINB12 473 08/02/2020   VITAMINB12 389 05/14/2020   VITAMINB12 196 (L) 01/30/2020   Allergic to sulfa.  She was on alfuzosin and meloxicam for prostatitis in the past.  He feels that these medications improved his dizziness.  He is now off these medications and dizziness has not recurred. He also has a history of alcohol use and transaminitis. LFTs were normal in 07/2021.  ROS: + See HPI  I reviewed pt's medications, allergies, PMH, social hx, family hx, and changes were documented in the history of present illness. Otherwise, unchanged from my  initial visit note.  Past Medical History:  Diagnosis Date   Diabetes mellitus without complication (HCC) 09/2019   pre-diabetes A1C 6.4   Sleep apnea 2009   cpap   Substance abuse (HCC)    ETOH   Past Surgical History:  Procedure Laterality Date   KIDNEY SURGERY Left 08/09/2020   benign cyst laser removal    TONSILLECTOMY     age 28   Social History   Socioeconomic History   Marital status: Single    Spouse name: Not on file   Number of children: Not on file   Years of education: Not on file   Highest education level: Not on file  Occupational History   Not on file  Tobacco Use   Smoking status: Never   Smokeless tobacco: Never  Vaping Use   Vaping Use: Never used  Substance and Sexual Activity   Alcohol use: Yes    Alcohol/week: 4.0 - 6.0 standard drinks of alcohol    Types: 4 - 6 Standard drinks or equivalent per week    Comment: two drinks nightly occasionally    Drug use: Never   Sexual activity: Not on file  Other Topics Concern   Not on file  Social History Narrative   Lives alone   Right handed   Caffeine: 1 cup/day   Social Determinants of Health   Financial Resource Strain: Not on file  Food Insecurity: Not on file  Transportation Needs: Not on file  Physical Activity: Not on file  Stress: Not on file  Social Connections: Not on file  Intimate Partner Violence: Not on file   Current Outpatient Medications on File Prior to Visit  Medication Sig Dispense Refill   alfuzosin (UROXATRAL) 10 MG 24 hr tablet Take 10 mg by mouth daily.     ammonium lactate (AMLACTIN) 12 % cream Apply topically as needed for dry skin. 385 g 0   atorvastatin (LIPITOR) 10 MG tablet TAKE 1 TABLET BY MOUTH EVERY DAY IN THE EVENING 90 tablet 3   betamethasone dipropionate 0.05 % cream Apply 1 application topically as needed.     Blood Glucose Calibration (ASCENSIA CONTOUR VI) by In Vitro route.     Blood Glucose Monitoring Suppl (FREESTYLE FREEDOM LITE) w/Device KIT 1 kit by  Does not apply route as needed. 1 kit 0   finasteride (PROPECIA) 1 MG tablet Take 1 tablet by mouth daily.     fluticasone (FLONASE) 50 MCG/ACT nasal spray Place 2 sprays into both nostrils daily. 16 g 6   Glucose Blood (ASCENSIA CONTOUR TEST VI) 100 each by In Vitro route 1 day or 1 dose.     Lancets (FREESTYLE) lancets USE AS DIRECTED AS NEEDED 100 each 0   latanoprost (XALATAN) 0.005 %  ophthalmic solution 1 drop at bedtime.     lisinopril (ZESTRIL) 10 MG tablet Take 1 tablet (10 mg total) by mouth daily. 90 tablet 3   metFORMIN (GLUCOPHAGE-XR) 750 MG 24 hr tablet Take 1500 mg by mouth with dinner 180 tablet 3   triamcinolone (KENALOG) 0.025 % ointment Apply 1 application topically 2 (two) times daily. Not for face or private areas. 80 g 0   vitamin B-12 (CYANOCOBALAMIN) 1000 MCG tablet Take 1,000 mcg by mouth daily.     No current facility-administered medications on file prior to visit.   Allergies  Allergen Reactions   Crab [Shellfish Allergy] Hives   Elemental Sulfur Rash   Sulfa Antibiotics Hives   PMH: -Pertinent PMH reviewed in HPI  PE: BP 118/78   Pulse 89   Ht 6\' 3"  (1.905 m)   Wt 265 lb (120.2 kg)   SpO2 99%   BMI 33.12 kg/m  Wt Readings from Last 3 Encounters:  03/05/23 265 lb (120.2 kg)  11/30/22 271 lb (122.9 kg)  11/26/22 269 lb 9.6 oz (122.3 kg)   Constitutional: overweight, in NAD Eyes:  EOMI, no exophthalmos ENT: no neck masses, no cervical lymphadenopathy Cardiovascular: RRR, No MRG Respiratory: CTA B Musculoskeletal: no deformities Skin:no rashes Neurological: no tremor with outstretched hands Diabetic Foot Exam - Simple   Simple Foot Form Diabetic Foot exam was performed with the following findings: Yes 03/05/2023  9:04 AM  Visual Inspection No deformities, no ulcerations, no other skin breakdown bilaterally: Yes Sensation Testing Intact to touch and monofilament testing bilaterally: Yes Pulse Check Posterior Tibialis and Dorsalis pulse intact  bilaterally: Yes Comments    ASSESSMENT: 1. DM2, non-insulin-dependent, uncontrolled, with complications - PN  He does not have a family history of medullary thyroid cancer or personal history of pancreatitis.  2. HL  3.  Vitamin B12 deficiency  PLAN:  1. Patient with history of type 2 diabetes diagnosed after syncopal episode in 09/2019.  He was initially on metformin IR, which he could not tolerate due to palpitations.  He is currently doing well on metformin ER.  He saw nutrition and changed his diet and also stopped drinking sweet drinks, with subsequent improvement in his blood sugars.  Nadir HbA1c has been 5.6%, blood sugars were higher at last visit and HbA1c was slightly higher, at 6.2%, after being on prednisone for URI.  He still had some dietary indiscretions, which I advised him to reduce: For example milk and a snack in the afternoon.  He saw that going for a walk in the afternoon helped his blood sugars and he was planning to do this consistently.  We did not change the regimen at last visit. -At today's visit, he returns after another period of time when he had bronchitis for 6 weeks and during which she had steroids and sugars were higher, but they have improved.  As of now, they are almost all at goal, but he is only checking before dinner.  I advised him to rotate blood sugar checks, but for now, I do not feel we need to change his regimen. - I suggested to:  Patient Instructions  Please continue: - Metformin ER 1500 mg with dinner  Also, continue: - Vitamin B12 1000 mcg daily  Please return in 6 months with your sugar log.   - we checked his HbA1c: 6.2% (stable, at goal) - advised to check sugars at different times of the day - 1x a day, rotating check times - advised for yearly  eye exams >> he is UTD - return to clinic in 6 months  2. HL -Reviewed latest lipid panel from 07/2022: Fractions at goal with exception of a low HDL: Lab Results  Component Value Date    CHOL 128 08/17/2022   HDL 29.70 (L) 08/17/2022   LDLCALC 73 08/17/2022   LDLDIRECT 86.0 08/19/2021   TRIG 126.0 08/17/2022   CHOLHDL 4 08/17/2022  -He continues Lipitor 10 mg daily without side effects  3.  Vitamin B12 deficiency -He was initially on B12 injections but then switched to B12 1000 mcg daily -Tingling improved after starting B12 but he still has some tingling in the tips of his fingers >> I suggested alpha-lipoic acid 600 mg twice a day -B12 level have been normal: 741 (07/2021) and 888 (01/2022) -At last visit, he still had neuropathy symptoms and I recommended Benfotiamine 300 mg twice a day.  He tried this but it did not help.  He continues alpha-lipoic acid. -He has mild numbness in toes -Foot exam was normal today   Terry Pavlov, MD PhD [facility] Endocrinology

## 2023-04-02 DIAGNOSIS — G4733 Obstructive sleep apnea (adult) (pediatric): Secondary | ICD-10-CM | POA: Diagnosis not present

## 2023-05-03 DIAGNOSIS — G4733 Obstructive sleep apnea (adult) (pediatric): Secondary | ICD-10-CM | POA: Diagnosis not present

## 2023-05-22 ENCOUNTER — Other Ambulatory Visit: Payer: Self-pay | Admitting: Family Medicine

## 2023-05-22 DIAGNOSIS — I10 Essential (primary) hypertension: Secondary | ICD-10-CM

## 2023-06-02 ENCOUNTER — Ambulatory Visit: Payer: BC Managed Care – PPO | Admitting: Family Medicine

## 2023-06-02 DIAGNOSIS — G4733 Obstructive sleep apnea (adult) (pediatric): Secondary | ICD-10-CM | POA: Diagnosis not present

## 2023-06-08 ENCOUNTER — Ambulatory Visit: Payer: BC Managed Care – PPO | Admitting: Family Medicine

## 2023-06-08 ENCOUNTER — Encounter: Payer: Self-pay | Admitting: Family Medicine

## 2023-06-08 VITALS — BP 134/82 | HR 89 | Temp 97.4°F | Ht 75.0 in | Wt 267.0 lb

## 2023-06-08 DIAGNOSIS — I10 Essential (primary) hypertension: Secondary | ICD-10-CM | POA: Diagnosis not present

## 2023-06-08 DIAGNOSIS — Z125 Encounter for screening for malignant neoplasm of prostate: Secondary | ICD-10-CM

## 2023-06-08 DIAGNOSIS — E7800 Pure hypercholesterolemia, unspecified: Secondary | ICD-10-CM | POA: Diagnosis not present

## 2023-06-08 DIAGNOSIS — Z0000 Encounter for general adult medical examination without abnormal findings: Secondary | ICD-10-CM | POA: Diagnosis not present

## 2023-06-08 DIAGNOSIS — Z23 Encounter for immunization: Secondary | ICD-10-CM | POA: Insufficient documentation

## 2023-06-08 LAB — LIPID PANEL
Cholesterol: 119 mg/dL (ref 0–200)
HDL: 28.2 mg/dL — ABNORMAL LOW (ref >39.00)
LDL Cholesterol: 59 mg/dL (ref 0–99)
NonHDL: 91.2
Total CHOL/HDL Ratio: 4
Triglycerides: 162 mg/dL — ABNORMAL HIGH (ref 0.0–149.0)
VLDL: 32.4 mg/dL (ref 0.0–40.0)

## 2023-06-08 LAB — COMPREHENSIVE METABOLIC PANEL
ALT: 35 U/L (ref 0–53)
AST: 25 U/L (ref 0–37)
Albumin: 4.5 g/dL (ref 3.5–5.2)
Alkaline Phosphatase: 58 U/L (ref 39–117)
BUN: 17 mg/dL (ref 6–23)
CO2: 24 mEq/L (ref 19–32)
Calcium: 9.4 mg/dL (ref 8.4–10.5)
Chloride: 102 mEq/L (ref 96–112)
Creatinine, Ser: 0.84 mg/dL (ref 0.40–1.50)
GFR: 99.08 mL/min (ref >60.00)
Glucose, Bld: 136 mg/dL — ABNORMAL HIGH (ref 70–99)
Potassium: 3.9 mEq/L (ref 3.5–5.1)
Sodium: 135 mEq/L (ref 135–145)
Total Bilirubin: 0.9 mg/dL (ref 0.2–1.2)
Total Protein: 6.7 g/dL (ref 6.0–8.3)

## 2023-06-08 LAB — CBC WITH DIFFERENTIAL/PLATELET
Basophils Absolute: 0.1 10*3/uL (ref 0.0–0.1)
Basophils Relative: 1.3 % (ref 0.0–3.0)
Eosinophils Absolute: 0.1 10*3/uL (ref 0.0–0.7)
Eosinophils Relative: 2.5 % (ref 0.0–5.0)
HCT: 43.7 % (ref 39.0–52.0)
Hemoglobin: 15.1 g/dL (ref 13.0–17.0)
Lymphocytes Relative: 27.7 % (ref 12.0–46.0)
Lymphs Abs: 1.1 10*3/uL (ref 0.7–4.0)
MCHC: 34.5 g/dL (ref 30.0–36.0)
MCV: 92.1 fl (ref 78.0–100.0)
Monocytes Absolute: 0.3 10*3/uL (ref 0.1–1.0)
Monocytes Relative: 7.4 % (ref 3.0–12.0)
Neutro Abs: 2.5 10*3/uL (ref 1.4–7.7)
Neutrophils Relative %: 61.1 % (ref 43.0–77.0)
Platelets: 160 10*3/uL (ref 150.0–400.0)
RBC: 4.74 Mil/uL (ref 4.22–5.81)
RDW: 12.4 % (ref 11.5–15.5)
WBC: 4.1 10*3/uL (ref 4.0–10.5)

## 2023-06-08 LAB — MICROALBUMIN / CREATININE URINE RATIO
Creatinine,U: 231.3 mg/dL
Microalb Creat Ratio: 0.5 mg/g (ref 0.0–30.0)
Microalb, Ur: 1.2 mg/dL (ref 0.0–1.9)

## 2023-06-08 LAB — PSA: PSA: 0.33 ng/mL (ref 0.10–4.00)

## 2023-06-08 NOTE — Progress Notes (Signed)
Established Patient Office Visit   Subjective:  Patient ID: Terry Bonilla, male    DOB: December 21, 1970  Age: 52 y.o. MRN: [id number]  Chief Complaint  Patient presents with   Annual Exam    CPE. Pt is fasting.     HPI Encounter Diagnoses  Name Primary?   Elevated LDL cholesterol level Yes   Essential hypertension    Healthcare maintenance    For his yearly physical and follow-up of above.  He has been doing well.  He has regular dental care twice yearly.  Due for colonoscopy.  He has a Armed forces operational officer he sees.  He exercises a few days a week by walking and plays golf regularly.  He lives alone independently.  Continues follow-up with endocrinology for his diabetes.  Blood pressure is controlled with lisinopril 10 mg.  Cholesterol well-controlled with atorvastatin 10 mg.  He has been using his CPAP nightly.   Review of Systems  Constitutional: Negative.   HENT: Negative.    Eyes:  Negative for blurred vision, discharge and redness.  Respiratory: Negative.    Cardiovascular: Negative.   Gastrointestinal:  Negative for abdominal pain.  Genitourinary: Negative.   Musculoskeletal: Negative.  Negative for myalgias.  Skin:  Negative for rash.  Neurological:  Negative for tingling, loss of consciousness and weakness.  Endo/Heme/Allergies:  Negative for polydipsia.      06/08/2023    8:57 AM 11/26/2022    8:57 AM 08/17/2022    8:49 AM  Depression screen PHQ 2/9  Decreased Interest 0 0 0  Down, Depressed, Hopeless 0 0 0  PHQ - 2 Score 0 0 0       Current Outpatient Medications:    albuterol (VENTOLIN HFA) 108 (90 Base) MCG/ACT inhaler, Inhale 1-2 puffs into the lungs every 6 (six) hours as needed., Disp: , Rfl:    alfuzosin (UROXATRAL) 10 MG 24 hr tablet, Take 10 mg by mouth daily., Disp: , Rfl:    ammonium lactate (AMLACTIN) 12 % cream, Apply topically as needed for dry skin., Disp: 385 g, Rfl: 0   atorvastatin (LIPITOR) 10 MG tablet, TAKE 1 TABLET BY MOUTH EVERY DAY IN THE  EVENING, Disp: 90 tablet, Rfl: 3   betamethasone dipropionate 0.05 % cream, Apply 1 application topically as needed., Disp: , Rfl:    Blood Glucose Calibration (ASCENSIA CONTOUR VI), by In Vitro route., Disp: , Rfl:    Blood Glucose Monitoring Suppl (FREESTYLE FREEDOM LITE) w/Device KIT, 1 kit by Does not apply route as needed., Disp: 1 kit, Rfl: 0   finasteride (PROPECIA) 1 MG tablet, Take 1 tablet by mouth daily., Disp: , Rfl:    fluticasone (FLONASE) 50 MCG/ACT nasal spray, Place 2 sprays into both nostrils daily., Disp: 16 g, Rfl: 6   Glucose Blood (ASCENSIA CONTOUR TEST VI), 100 each by In Vitro route 1 day or 1 dose., Disp: , Rfl:    Lancets (FREESTYLE) lancets, USE AS DIRECTED AS NEEDED, Disp: 100 each, Rfl: 0   latanoprost (XALATAN) 0.005 % ophthalmic solution, 1 drop at bedtime., Disp: , Rfl:    lisinopril (ZESTRIL) 10 MG tablet, TAKE 1 TABLET BY MOUTH EVERY DAY, Disp: 90 tablet, Rfl: 3   metFORMIN (GLUCOPHAGE-XR) 750 MG 24 hr tablet, Take 1500 mg by mouth with dinner, Disp: 180 tablet, Rfl: 3   triamcinolone (KENALOG) 0.025 % ointment, Apply 1 application topically 2 (two) times daily. Not for face or private areas., Disp: 80 g, Rfl: 0   vitamin B-12 (CYANOCOBALAMIN) 1000 MCG  tablet, Take 1,000 mcg by mouth daily., Disp: , Rfl:    Objective:     BP 134/82   Pulse 89   Temp (!) 97.4 F (36.3 C)   Ht 6\' 3"  (1.905 m)   Wt 267 lb (121.1 kg)   SpO2 99%   BMI 33.37 kg/m  BP Readings from Last 3 Encounters:  06/08/23 134/82  03/05/23 118/78  11/30/22 (!) 145/92   Wt Readings from Last 3 Encounters:  06/08/23 267 lb (121.1 kg)  03/05/23 265 lb (120.2 kg)  11/30/22 271 lb (122.9 kg)      Physical Exam Constitutional:      General: He is not in acute distress.    Appearance: Normal appearance. He is not ill-appearing, toxic-appearing or diaphoretic.  HENT:     Head: Normocephalic and atraumatic.     Right Ear: Tympanic membrane, ear canal and external ear normal.     Left  Ear: Tympanic membrane, ear canal and external ear normal.     Mouth/Throat:     Mouth: Mucous membranes are moist.     Pharynx: Oropharynx is clear. No oropharyngeal exudate or posterior oropharyngeal erythema.  Eyes:     General: No scleral icterus.       Right eye: No discharge.        Left eye: No discharge.     Extraocular Movements: Extraocular movements intact.     Conjunctiva/sclera: Conjunctivae normal.     Pupils: Pupils are equal, round, and reactive to light.  Cardiovascular:     Rate and Rhythm: Normal rate and regular rhythm.  Pulmonary:     Effort: Pulmonary effort is normal. No respiratory distress.     Breath sounds: Normal breath sounds. No wheezing, rhonchi or rales.  Abdominal:     General: Bowel sounds are normal.     Tenderness: There is no abdominal tenderness. There is no guarding or rebound.  Musculoskeletal:     Cervical back: No rigidity or tenderness.  Lymphadenopathy:     Cervical: No cervical adenopathy.  Skin:    General: Skin is warm and dry.  Neurological:     Mental Status: He is alert and oriented to person, place, and time.  Psychiatric:        Mood and Affect: Mood normal.        Behavior: Behavior normal.      No results found for any visits on 06/08/23.    The ASCVD Risk score (Arnett DK, et al., 2019) failed to calculate for the following reasons:   The valid total cholesterol range is 130 to 320 mg/dL    Assessment & Plan:   Elevated LDL cholesterol level -     Comprehensive metabolic panel -     Lipid panel  Essential hypertension -     CBC with Differential/Platelet -     Comprehensive metabolic panel -     Urinalysis, Routine w reflex microscopic; Future -     Microalbumin / creatinine urine ratio  Healthcare maintenance -     PSA    Return in about 1 year (around 06/07/2024), or if symptoms worsen or fail to improve.  Patient will schedule follow-up with GI for colonoscopy that is due.  He will also follow-up with  his dermatologist for mole check.  He is scheduled Zostavax vaccine with his pharmacy.  Continue medications as above.  Information was given on health maintenance disease prevention and exercising to lose weight.  Recommended 30 minutes of exercise most  days of the week.  Encouraged ongoing weight loss efforts.  Mliss Sax, MD

## 2023-06-09 ENCOUNTER — Encounter: Payer: Self-pay | Admitting: Gastroenterology

## 2023-06-25 ENCOUNTER — Encounter: Payer: Self-pay | Admitting: Family Medicine

## 2023-06-29 DIAGNOSIS — H40053 Ocular hypertension, bilateral: Secondary | ICD-10-CM | POA: Diagnosis not present

## 2023-07-03 DIAGNOSIS — G4733 Obstructive sleep apnea (adult) (pediatric): Secondary | ICD-10-CM | POA: Diagnosis not present

## 2023-07-07 ENCOUNTER — Other Ambulatory Visit (INDEPENDENT_AMBULATORY_CARE_PROVIDER_SITE_OTHER): Payer: BC Managed Care – PPO

## 2023-07-07 DIAGNOSIS — I10 Essential (primary) hypertension: Secondary | ICD-10-CM

## 2023-07-07 LAB — URINALYSIS, ROUTINE W REFLEX MICROSCOPIC
Bilirubin Urine: NEGATIVE
Ketones, ur: NEGATIVE
Leukocytes,Ua: NEGATIVE
Nitrite: NEGATIVE
Specific Gravity, Urine: 1.025 (ref 1.000–1.030)
Total Protein, Urine: NEGATIVE
Urine Glucose: NEGATIVE
Urobilinogen, UA: 0.2 (ref 0.0–1.0)
pH: 6 (ref 5.0–8.0)

## 2023-07-12 DIAGNOSIS — M25561 Pain in right knee: Secondary | ICD-10-CM | POA: Diagnosis not present

## 2023-07-28 DIAGNOSIS — M2242 Chondromalacia patellae, left knee: Secondary | ICD-10-CM | POA: Diagnosis not present

## 2023-07-28 DIAGNOSIS — M2241 Chondromalacia patellae, right knee: Secondary | ICD-10-CM | POA: Diagnosis not present

## 2023-07-29 DIAGNOSIS — M2241 Chondromalacia patellae, right knee: Secondary | ICD-10-CM | POA: Diagnosis not present

## 2023-07-29 DIAGNOSIS — M2242 Chondromalacia patellae, left knee: Secondary | ICD-10-CM | POA: Diagnosis not present

## 2023-07-29 DIAGNOSIS — M25561 Pain in right knee: Secondary | ICD-10-CM | POA: Diagnosis not present

## 2023-08-03 DIAGNOSIS — G4733 Obstructive sleep apnea (adult) (pediatric): Secondary | ICD-10-CM | POA: Diagnosis not present

## 2023-08-10 ENCOUNTER — Encounter: Payer: Self-pay | Admitting: Gastroenterology

## 2023-08-10 ENCOUNTER — Ambulatory Visit: Payer: BC Managed Care – PPO

## 2023-08-10 VITALS — Ht 75.0 in | Wt 265.0 lb

## 2023-08-10 DIAGNOSIS — Z86010 Personal history of colonic polyps: Secondary | ICD-10-CM

## 2023-08-10 MED ORDER — NA SULFATE-K SULFATE-MG SULF 17.5-3.13-1.6 GM/177ML PO SOLN
1.0000 | Freq: Once | ORAL | 0 refills | Status: AC
Start: 2023-08-10 — End: 2023-08-10

## 2023-08-10 NOTE — Progress Notes (Signed)
No egg or soy allergy known to patient  No issues known to pt with past sedation with any surgeries or procedures: PONV  Patient denies ever being told they had issues or difficulty with intubation  No FH of Malignant Hyperthermia Pt is not on diet pills Pt is not on  home 02  Pt is not on blood thinners  Pt denies issues with constipation  No A fib or A flutter Have any cardiac testing pending--no  LOA: independent  Prep: Suprep  Patient's chart reviewed by Cathlyn Parsons CNRA prior to previsit and patient appropriate for the [facility].  Previsit completed and red dot placed by patient's name on their procedure day (on provider's schedule).     PV competed with patient. Prep instructions sent via mychart and home address. Goodrx coupon for [facility] provided to use for price reduction if needed.

## 2023-08-12 DIAGNOSIS — L738 Other specified follicular disorders: Secondary | ICD-10-CM | POA: Diagnosis not present

## 2023-08-12 DIAGNOSIS — D225 Melanocytic nevi of trunk: Secondary | ICD-10-CM | POA: Diagnosis not present

## 2023-08-12 DIAGNOSIS — L718 Other rosacea: Secondary | ICD-10-CM | POA: Diagnosis not present

## 2023-08-12 DIAGNOSIS — D2262 Melanocytic nevi of left upper limb, including shoulder: Secondary | ICD-10-CM | POA: Diagnosis not present

## 2023-08-12 DIAGNOSIS — D485 Neoplasm of uncertain behavior of skin: Secondary | ICD-10-CM | POA: Diagnosis not present

## 2023-08-12 DIAGNOSIS — D1809 Hemangioma of other sites: Secondary | ICD-10-CM | POA: Diagnosis not present

## 2023-08-24 ENCOUNTER — Encounter: Payer: Self-pay | Admitting: Gastroenterology

## 2023-08-24 ENCOUNTER — Ambulatory Visit: Payer: BC Managed Care – PPO | Admitting: Gastroenterology

## 2023-08-24 VITALS — BP 136/84 | HR 68 | Temp 98.5°F | Resp 14 | Ht 75.0 in | Wt 265.0 lb

## 2023-08-24 DIAGNOSIS — K635 Polyp of colon: Secondary | ICD-10-CM | POA: Diagnosis not present

## 2023-08-24 DIAGNOSIS — D124 Benign neoplasm of descending colon: Secondary | ICD-10-CM | POA: Diagnosis not present

## 2023-08-24 DIAGNOSIS — D123 Benign neoplasm of transverse colon: Secondary | ICD-10-CM | POA: Diagnosis not present

## 2023-08-24 DIAGNOSIS — Z1211 Encounter for screening for malignant neoplasm of colon: Secondary | ICD-10-CM | POA: Diagnosis not present

## 2023-08-24 DIAGNOSIS — Z09 Encounter for follow-up examination after completed treatment for conditions other than malignant neoplasm: Secondary | ICD-10-CM

## 2023-08-24 DIAGNOSIS — Z860100 Personal history of colon polyps, unspecified: Secondary | ICD-10-CM

## 2023-08-24 MED ORDER — SODIUM CHLORIDE 0.9 % IV SOLN: 500.0000 mL | Freq: Once | INTRAVENOUS | Status: AC

## 2023-08-24 NOTE — Progress Notes (Signed)
Called to room to assist during endoscopic procedure.  Patient ID and intended procedure confirmed with present staff. Received instructions for my participation in the procedure from the performing physician.  

## 2023-08-24 NOTE — Patient Instructions (Signed)
YOU HAD AN ENDOSCOPIC PROCEDURE TODAY AT THE [facility]:   Refer to the procedure report that was given to you for any specific questions about what was found during the examination.  If the procedure report does not answer your questions, please call your gastroenterologist to clarify.  If you requested that your care partner not be given the details of your procedure findings, then the procedure report has been included in a sealed envelope for you to review at your convenience later.  YOU SHOULD EXPECT: Some feelings of bloating in the abdomen. Passage of more gas than usual.  Walking can help get rid of the air that was put into your GI tract during the procedure and reduce the bloating. If you had a lower endoscopy (such as a colonoscopy or flexible sigmoidoscopy) you may notice spotting of blood in your stool or on the toilet paper. If you underwent a bowel prep for your procedure, you may not have a normal bowel movement for a few days.  Please Note:  You might notice some irritation and congestion in your nose or some drainage.  This is from the oxygen used during your procedure.  There is no need for concern and it should clear up in a day or so.  SYMPTOMS TO REPORT IMMEDIATELY:  Following lower endoscopy (colonoscopy or flexible sigmoidoscopy):  Excessive amounts of blood in the stool  Significant tenderness or worsening of abdominal pains  Swelling of the abdomen that is new, acute  Fever of 100F or higher   For urgent or emergent issues, a gastroenterologist can be reached at any hour by calling [phone]. Do not use MyChart messaging for urgent concerns.    DIET:  We do recommend a small meal at first, but then you may proceed to your regular diet.  Drink plenty of fluids but you should avoid alcoholic beverages for 24 hours.  MEDICATIONS: Continue present medications.  FOLLOW UP: Await pathology results. Repeat colonoscopy for surveillance based on pathology  results.  Please see handouts given to you by your recovery nurse: Polyps, Hemorrhoids.  Thank you for allowing [name] to provide for your healthcare needs today.   ACTIVITY:  You should plan to take it easy for the rest of today and you should NOT DRIVE or use heavy machinery until tomorrow (because of the sedation medicines used during the test).    FOLLOW UP: Our staff will call the number listed on your records the next business day following your procedure.  We will call around 7:15- 8:00 am to check on you and address any questions or concerns that you may have regarding the information given to you following your procedure. If we do not reach you, we will leave a message.     If any biopsies were taken you will be contacted by phone or by letter within the next 1-3 weeks.  Please call us at [phone] if you have not heard about the biopsies in 3 weeks.    SIGNATURES/CONFIDENTIALITY: You and/or your care partner have signed paperwork which will be entered into your electronic medical record.  These signatures attest to the fact that that the information above on your After Visit Summary has been reviewed and is understood.  Full responsibility of the confidentiality of this discharge information lies with you and/or your care-partner.

## 2023-08-24 NOTE — Progress Notes (Signed)
Report to PACU, RN, vss, BBS= Clear.  

## 2023-08-24 NOTE — Progress Notes (Signed)
[facility] History and Physical   Primary Care Physician:  Mliss Sax, MD   Reason for Procedure:   History of polyps  Plan:     colonoscopy     HPI: Terry Bonilla is a 52 y.o. male    Past Medical History:  Diagnosis Date   Diabetes mellitus without complication (HCC) 09/2019   pre-diabetes A1C 6.4   Hypertension    Sleep apnea 2009   cpap   Substance abuse (HCC)    ETOH    Past Surgical History:  Procedure Laterality Date   KIDNEY SURGERY Left 08/09/2020   benign cyst laser removal    TONSILLECTOMY     age 74    Prior to Admission medications   Medication Sig Start Date End Date Taking? Authorizing Provider  atorvastatin (LIPITOR) 10 MG tablet TAKE 1 TABLET BY MOUTH EVERY DAY IN THE EVENING 11/26/22  Yes Mliss Sax, MD  Blood Glucose Calibration (ASCENSIA CONTOUR VI) by In Vitro route.   Yes [provider]  Blood Glucose Monitoring Suppl (FREESTYLE FREEDOM LITE) w/Device KIT 1 kit by Does not apply route as needed. 07/23/20  Yes Mliss Sax, MD  finasteride (PROPECIA) 1 MG tablet Take 1 tablet by mouth daily. 12/11/13  Yes [provider]  Glucose Blood (ASCENSIA CONTOUR TEST VI) 100 each by In Vitro route 1 day or 1 dose.   Yes [provider]  Lancets (FREESTYLE) lancets USE AS DIRECTED AS NEEDED 11/18/20  Yes Mliss Sax, MD  latanoprost (XALATAN) 0.005 % ophthalmic solution 1 drop at bedtime. 07/23/21  Yes [provider]  lisinopril (ZESTRIL) 10 MG tablet TAKE 1 TABLET BY MOUTH EVERY DAY 05/24/23  Yes Mliss Sax, MD  metFORMIN (GLUCOPHAGE-XR) 750 MG 24 hr tablet Take 1500 mg by mouth with dinner 09/03/22  Yes Carlus Pavlov, MD  vitamin B-12 (CYANOCOBALAMIN) 1000 MCG tablet Take 1,000 mcg by mouth daily.   Yes [provider]  albuterol (VENTOLIN HFA) 108 (90 Base) MCG/ACT inhaler Inhale 1-2 puffs into the lungs every 6 (six) hours as needed. 01/17/23    [provider]  alfuzosin (UROXATRAL) 10 MG 24 hr tablet Take 10 mg by mouth daily. 03/05/22   [provider]  Alpha Lipoic Acid 200 MG CAPS Take 1,200 mg by mouth daily.    [provider]  ammonium lactate (AMLACTIN) 12 % cream Apply topically as needed for dry skin. 02/29/20   Vivi Barrack, DPM  betamethasone dipropionate 0.05 % cream Apply 1 application topically as needed. 02/04/17   [provider]  doxycycline (MONODOX) 100 MG capsule Take 100 mg by mouth daily. 08/12/23   [provider]  fluticasone (FLONASE) 50 MCG/ACT nasal spray Place 2 sprays into both nostrils daily. 08/17/22   Mliss Sax, MD  triamcinolone (KENALOG) 0.025 % ointment Apply 1 application topically 2 (two) times daily. Not for face or private areas. 01/26/20   Mliss Sax, MD    Current Outpatient Medications  Medication Sig Dispense Refill   atorvastatin (LIPITOR) 10 MG tablet TAKE 1 TABLET BY MOUTH EVERY DAY IN THE EVENING 90 tablet 3   Blood Glucose Calibration (ASCENSIA CONTOUR VI) by In Vitro route.     Blood Glucose Monitoring Suppl (FREESTYLE FREEDOM LITE) w/Device KIT 1 kit by Does not apply route as needed. 1 kit 0   finasteride (PROPECIA) 1 MG tablet Take 1 tablet by mouth daily.     Glucose Blood (ASCENSIA CONTOUR  TEST VI) 100 each by In Vitro route 1 day or 1 dose.     Lancets (FREESTYLE) lancets USE AS DIRECTED AS NEEDED 100 each 0   latanoprost (XALATAN) 0.005 % ophthalmic solution 1 drop at bedtime.     lisinopril (ZESTRIL) 10 MG tablet TAKE 1 TABLET BY MOUTH EVERY DAY 90 tablet 3   metFORMIN (GLUCOPHAGE-XR) 750 MG 24 hr tablet Take 1500 mg by mouth with dinner 180 tablet 3   vitamin B-12 (CYANOCOBALAMIN) 1000 MCG tablet Take 1,000 mcg by mouth daily.     albuterol (VENTOLIN HFA) 108 (90 Base) MCG/ACT inhaler Inhale 1-2 puffs into the lungs every 6 (six) hours as needed.     alfuzosin (UROXATRAL) 10 MG 24 hr tablet Take 10 mg by  mouth daily.     Alpha Lipoic Acid 200 MG CAPS Take 1,200 mg by mouth daily.     ammonium lactate (AMLACTIN) 12 % cream Apply topically as needed for dry skin. 385 g 0   betamethasone dipropionate 0.05 % cream Apply 1 application topically as needed.     doxycycline (MONODOX) 100 MG capsule Take 100 mg by mouth daily.     fluticasone (FLONASE) 50 MCG/ACT nasal spray Place 2 sprays into both nostrils daily. 16 g 6   triamcinolone (KENALOG) 0.025 % ointment Apply 1 application topically 2 (two) times daily. Not for face or private areas. 80 g 0   Current Facility-Administered Medications  Medication Dose Route Frequency Provider Last Rate Last Admin   0.9 %  sodium chloride infusion  500 mL Intravenous Once Lynann Bologna, MD        Allergies as of 08/24/2023 - Review Complete 08/24/2023  Allergen Reaction Noted   Celecoxib Hives 08/10/2023   Crab [shellfish allergy] Hives 10/18/2019   Elemental sulfur Rash 03/20/2016   Sulfa antibiotics Hives 02/06/2014    Family History  Problem Relation Age of Onset   Ovarian cancer Mother    Arrhythmia Father    Diabetes type II Brother    Colon cancer Neg Hx    Colon polyps Neg Hx    Esophageal cancer Neg Hx    Rectal cancer Neg Hx    Stomach cancer Neg Hx     Social History   Socioeconomic History   Marital status: Single    Spouse name: Not on file   Number of children: Not on file   Years of education: Not on file   Highest education level: Not on file  Occupational History   Not on file  Tobacco Use   Smoking status: Never   Smokeless tobacco: Never  Vaping Use   Vaping status: Never Used  Substance and Sexual Activity   Alcohol use: Yes    Alcohol/week: 4.0 - 6.0 standard drinks of alcohol    Types: 4 - 6 Standard drinks or equivalent per week    Comment: two drinks nightly occasionally    Drug use: Never   Sexual activity: Not on file  Other Topics Concern   Not on file  Social History Narrative   Lives alone   Right  handed   Caffeine: 1 cup/day   Social Determinants of Health   Financial Resource Strain: Not on file  Food Insecurity: Not on file  Transportation Needs: Not on file  Physical Activity: Not on file  Stress: Not on file  Social Connections: Not on file  Intimate Partner Violence: Not on file    Review of Systems: Positive for  none All other  review of systems negative except as mentioned in the HPI.  Physical Exam: Vital signs in last 24 hours: @VSRANGES @   General:   Alert,  Well-developed, well-nourished, pleasant and cooperative in NAD Lungs:  Clear throughout to auscultation.   Heart:  Regular rate and rhythm; no murmurs, clicks, rubs,  or gallops. Abdomen:  Soft, nontender and nondistended. Normal bowel sounds.   Neuro/Psych:  Alert and cooperative. Normal mood and affect. A and O x 3    No significant changes were identified.  The patient continues to be an appropriate candidate for the planned procedure and anesthesia.   Edman Circle, MD. [facility] 08/24/2023 10:25 AM@

## 2023-08-24 NOTE — Progress Notes (Signed)
Pt's states no medical or surgical changes since previsit or office visit. 

## 2023-08-24 NOTE — Op Note (Signed)
[facility] Patient Name: Terry Bonilla Procedure Date: 08/24/2023 9:29 AM MRN: [id number] Endoscopist: Lynann Bologna , MD, [id number] Age: 52 Referring MD:  Date of Birth: 04-Sep-1971 Gender: Male Account #: [id number] Procedure:                Colonoscopy Indications:              High risk colon cancer surveillance: Personal                            history of advanced colonic polyp 2019 Medicines:                Monitored Anesthesia Care Procedure:                Pre-Anesthesia Assessment:                           - Prior to the procedure, a History and Physical                            was performed, and patient medications and                            allergies were reviewed. The patient's tolerance of                            previous anesthesia was also reviewed. The risks                            and benefits of the procedure and the sedation                            options and risks were discussed with the patient.                            All questions were answered, and informed consent                            was obtained. Prior Anticoagulants: The patient has                            taken no anticoagulant or antiplatelet agents. ASA                            Grade Assessment: II - A patient with mild systemic                            disease. After reviewing the risks and benefits,                            the patient was deemed in satisfactory condition to                            undergo the procedure.  After obtaining informed consent, the colonoscope                            was passed under direct vision. Throughout the                            procedure, the patient's blood pressure, pulse, and                            oxygen saturations were monitored continuously. The                            CF [id number] #[id number] was introduced through the anus                            and advanced to the the  cecum, identified by                            appendiceal orifice and ileocecal valve. The                            colonoscopy was performed without difficulty. The                            patient tolerated the procedure well. The quality                            of the bowel preparation was good. The ileocecal                            valve, appendiceal orifice, and rectum were                            photographed. Scope In: 10:07:41 AM Scope Out: 10:23:29 AM Scope Withdrawal Time: 0 hours 12 minutes 10 seconds  Total Procedure Duration: 0 hours 15 minutes 48 seconds  Findings:                 Four sessile polyps were found in the proximal                            descending colon, mid transverse colon and distal                            transverse colon(2). The polyps were 2 to 4 mm in                            size. These polyps were removed with a cold snare.                            Resection and retrieval were complete.                           Non-bleeding internal hemorrhoids were found during  retroflexion. The hemorrhoids were moderate and                            Grade I (internal hemorrhoids that do not prolapse).                           The exam was otherwise without abnormality on                            direct and retroflexion views. Complications:            No immediate complications. Estimated Blood Loss:     Estimated blood loss: none. Impression:               - Four 2 to 4 mm polyps in the proximal descending                            colon, in the mid transverse colon and in the                            distal transverse colon, removed with a cold snare.                            Resected and retrieved.                           - Non-bleeding internal hemorrhoids.                           - The examination was otherwise normal on direct                            and retroflexion views. Recommendation:            - Patient has a contact number available for                            emergencies. The signs and symptoms of potential                            delayed complications were discussed with the                            patient. Return to normal activities tomorrow.                            Written discharge instructions were provided to the                            patient.                           - Resume previous diet.                           - Continue present medications.                           -  Await pathology results.                           - Repeat colonoscopy for surveillance based on                            pathology results.                           - The findings and recommendations were discussed                            with the patient. Lynann Bologna, MD 08/24/2023 10:29:06 AM This report has been signed electronically.

## 2023-08-25 ENCOUNTER — Telehealth: Payer: Self-pay

## 2023-08-25 NOTE — Telephone Encounter (Signed)
Left message on answering machine. 

## 2023-08-26 LAB — SURGICAL PATHOLOGY

## 2023-08-29 ENCOUNTER — Encounter: Payer: Self-pay | Admitting: Gastroenterology

## 2023-09-02 DIAGNOSIS — G4733 Obstructive sleep apnea (adult) (pediatric): Secondary | ICD-10-CM | POA: Diagnosis not present

## 2023-09-08 DIAGNOSIS — M2242 Chondromalacia patellae, left knee: Secondary | ICD-10-CM | POA: Diagnosis not present

## 2023-09-08 DIAGNOSIS — M2241 Chondromalacia patellae, right knee: Secondary | ICD-10-CM | POA: Diagnosis not present

## 2023-09-10 ENCOUNTER — Encounter: Payer: Self-pay | Admitting: Internal Medicine

## 2023-09-10 ENCOUNTER — Ambulatory Visit: Payer: BC Managed Care – PPO | Admitting: Internal Medicine

## 2023-09-10 VITALS — BP 120/70 | HR 84 | Ht 75.0 in | Wt 267.6 lb

## 2023-09-10 DIAGNOSIS — Z7984 Long term (current) use of oral hypoglycemic drugs: Secondary | ICD-10-CM

## 2023-09-10 DIAGNOSIS — E538 Deficiency of other specified B group vitamins: Secondary | ICD-10-CM

## 2023-09-10 DIAGNOSIS — E785 Hyperlipidemia, unspecified: Secondary | ICD-10-CM

## 2023-09-10 DIAGNOSIS — E1140 Type 2 diabetes mellitus with diabetic neuropathy, unspecified: Secondary | ICD-10-CM | POA: Diagnosis not present

## 2023-09-10 LAB — VITAMIN B12: Vitamin B-12: 783 pg/mL (ref 211–911)

## 2023-09-10 MED ORDER — METFORMIN HCL ER 750 MG PO TB24: ORAL_TABLET | ORAL | 3 refills | Status: DC

## 2023-09-10 NOTE — Patient Instructions (Addendum)
Please continue: - Metformin ER 1500 mg with dinner  Also, continue: - Vitamin B12 1000 mcg daily  Please stop at the lab.  Please return in 6 months with your sugar log.

## 2023-09-10 NOTE — Progress Notes (Addendum)
Patient ID: Terry Bonilla, male   DOB: 30-Apr-1971, 52 y.o.   MRN: [id number]   HPI: Terry Bonilla is a 52 y.o.-year-old male, referred by his PCP, Dr. Doreene Burke, for management of DM2, dx on 10/15/2019, non-insulin-dependent, uncontrolled, with long-term complications (peripheral neuropathy).  Last visit 6 months ago.  Interim history: No increased urination, blurry vision, nausea, chest pain.   He continues to notice darker urine in the morning at the first void, but no burning with urination or urgency.    DM2: Reviewed history: He was diagnosed with diabetes in 09/2019 after he presented to the ED with a syncopal episode.  In ED, glucose was 372 and he had glycosuria on urinalysis.  HbA1c was high.  He was started on Metformin.  Reviewed HbA1c levels: Lab Results  Component Value Date   HGBA1C 6.2 (A) 03/05/2023   HGBA1C 6.2 (A) 09/03/2022   HGBA1C 5.6 09/01/2021   HGBA1C 5.7 (A) 04/11/2021   HGBA1C 6.7 (A) 12/13/2020   HGBA1C 6.8 (A) 08/05/2020   HGBA1C 6.6 (A) 04/01/2020   HGBA1C 6.4 (A) 01/02/2020   HGBA1C 10.0 (H) 10/23/2019   Pt is on a regimen of:  - Metformin ER 750 mg 1x a day >> 1500 mg with dinner >> lightheadedness >> 750 mg 2x a day >> 1500 mg at dinnertime We discussed about Rybelsus 3 mg before breakfast at last OV - 07/2020, but not started yet.  He had palpitations with an instant release Metformin.  Pt checks his sugars twice a day: - am: 108-144 >> 112-139, 144 >> 118, 126-138, 145 >> n/c - 2h after b'fast: n/c - before lunch: n/c - 2h after lunch: n/c - before dinner: 80, 91, 101-147, 156 >> 87-140, 143 (sick) >> 88-144, 154 - 2h after dinner: n/c - bedtime: 130, 141-169, 209 >> n/c - nighttime: n/c Lowest sugar was 130 >> ... 91 >> 80 >> 87 >> 80 (after walking); it is unclear at which level he has hypoglycemia awareness. Highest sugar was 209 >> .Marland KitchenMarland Kitchen  180 - Prednisone, OTW 156 >> 145 >> 154.  Glucometer: [employer] brand  Feels improved after his diagnosis of  diabetes: - Breakfast: [place] links >> protein bar - snack: bar - Lunch: chicken and broccoli >> lean green lunch: greens + white meat - pm: nuts >> brownie - Dinner: chicken >> lean and green dinner - Snack after dinner:bar He stopped sweet drinks.  He drinks 1-2 alcoholic drinks at night.  -No CKD, last BUN/creatinine:  Lab Results  Component Value Date   BUN 17 06/08/2023   BUN 15 11/26/2022   CREATININE 0.84 06/08/2023   CREATININE 0.81 11/26/2022   Lab Results  Component Value Date   MICRALBCREAT 0.5 06/08/2023   MICRALBCREAT 0.7 02/12/2022   MICRALBCREAT 1.0 01/31/2021   MICRALBCREAT 1.2 10/23/2019  We started lisinopril 5 mg daily in 08/2021. He increased to 10 mg daily 08/2022.  -+ HL; last set of lipids: Lab Results  Component Value Date   CHOL 119 06/08/2023   HDL 28.20 (L) 06/08/2023   LDLCALC 59 06/08/2023   LDLDIRECT 86.0 08/19/2021   TRIG 162.0 (H) 06/08/2023   CHOLHDL 4 06/08/2023  On Lipitor 10.  - last eye exam was in 02/2023: No DR reportedly (Dr. Fredrich Birks). He is on Travaprost for increased pressure.  -+ Numbness and tingling in his feet. He saw by podiatry for an ingrown toenail.  He had onychodystrophy, ingrown toenails.  He takes alpha lipoid acid.  He  had surgery x 2.  Last foot exam (here in clinic) was in 02/2023.    Pt has FH of DM in elder brother.  Vitamin B12 deficiency:  We started B12 injections.  His shooting pain in his legs improved after starting this.  We were able to transition him to 1000 mcg p.o. B12.  Reviewed his B12 levels: Lab Results  Component Value Date   VITAMINB12 888 02/12/2022   VITAMINB12 741 08/19/2021   VITAMINB12 790 11/19/2020   VITAMINB12 473 08/02/2020   VITAMINB12 389 05/14/2020   VITAMINB12 196 (L) 01/30/2020   Allergic to sulfa.  She was on alfuzosin and meloxicam for prostatitis in the past.  He feels that these medications improved his dizziness.  He is now off these medications and dizziness has  not recurred. He also has a history of alcohol use and transaminitis. LFTs were normal in 07/2021.  ROS: + See HPI  I reviewed pt's medications, allergies, PMH, social hx, family hx, and changes were documented in the history of present illness. Otherwise, unchanged from my initial visit note.  Past Medical History:  Diagnosis Date   Diabetes mellitus without complication (HCC) 09/2019   pre-diabetes A1C 6.4   Hypertension    Sleep apnea 2009   cpap   Substance abuse (HCC)    ETOH   Past Surgical History:  Procedure Laterality Date   KIDNEY SURGERY Left 08/09/2020   benign cyst laser removal    TONSILLECTOMY     age 3   Social History   Socioeconomic History   Marital status: Single    Spouse name: Not on file   Number of children: Not on file   Years of education: Not on file   Highest education level: Not on file  Occupational History   Not on file  Tobacco Use   Smoking status: Never   Smokeless tobacco: Never  Vaping Use   Vaping status: Never Used  Substance and Sexual Activity   Alcohol use: Yes    Alcohol/week: 4.0 - 6.0 standard drinks of alcohol    Types: 4 - 6 Standard drinks or equivalent per week    Comment: two drinks nightly occasionally    Drug use: Never   Sexual activity: Not on file  Other Topics Concern   Not on file  Social History Narrative   Lives alone   Right handed   Caffeine: 1 cup/day   Social Determinants of Health   Financial Resource Strain: Not on file  Food Insecurity: Not on file  Transportation Needs: Not on file  Physical Activity: Not on file  Stress: Not on file  Social Connections: Not on file  Intimate Partner Violence: Not on file   Current Outpatient Medications on File Prior to Visit  Medication Sig Dispense Refill   albuterol (VENTOLIN HFA) 108 (90 Base) MCG/ACT inhaler Inhale 1-2 puffs into the lungs every 6 (six) hours as needed.     alfuzosin (UROXATRAL) 10 MG 24 hr tablet Take 10 mg by mouth daily.      Alpha Lipoic Acid 200 MG CAPS Take 1,200 mg by mouth daily.     ammonium lactate (AMLACTIN) 12 % cream Apply topically as needed for dry skin. 385 g 0   atorvastatin (LIPITOR) 10 MG tablet TAKE 1 TABLET BY MOUTH EVERY DAY IN THE EVENING 90 tablet 3   betamethasone dipropionate 0.05 % cream Apply 1 application topically as needed.     Blood Glucose Calibration (ASCENSIA CONTOUR VI) by In  Vitro route.     Blood Glucose Monitoring Suppl (FREESTYLE FREEDOM LITE) w/Device KIT 1 kit by Does not apply route as needed. 1 kit 0   doxycycline (MONODOX) 100 MG capsule Take 100 mg by mouth daily.     finasteride (PROPECIA) 1 MG tablet Take 1 tablet by mouth daily.     fluticasone (FLONASE) 50 MCG/ACT nasal spray Place 2 sprays into both nostrils daily. 16 g 6   Glucose Blood (ASCENSIA CONTOUR TEST VI) 100 each by In Vitro route 1 day or 1 dose.     Lancets (FREESTYLE) lancets USE AS DIRECTED AS NEEDED 100 each 0   latanoprost (XALATAN) 0.005 % ophthalmic solution 1 drop at bedtime.     lisinopril (ZESTRIL) 10 MG tablet TAKE 1 TABLET BY MOUTH EVERY DAY 90 tablet 3   metFORMIN (GLUCOPHAGE-XR) 750 MG 24 hr tablet Take 1500 mg by mouth with dinner 180 tablet 3   triamcinolone (KENALOG) 0.025 % ointment Apply 1 application topically 2 (two) times daily. Not for face or private areas. 80 g 0   vitamin B-12 (CYANOCOBALAMIN) 1000 MCG tablet Take 1,000 mcg by mouth daily.     Current Facility-Administered Medications on File Prior to Visit  Medication Dose Route Frequency Provider Last Rate Last Admin   0.9 %  sodium chloride infusion  500 mL Intravenous Once Lynann Bologna, MD       Allergies  Allergen Reactions   Celecoxib Hives   Crab [Shellfish Allergy] Hives   Elemental Sulfur Rash   Sulfa Antibiotics Hives   PMH: -Pertinent PMH reviewed in HPI  PE: BP 120/70   Pulse 84   Ht 6\' 3"  (1.905 m)   Wt 267 lb 9.6 oz (121.4 kg)   SpO2 98%   BMI 33.45 kg/m  Wt Readings from Last 10 Encounters:   09/10/23 267 lb 9.6 oz (121.4 kg)  08/24/23 265 lb (120.2 kg)  08/10/23 265 lb (120.2 kg)  06/08/23 267 lb (121.1 kg)  03/05/23 265 lb (120.2 kg)  11/30/22 271 lb (122.9 kg)  11/26/22 269 lb 9.6 oz (122.3 kg)  09/03/22 263 lb 12.8 oz (119.7 kg)  08/17/22 261 lb 12.8 oz (118.8 kg)  07/20/22 266 lb 11.2 oz (121 kg)   Constitutional: overweight, in NAD Eyes:  EOMI, no exophthalmos ENT: no neck masses, no cervical lymphadenopathy Cardiovascular: RRR, No MRG Respiratory: CTA B Musculoskeletal: no deformities Skin:no rashes Neurological: no tremor with outstretched hands  ASSESSMENT: 1. DM2, non-insulin-dependent, uncontrolled, with complications - PN  He does not have a family history of medullary thyroid cancer or personal history of pancreatitis.  2. HL  3.  Vitamin B12 deficiency  PLAN:  1. Patient with history of type 2 diabetes, controlled, diagnosed after a syncopal episode in 09/2019.  He was initially on metformin IR, which he could not tolerate due to palpitations.  However, he now tolerates well the ER formulation.  He saw nutrition and started to change his diet and stop drinking sweet drinks after his diagnosis, with subsequent improvement in blood sugars.  Nadir HbA1c was 5.6%, but at last visit, HbA1c was stable, at 6.2%.  Sugars were almost all at goal at last visit but he was only checking before dinner.  I did advise him to try to rotate his blood sugar checks but we did not change his regimen. -At today's visit, he is not checking blood sugars in the morning, mostly before dinner.  These are mostly at goal, but he occasionally has a  slightly higher value after a larger lunch.  No need to change his regimen for now. - I suggested to:  Patient Instructions  Please continue: - Metformin ER 1500 mg with dinner  Also, continue: - Vitamin B12 1000 mcg daily  Please return in 6 months with your sugar log.   - we checked his HbA1c: 6.2%) - advised to check sugars at  different times of the day - 1x a day, rotating check times - advised for yearly eye exams >> he is UTD - return to clinic in 6 months  2. HL -Reviewed latest lipid panel from 05/2023: Fractions at goal with the exception of a low HDL Lab Results  Component Value Date   CHOL 119 06/08/2023   HDL 28.20 (L) 06/08/2023   LDLCALC 59 06/08/2023   LDLDIRECT 86.0 08/19/2021   TRIG 162.0 (H) 06/08/2023   CHOLHDL 4 06/08/2023  -He continues on Lipitor 10 mg daily without side effects  3.  Vitamin B12 deficiency -He was initially on B12 injections but then switched to B12 1000 mcg daily -he tells me that he is now taking this in a methylated form -Tingling improved after starting B12 but he still has some tingling in the tips of his fingers >> I suggested alpha-lipoic acid 600 mg twice a day, which he continues. -B12 level have been normal: 741 (07/2021) and 888 (01/2022) -We tried Benfotiamine 300 mg twice a day but it did not help. -He has mild numbness in his toes.  Foot exam was normal in 02/2023. -Will check a B12 level now.  Orders Placed This Encounter  Procedures   Vitamin B12    Component     Latest Ref Rng 09/10/2023  Vitamin B12     211 - 911 pg/mL 783   Normal B12.  Carlus Pavlov, MD PhD [facility]

## 2023-10-03 DIAGNOSIS — G4733 Obstructive sleep apnea (adult) (pediatric): Secondary | ICD-10-CM | POA: Diagnosis not present

## 2023-11-02 DIAGNOSIS — G4733 Obstructive sleep apnea (adult) (pediatric): Secondary | ICD-10-CM | POA: Diagnosis not present

## 2023-11-02 DIAGNOSIS — H40053 Ocular hypertension, bilateral: Secondary | ICD-10-CM | POA: Diagnosis not present

## 2023-11-26 NOTE — Progress Notes (Signed)
 [facility] [address] [place], [place] [address]  Pulmonary Sleep Medicine   Office Visit Note  Patient Name: Terry Bonilla DOB: July 15, 1971 MRN [id number]    Chief Complaint: Obstructive Sleep Apnea visit  Brief History:  Terry Bonilla is seen today for an annual follow up visit for CPAP@ 10 cmH2O. The patient has a 7.5 year history of sleep apnea. Patient is using PAP nightly.  The patient feels rested after sleeping with PAP.  The patient reports benefiting from PAP use. Reported sleepiness is  improved and the Epworth Sleepiness Score is 1 out of 24. The patient will seldom take naps. The patient complains of the following: none.  The compliance download shows 99% compliance with an average use time of 8 hours 25 minutes. The AHI is 4.9.  The patient does not complain of limb movements disrupting sleep. The patient continues to require PAP therapy in order to eliminate sleep apnea.   ROS  General: (-) fever, (-) chills, (-) night sweat Nose and Sinuses: (-) nasal stuffiness or itchiness, (-) postnasal drip, (-) nosebleeds, (-) sinus trouble. Mouth and Throat: (-) sore throat, (-) hoarseness. Neck: (-) swollen glands, (-) enlarged thyroid , (-) neck pain. Respiratory: + cough, - shortness of breath, + wheezing. Neurologic: - numbness, - tingling. Psychiatric: - anxiety, - depression   Current Medication: Outpatient Encounter Medications as of 11/29/2023  Medication Sig Note   albuterol (VENTOLIN HFA) 108 (90 Base) MCG/ACT inhaler Inhale 1-2 puffs into the lungs every 6 (six) hours as needed.    alfuzosin (UROXATRAL) 10 MG 24 hr tablet Take 10 mg by mouth daily. 08/17/2022: PRN   Alpha Lipoic Acid 200 MG CAPS Take 1,200 mg by mouth daily.    ammonium lactate  (AMLACTIN) 12 % cream Apply topically as needed for dry skin. 08/02/2020: prn   atorvastatin  (LIPITOR) 10 MG tablet TAKE 1 TABLET BY MOUTH EVERY DAY IN THE EVENING    betamethasone dipropionate 0.05 % cream Apply 1  application topically as needed. 08/02/2020: prn   Blood Glucose Calibration (ASCENSIA CONTOUR VI) by In Vitro route.    Blood Glucose Monitoring Suppl (FREESTYLE FREEDOM LITE) w/Device KIT 1 kit by Does not apply route as needed.    finasteride (PROPECIA) 1 MG tablet Take 1 tablet by mouth daily.    fluticasone (FLONASE) 50 MCG/ACT nasal spray Place 2 sprays into both nostrils daily.    Glucose Blood (ASCENSIA CONTOUR TEST VI) 100 each by In Vitro route 1 day or 1 dose.    Lancets (FREESTYLE) lancets USE AS DIRECTED AS NEEDED    latanoprost (XALATAN) 0.005 % ophthalmic solution 1 drop at bedtime.    lisinopril  (ZESTRIL ) 10 MG tablet TAKE 1 TABLET BY MOUTH EVERY DAY    metFORMIN  (GLUCOPHAGE -XR) 750 MG 24 hr tablet Take 1500 mg by mouth with dinner    triamcinolone  (KENALOG ) 0.025 % ointment Apply 1 application topically 2 (two) times daily. Not for face or private areas.    vitamin B-12 (CYANOCOBALAMIN ) 1000 MCG tablet Take 1,000 mcg by mouth daily.    Facility-Administered Encounter Medications as of 11/29/2023  Medication   0.9 %  sodium chloride  infusion    Surgical History: Past Surgical History:  Procedure Laterality Date   KIDNEY SURGERY Left 08/09/2020   benign cyst laser removal    TONSILLECTOMY     age 53    Medical History: Past Medical History:  Diagnosis Date   Diabetes mellitus without complication (HCC) 09/2019   pre-diabetes A1C 6.4  Hypertension    Sleep apnea 2009   cpap   Substance abuse (HCC)    ETOH    Family History: Non contributory to the present illness  Social History: Social History   Socioeconomic History   Marital status: Single    Spouse name: Not on file   Number of children: Not on file   Years of education: Not on file   Highest education level: Not on file  Occupational History   Not on file  Tobacco Use   Smoking status: Never   Smokeless tobacco: Never  Vaping Use   Vaping status: Never Used  Substance and Sexual Activity    Alcohol use: Yes    Alcohol/week: 4.0 - 6.0 standard drinks of alcohol    Types: 4 - 6 Standard drinks or equivalent per week    Comment: two drinks nightly occasionally    Drug use: Never   Sexual activity: Not on file  Other Topics Concern   Not on file  Social History Narrative   Lives alone   Right handed   Caffeine: 1 cup/day   Social Drivers of Corporate Investment Banker Strain: Not on file  Food Insecurity: Not on file  Transportation Needs: Not on file  Physical Activity: Not on file  Stress: Not on file  Social Connections: Not on file  Intimate Partner Violence: Not on file    Vital Signs: Blood pressure (!) 138/97, pulse 77, resp. rate 16, height 6' 3 (1.905 m), weight 273 lb (123.8 kg), SpO2 98%. Body mass index is 34.12 kg/m.    Examination: General Appearance: The patient is well-developed, well-nourished, and in no distress. Neck Circumference: 50 cm Skin: Gross inspection of skin unremarkable. Head: normocephalic, no gross deformities. Eyes: no gross deformities noted. ENT: ears appear grossly normal Neurologic: Alert and oriented. No involuntary movements.  STOP BANG RISK ASSESSMENT S (snore) Have you been told that you snore?     NO   T (tired) Are you often tired, fatigued, or sleepy during the day?   NO  O (obstruction) Do you stop breathing, choke, or gasp during sleep? NO   P (pressure) Do you have or are you being treated for high blood pressure? YES   B (BMI) Is your body index greater than 35 kg/m? NO   A (age) Are you 74 years old or older? YES   N (neck) Do you have a neck circumference greater than 16 inches?   YES   G (gender) Are you a male? YES   TOTAL STOP/BANG "YES" ANSWERS 4       A STOP-Bang score of 2 or less is considered low risk, and a score of 5 or more is high risk for having either moderate or severe OSA. For people who score 3 or 4, doctors may need to perform further assessment to determine how likely they are  to have OSA.         EPWORTH SLEEPINESS SCALE:  Scale:  (0)= no chance of dozing; (1)= slight chance of dozing; (2)= moderate chance of dozing; (3)= high chance of dozing  Chance  Situtation    Sitting and reading: 0    Watching TV: 0    Sitting Inactive in public: 0    As a passenger in car: 0      Lying down to rest: 1    Sitting and talking: 0    Sitting quielty after lunch: 0    In a car, stopped in  traffic: 0   TOTAL SCORE:   1 out of 24    SLEEP STUDIES:  PSG (05/2008) AHI 46/hr, min SpO2 78% Titration (05/2008) CPAP@ 10 cmH2O Titration (03/2016) CPAP@ 10 cmH2O   CPAP COMPLIANCE DATA:  Date Range: 11/26/2022-11/25/2023  Average Daily Use: 8 hours 25 minutes  Median Use: 8 hours 28 minutes  Compliance for > 4 Hours: 99%  AHI: 4.9 respiratory events per hour  Days Used: 365/365 days  Mask Leak: 33.7  95th Percentile Pressure: 10         LABS: Recent Results (from the past 2160 hours)  Vitamin B12     Status: None   Collection Time: 09/10/23  9:25 AM  Result Value Ref Range   Vitamin B-12 783 211 - 911 pg/mL    Radiology: VAS US  CAROTID Result Date: 09/17/2020 Carotid Arterial Duplex Study Indications:       Visual disturbance. Risk Factors:      Diabetes. Comparison Study:  no prior Performing Technologist: Duwaine Needle RVS  Examination Guidelines: A complete evaluation includes B-mode imaging, spectral Doppler, color Doppler, and power Doppler as needed of all accessible portions of each vessel. Bilateral testing is considered an integral part of a complete examination. Limited examinations for reoccurring indications may be performed as noted.  Right Carotid Findings: +----------+--------+--------+--------+------------------+--------+           PSV cm/sEDV cm/sStenosisPlaque DescriptionComments +----------+--------+--------+--------+------------------+--------+ CCA Prox  126     29              heterogenous                +----------+--------+--------+--------+------------------+--------+ CCA Distal76      19              heterogenous               +----------+--------+--------+--------+------------------+--------+ ICA Prox  77      33      1-39%   heterogenous               +----------+--------+--------+--------+------------------+--------+ ICA Distal86      33                                         +----------+--------+--------+--------+------------------+--------+ ECA       96      18                                         +----------+--------+--------+--------+------------------+--------+ +----------+--------+-------+--------+-------------------+           PSV cm/sEDV cmsDescribeArm Pressure (mmHG) +----------+--------+-------+--------+-------------------+ [id number]                                         +----------+--------+-------+--------+-------------------+ +---------+--------+--+--------+--+---------+ VertebralPSV cm/s33EDV cm/s10Antegrade +---------+--------+--+--------+--+---------+  Left Carotid Findings: +----------+--------+--------+--------+------------------+--------+           PSV cm/sEDV cm/sStenosisPlaque DescriptionComments +----------+--------+--------+--------+------------------+--------+ CCA Prox  115     25              heterogenous               +----------+--------+--------+--------+------------------+--------+ CCA Distal72      24              heterogenous               +----------+--------+--------+--------+------------------+--------+  ICA Prox  68      28      1-39%   heterogenous               +----------+--------+--------+--------+------------------+--------+ ICA Distal82      37                                         +----------+--------+--------+--------+------------------+--------+ ECA       81      13                                          +----------+--------+--------+--------+------------------+--------+ +----------+--------+--------+--------+-------------------+           PSV cm/sEDV cm/sDescribeArm Pressure (mmHG) +----------+--------+--------+--------+-------------------+ [id number]                                          +----------+--------+--------+--------+-------------------+ +---------+--------+--+--------+--+---------+ VertebralPSV cm/s39EDV cm/s13Antegrade +---------+--------+--+--------+--+---------+   Summary: Right Carotid: Velocities in the right ICA are consistent with a 1-39% stenosis. Left Carotid: Velocities in the left ICA are consistent with a 1-39% stenosis. Vertebrals: Bilateral vertebral arteries demonstrate antegrade flow. *See table(s) above for measurements and observations.  Electronically signed by Eather Popp MD on 09/17/2020 at 2:08:43 PM.    Final     No results found.  No results found.    Assessment and Plan: Patient Active Problem List   Diagnosis Date Noted   Immunization due 06/08/2023   Bilateral low back pain without sciatica 11/26/2022   Essential hypertension 11/26/2022   Healthcare maintenance 07/20/2022   OSA on CPAP 07/21/2021   Encounter for hepatitis C screening test for low risk patient 07/21/2021   Elevated BP without diagnosis of hypertension 01/31/2021   Dizziness 09/10/2020   Ingrown toenail 03/04/2020   B12 deficiency 01/31/2020   Neuropathy 01/31/2020   Elevated LDL cholesterol level 01/26/2020   Flexural eczema 01/26/2020   Lightheadedness 11/28/2019   Elevated liver enzymes 11/28/2019   Alcohol use 11/28/2019   Palpitations 10/30/2019   Type 2 diabetes mellitus with diabetic neuropathy, without long-term current use of insulin (HCC) 10/23/2019   Screening for blood disease 10/23/2019   Need for influenza vaccination 10/23/2019   Atypical nevus 10/23/2019   Obesity (BMI 30.0-34.9) 03/20/2016    1. OSA on CPAP (Primary) The patient does  tolerate PAP and reports  benefit from PAP use. He has a mask leak and a mask fitting was recommended. The patient was reminded how to clean equipment and advised to replace supplies routinely. The patient was also counselled on weight loss. The compliance is excellent. The AHI is 4.9, likely due to mask leak.   OSA on cpap- controlled. Continue with excellent compliance with pap. CPAP continues to be medically necessary to treat this patient's OSA. F/u one year.     2. CPAP use counseling CPAP Counseling: had a lengthy discussion with the patient regarding the importance of PAP therapy in management of the sleep apnea. Patient appears to understand the risk factor reduction and also understands the risks associated with untreated sleep apnea. Patient will try to make a good faith effort to remain compliant with therapy. Also instructed the patient on proper cleaning of the device including the water must  be changed daily if possible and use of distilled water is preferred. Patient understands that the machine should be regularly cleaned with appropriate recommended cleaning solutions that do not damage the PAP machine for example given white vinegar and water rinses. Other methods such as ozone treatment may not be as good as these simple methods to achieve cleaning.   3. Obesity (BMI 30.0-34.9) Obesity Counseling: Had a lengthy discussion regarding patients BMI and weight issues. Patient was instructed on portion control as well as increased activity. Also discussed caloric restrictions with trying to maintain intake less than 2000 Kcal. Discussions were made in accordance with the 5As of weight management. Simple actions such as not eating late and if able to, taking a walk is suggested.       General Counseling: I have discussed the findings of the evaluation and examination with Terry Bonilla.  I have also discussed any further diagnostic evaluation thatmay be needed or ordered today. Terry Bonilla verbalizes  understanding of the findings of todays visit. We also reviewed his medications today and discussed drug interactions and side effects including but not limited excessive drowsiness and altered mental states. We also discussed that there is always a risk not just to him but also people around him. he has been encouraged to call the office with any questions or concerns that should arise related to todays visit.  No orders of the defined types were placed in this encounter.       I have personally obtained a history, examined the patient, evaluated laboratory and imaging results, formulated the assessment and plan and placed orders. This patient was seen today by Lauraine Lay, PA-C in collaboration with Dr. Elfreda Bathe.   Elfreda DELENA Bathe, MD [facility] Diplomate ABMS Pulmonary Critical Care Medicine and Sleep Medicine

## 2023-11-29 ENCOUNTER — Ambulatory Visit (INDEPENDENT_AMBULATORY_CARE_PROVIDER_SITE_OTHER): Payer: BC Managed Care – PPO | Admitting: Internal Medicine

## 2023-11-29 VITALS — BP 138/97 | HR 77 | Resp 16 | Ht 75.0 in | Wt 273.0 lb

## 2023-11-29 DIAGNOSIS — Z7189 Other specified counseling: Secondary | ICD-10-CM | POA: Diagnosis not present

## 2023-11-29 DIAGNOSIS — G4733 Obstructive sleep apnea (adult) (pediatric): Secondary | ICD-10-CM

## 2023-11-29 DIAGNOSIS — E66811 Obesity, class 1: Secondary | ICD-10-CM | POA: Diagnosis not present

## 2023-11-29 NOTE — Patient Instructions (Signed)

## 2023-12-01 DIAGNOSIS — G4733 Obstructive sleep apnea (adult) (pediatric): Secondary | ICD-10-CM | POA: Diagnosis not present

## 2023-12-12 ENCOUNTER — Other Ambulatory Visit: Payer: Self-pay | Admitting: Family Medicine

## 2023-12-12 DIAGNOSIS — E7800 Pure hypercholesterolemia, unspecified: Secondary | ICD-10-CM

## 2023-12-16 DIAGNOSIS — Z6833 Body mass index (BMI) 33.0-33.9, adult: Secondary | ICD-10-CM | POA: Diagnosis not present

## 2023-12-16 DIAGNOSIS — J209 Acute bronchitis, unspecified: Secondary | ICD-10-CM | POA: Diagnosis not present

## 2023-12-16 DIAGNOSIS — J0140 Acute pansinusitis, unspecified: Secondary | ICD-10-CM | POA: Diagnosis not present

## 2024-01-18 ENCOUNTER — Emergency Department (HOSPITAL_COMMUNITY)
Admission: EM | Admit: 2024-01-18 | Discharge: 2024-01-19 | Discharge status: Against Medical Advice | Payer: BC Managed Care – PPO | Attending: Emergency Medicine | Admitting: Emergency Medicine

## 2024-01-18 DIAGNOSIS — Z5321 Procedure and treatment not carried out due to patient leaving prior to being seen by health care provider: Secondary | ICD-10-CM | POA: Diagnosis not present

## 2024-01-18 DIAGNOSIS — R000 Tachycardia, unspecified: Secondary | ICD-10-CM | POA: Diagnosis not present

## 2024-01-18 DIAGNOSIS — R112 Nausea with vomiting, unspecified: Secondary | ICD-10-CM | POA: Diagnosis not present

## 2024-01-18 DIAGNOSIS — R109 Unspecified abdominal pain: Secondary | ICD-10-CM | POA: Diagnosis not present

## 2024-01-18 DIAGNOSIS — I10 Essential (primary) hypertension: Secondary | ICD-10-CM | POA: Diagnosis not present

## 2024-01-18 DIAGNOSIS — R1111 Vomiting without nausea: Secondary | ICD-10-CM | POA: Diagnosis not present

## 2024-01-18 LAB — CBC WITH DIFFERENTIAL/PLATELET
Abs Immature Granulocytes: 0.03 10*3/uL (ref 0.00–0.07)
Basophils Absolute: 0 10*3/uL (ref 0.0–0.1)
Basophils Relative: 1 %
Eosinophils Absolute: 0.1 10*3/uL (ref 0.0–0.5)
Eosinophils Relative: 1 %
HCT: 43.4 % (ref 39.0–52.0)
Hemoglobin: 14.8 g/dL (ref 13.0–17.0)
Immature Granulocytes: 1 %
Lymphocytes Relative: 6 %
Lymphs Abs: 0.3 10*3/uL — ABNORMAL LOW (ref 0.7–4.0)
MCH: 31.6 pg (ref 26.0–34.0)
MCHC: 34.1 g/dL (ref 30.0–36.0)
MCV: 92.7 fL (ref 80.0–100.0)
Monocytes Absolute: 0.6 10*3/uL (ref 0.1–1.0)
Monocytes Relative: 12 %
Neutro Abs: 4.1 10*3/uL (ref 1.7–7.7)
Neutrophils Relative %: 79 %
Platelets: 150 10*3/uL (ref 150–400)
RBC: 4.68 MIL/uL (ref 4.22–5.81)
RDW: 12 % (ref 11.5–15.5)
WBC: 5.2 10*3/uL (ref 4.0–10.5)
nRBC: 0 % (ref 0.0–0.2)

## 2024-01-18 LAB — COMPREHENSIVE METABOLIC PANEL
ALT: 51 U/L — ABNORMAL HIGH (ref 0–44)
AST: 37 U/L (ref 15–41)
Albumin: 4.5 g/dL (ref 3.5–5.0)
Alkaline Phosphatase: 54 U/L (ref 38–126)
Anion gap: 11 (ref 5–15)
BUN: 16 mg/dL (ref 6–20)
CO2: 22 mmol/L (ref 22–32)
Calcium: 9.6 mg/dL (ref 8.9–10.3)
Chloride: 106 mmol/L (ref 98–111)
Creatinine, Ser: 0.75 mg/dL (ref 0.61–1.24)
GFR, Estimated: >60 mL/min (ref >60)
Glucose, Bld: 168 mg/dL — ABNORMAL HIGH (ref 70–99)
Potassium: 4 mmol/L (ref 3.5–5.1)
Sodium: 139 mmol/L (ref 135–145)
Total Bilirubin: 1 mg/dL (ref 0.0–1.2)
Total Protein: 7.3 g/dL (ref 6.5–8.1)

## 2024-01-18 NOTE — ED Provider Triage Note (Signed)
 Emergency Medicine Provider Triage Evaluation Note  Terry Bonilla , a 53 y.o. male  was evaluated in triage.  Pt complains of abdominal pain, vomiting.  Patient reports that he woke up this morning around 3 AM with nausea and vomiting that is not improved.  Patient received Zofran earlier by EMS without improvement in symptoms.  Denies any significant focal abdominal pain but states that he does feel some cramping when he feels the need to dry heave.  Denies any hematemesis, medic easy, or melanotic stools.  No recent sick contacts as far as he is aware.  Review of Systems  Positive: As above Negative: As above  Physical Exam  BP (!) 140/79 (BP Location: Right Arm)   Pulse (!) 106   Temp 99.3 F (37.4 C) (Oral)   Resp 18   SpO2 99%  Gen:   Awake, no distress   Resp:  Normal effort  MSK:   Moves extremities without difficulty  Other:    Medical Decision Making  Medically screening exam initiated at 6:26 PM.  Appropriate orders placed.  Terry Bonilla was informed that the remainder of the evaluation will be completed by another provider, this initial triage assessment does not replace that evaluation, and the importance of remaining in the ED until their evaluation is complete.     Smitty Knudsen, PA-C 01/18/24 [phone]

## 2024-01-18 NOTE — ED Triage Notes (Addendum)
 Pt arrives via [facility] for abd pain and N/V. Pt states that he was woken up at 0330 this morning with frequent N/V. Denies bloody or coffee ground emesis. No recent suspicious food intake. Pt given 4 mg IV zofran and 400 mL LR bolus enroute

## 2024-01-31 DIAGNOSIS — G4733 Obstructive sleep apnea (adult) (pediatric): Secondary | ICD-10-CM | POA: Diagnosis not present

## 2024-03-10 DIAGNOSIS — H40053 Ocular hypertension, bilateral: Secondary | ICD-10-CM | POA: Diagnosis not present

## 2024-03-10 LAB — HM DIABETES EYE EXAM

## 2024-03-16 ENCOUNTER — Encounter: Payer: Self-pay | Admitting: Internal Medicine

## 2024-03-16 ENCOUNTER — Ambulatory Visit: Payer: BC Managed Care – PPO | Admitting: Internal Medicine

## 2024-03-16 VITALS — BP 120/80 | HR 83 | Ht 75.0 in | Wt 265.8 lb

## 2024-03-16 DIAGNOSIS — E785 Hyperlipidemia, unspecified: Secondary | ICD-10-CM

## 2024-03-16 DIAGNOSIS — E538 Deficiency of other specified B group vitamins: Secondary | ICD-10-CM

## 2024-03-16 DIAGNOSIS — Z7984 Long term (current) use of oral hypoglycemic drugs: Secondary | ICD-10-CM | POA: Diagnosis not present

## 2024-03-16 DIAGNOSIS — E1140 Type 2 diabetes mellitus with diabetic neuropathy, unspecified: Secondary | ICD-10-CM

## 2024-03-16 LAB — POCT GLYCOSYLATED HEMOGLOBIN (HGB A1C): Hemoglobin A1C: 6.7 % — AB (ref 4.0–5.6)

## 2024-03-16 NOTE — Progress Notes (Signed)
 Patient ID: [name], male   DOB: 1971/06/08, 53 y.o.   MRN: [id number]   HPI: Terry Bonilla is a 53 y.o.-year-old male, referred by his PCP, Dr. Tilmon Bonilla, for management of DM2, dx on 10/15/2019, non-insulin-dependent, uncontrolled, with long-term complications (peripheral neuropathy).  Last visit 6 months ago.  Interim history: No increased urination, blurry vision, nausea, chest pain.   He had sinusitis and bronchitis 11/2023. He had cough for 3 months. He was on steroids, cough syrup.  He felt that sugars were higher.  DM2: Reviewed history: He was diagnosed with diabetes in 09/2019 after he presented to the ED with a syncopal episode.  In ED, glucose was 372 and he had glycosuria on urinalysis.  HbA1c was high.  He was started on Metformin .  Reviewed HbA1c levels: 09/10/2023: HbA1c 6.2% Lab Results  Component Value Date   HGBA1C 6.2 (A) 03/05/2023   HGBA1C 6.2 (A) 09/03/2022   HGBA1C 5.6 09/01/2021   HGBA1C 5.7 (A) 04/11/2021   HGBA1C 6.7 (A) 12/13/2020   HGBA1C 6.8 (A) 08/05/2020   HGBA1C 6.6 (A) 04/01/2020   HGBA1C 6.4 (A) 01/02/2020   HGBA1C 10.0 (H) 10/23/2019   Pt is on a regimen of:  - Metformin  ER 750 mg 1x a day >> 1500 mg with dinner >> lightheadedness >> 750 mg 2x a day >> 1500 mg at dinnertime We discussed about Rybelsus  3 mg before breakfast at last OV - 07/2020, but not started yet.  He had palpitations with an instant release Metformin .  Pt checks his sugars twice a day: - am: 108-144 >> 112-139, 144 >> 118, 126-138, 145 >> n/c - 2h after b'fast: n/c - before lunch: n/c - 2h after lunch: n/c - before dinner: 87-140, 143 >> 88-144, 154 >> 104-144 - 2h after dinner: n/c - bedtime: 130, 141-169, 209 >> n/c - nighttime: n/c Lowest sugar was 130 >> ...  87 >> 80 (after walking) >> 88; it is unclear at which level he has hypoglycemia awareness. Highest sugar was 209 >> .Terry Bonilla.  145 >> 154 >> 150.  Glucometer: [employer]  Feels improved after his diagnosis of  diabetes: - Breakfast: [place] links >> protein bar - snack: bar - Lunch: chicken and broccoli >> lean green lunch: greens + white meat - pm: nuts >> brownie - Dinner: chicken >> lean and green dinner - Snack after dinner:bar He stopped sweet drinks.  He drinks 1-2 alcoholic drinks at night.  -No CKD, last BUN/creatinine:  Lab Results  Component Value Date   BUN 16 01/18/2024   BUN 17 06/08/2023   CREATININE 0.75 01/18/2024   CREATININE 0.84 06/08/2023   Lab Results  Component Value Date   MICRALBCREAT 0.5 06/08/2023   MICRALBCREAT 0.7 02/12/2022   MICRALBCREAT 1.0 01/31/2021   MICRALBCREAT 1.2 10/23/2019  We started lisinopril  5 mg daily in 08/2021. He increased to 10 mg daily 08/2022.  -+ HL; last set of lipids: Lab Results  Component Value Date   CHOL 119 06/08/2023   HDL 28.20 (L) 06/08/2023   LDLCALC 59 06/08/2023   LDLDIRECT 86.0 08/19/2021   TRIG 162.0 (H) 06/08/2023   CHOLHDL 4 06/08/2023  On Lipitor 10.  - last eye exam was in 02/2024: No DR reportedly (Dr. Nelva Bonilla). He is on Travaprost for increased pressure.  -+ Numbness and tingling in his feet. He saw by podiatry for an ingrown toenail.  He had onychodystrophy, ingrown toenails.  He takes alpha lipoid acid.  He had surgery x  2.  Last foot exam (here in clinic) was in 02/2023.    Pt has FH of DM in elder brother.  Vitamin B12 deficiency:  We started B12 injections.  His shooting pain in his legs improved after starting this.  We were able to transition him to 1000 mcg p.o. B12.  Reviewed his B12 levels: Lab Results  Component Value Date   VITAMINB12 783 09/10/2023   VITAMINB12 888 02/12/2022   VITAMINB12 741 08/19/2021   VITAMINB12 790 11/19/2020   VITAMINB12 473 08/02/2020   VITAMINB12 389 05/14/2020   VITAMINB12 196 (L) 01/30/2020   Allergic to sulfa.  She was on alfuzosin and meloxicam for prostatitis in the past.  He feels that these medications improved his dizziness.  He is now off these  medications and dizziness has not recurred. He also has a history of alcohol use and transaminitis. LFTs were normal in 07/2021.  ROS: + See HPI  I reviewed pt's medications, allergies, PMH, social hx, family hx, and changes were documented in the history of present illness. Otherwise, unchanged from my initial visit note.  Past Medical History:  Diagnosis Date   Diabetes mellitus without complication (HCC) 09/2019   pre-diabetes A1C 6.4   Hypertension    Sleep apnea 2009   cpap   Substance abuse (HCC)    ETOH   Past Surgical History:  Procedure Laterality Date   KIDNEY SURGERY Left 08/09/2020   benign cyst laser removal    TONSILLECTOMY     age 53   Social History   Socioeconomic History   Marital status: Single    Spouse name: Not on file   Number of children: Not on file   Years of education: Not on file   Highest education level: Not on file  Occupational History   Not on file  Tobacco Use   Smoking status: Never   Smokeless tobacco: Never  Vaping Use   Vaping status: Never Used  Substance and Sexual Activity   Alcohol use: Yes    Alcohol/week: 4.0 - 6.0 standard drinks of alcohol    Types: 4 - 6 Standard drinks or equivalent per week    Comment: two drinks nightly occasionally    Drug use: Never   Sexual activity: Not on file  Other Topics Concern   Not on file  Social History Narrative   Lives alone   Right handed   Caffeine: 1 cup/day   Social Drivers of Corporate investment banker Strain: Not on file  Food Insecurity: Not on file  Transportation Needs: Not on file  Physical Activity: Not on file  Stress: Not on file  Social Connections: Not on file  Intimate Partner Violence: Not on file   Current Outpatient Medications on File Prior to Visit  Medication Sig Dispense Refill   albuterol (VENTOLIN HFA) 108 (90 Base) MCG/ACT inhaler Inhale 1-2 puffs into the lungs every 6 (six) hours as needed.     alfuzosin (UROXATRAL) 10 MG 24 hr tablet Take 10  mg by mouth daily.     Alpha Lipoic Acid 200 MG CAPS Take 1,200 mg by mouth daily.     ammonium lactate  (AMLACTIN) 12 % cream Apply topically as needed for dry skin. 385 g 0   atorvastatin  (LIPITOR) 10 MG tablet TAKE 1 TABLET BY MOUTH EVERY DAY IN THE EVENING 90 tablet 3   betamethasone dipropionate 0.05 % cream Apply 1 application topically as needed.     Blood Glucose Calibration (ASCENSIA CONTOUR VI)  by In Vitro route.     Blood Glucose Monitoring Suppl (FREESTYLE FREEDOM LITE) w/Device KIT 1 kit by Does not apply route as needed. 1 kit 0   finasteride (PROPECIA) 1 MG tablet Take 1 tablet by mouth daily.     fluticasone (FLONASE) 50 MCG/ACT nasal spray Place 2 sprays into both nostrils daily. 16 g 6   Glucose Blood (ASCENSIA CONTOUR TEST VI) 100 each by In Vitro route 1 day or 1 dose.     Lancets (FREESTYLE) lancets USE AS DIRECTED AS NEEDED 100 each 0   latanoprost (XALATAN) 0.005 % ophthalmic solution 1 drop at bedtime.     lisinopril  (ZESTRIL ) 10 MG tablet TAKE 1 TABLET BY MOUTH EVERY DAY 90 tablet 3   metFORMIN  (GLUCOPHAGE -XR) 750 MG 24 hr tablet Take 1500 mg by mouth with dinner 180 tablet 3   triamcinolone  (KENALOG ) 0.025 % ointment Apply 1 application topically 2 (two) times daily. Not for face or private areas. 80 g 0   vitamin B-12 (CYANOCOBALAMIN ) 1000 MCG tablet Take 1,000 mcg by mouth daily.     Current Facility-Administered Medications on File Prior to Visit  Medication Dose Route Frequency Provider Last Rate Last Admin   0.9 %  sodium chloride  infusion  500 mL Intravenous Once Lajuan Pila, MD       Allergies  Allergen Reactions   Celecoxib Hives   Crab [Shellfish Allergy] Hives   Elemental Sulfur Rash   Sulfa Antibiotics Hives   PMH: -Pertinent PMH reviewed in HPI  PE: There were no vitals taken for this visit. Wt Readings from Last 10 Encounters:  11/29/23 273 lb (123.8 kg)  09/10/23 267 lb 9.6 oz (121.4 kg)  08/24/23 265 lb (120.2 kg)  08/10/23 265 lb (120.2  kg)  06/08/23 267 lb (121.1 kg)  03/05/23 265 lb (120.2 kg)  11/30/22 271 lb (122.9 kg)  11/26/22 269 lb 9.6 oz (122.3 kg)  09/03/22 263 lb 12.8 oz (119.7 kg)  08/17/22 261 lb 12.8 oz (118.8 kg)   Constitutional: overweight, in NAD Eyes:  EOMI, no exophthalmos ENT: no neck masses, no cervical lymphadenopathy Cardiovascular: RRR, No MRG Respiratory: CTA B Musculoskeletal: no deformities Skin:no rashes Neurological: no tremor with outstretched hands Diabetic Foot Exam - Simple   Simple Foot Form Diabetic Foot exam was performed with the following findings: Yes 03/16/2024  9:10 AM  Visual Inspection No deformities, no ulcerations, no other skin breakdown bilaterally: Yes Sensation Testing Intact to touch and monofilament testing bilaterally: Yes Pulse Check Posterior Tibialis and Dorsalis pulse intact bilaterally: Yes Comments    ASSESSMENT: 1. DM2, non-insulin-dependent, uncontrolled, with complications - PN  He does not have a family history of medullary thyroid  cancer or personal history of pancreatitis.  2. HL  3.  Vitamin B12 deficiency  PLAN:  1. Patient with type 2 diabetes, controlled, diagnosed after a syncopal episode in 09/2019.  He was initially on metformin  IR, but he could not tolerate this reportedly due to palpitations.  He tolerates the ER formulation well.  He saw nutrition and started to improve his diet and stop sweet drinks after his diagnosis, with subsequent improvement in blood sugars.  We are managing him only with metformin .  At last visit, HbA1c was stable, at 6.2%.  We did not change his regimen as sugars were mostly at goal with only occasional slightly higher values after the larger lunch. - At today's visit, sugars are approximately the same as at last visit, but he only checks once a  day, before dinner.  Some of this blood sugars are slightly above target.  For now, I did not suggest a change in regimen, and I suspect that his increased HbA1c is  actually due to having had a protracted URI and having been on prednisone , cough syrup, and cough drops.  However, I did advise him to try to improve the blood sugars throughout the day, not only check before dinner.  He agrees to do this. - I suggested to:  Patient Instructions  Please continue: - Metformin  ER 1500 mg with dinner  Also, continue: - Vitamin B12 1000 mcg daily  Please return in 6 months with your sugar log.   - we checked his HbA1c: 6.7% (slightly higher) - advised to check sugars at different times of the day - 1x a day, rotating check times - advised for yearly eye exams >> he is UTD - return to clinic in 6 months  2. HL - Latest lipid panel was reviewed from 05/2023: Fractions at goal with the exception of a low HDL: Lab Results  Component Value Date   CHOL 119 06/08/2023   HDL 28.20 (L) 06/08/2023   LDLCALC 59 06/08/2023   LDLDIRECT 86.0 08/19/2021   TRIG 162.0 (H) 06/08/2023   CHOLHDL 4 06/08/2023  -He continues on Lipitor 10 mg daily without side effects  3.  Vitamin B12 deficiency - He is initially on B12 injections but then switched to 1000 mcg B12 daily-he is taking this in the methylated form - Tingling improved after starting B12 but he still has some tingling in the tips of his fingers >> I suggested alpha lipoic acid 600 mg twice a day, which he continues - B12 level was normal at last visit on 09/10/2023: 783 - We tried Benfotiamine 300 mg twice a day but this did not help - He had mild numbness in his toes.  Foot exam was normal in 02/2023 and again today.  Emilie Harden, MD PhD [facility] Endocr[facility]

## 2024-03-16 NOTE — Patient Instructions (Signed)
Please continue: - Metformin ER 1500 mg with dinner  Also, continue: - Vitamin B12 1000 mcg daily  Please return in 6 months with your sugar log.

## 2024-03-17 ENCOUNTER — Encounter: Payer: Self-pay | Admitting: Internal Medicine

## 2024-03-17 LAB — MICROALBUMIN / CREATININE URINE RATIO
Creatinine, Urine: 242 mg/dL (ref 20–320)
Microalb Creat Ratio: 3 mg/g{creat} (ref <30)
Microalb, Ur: 0.7 mg/dL

## 2024-05-02 DIAGNOSIS — G4733 Obstructive sleep apnea (adult) (pediatric): Secondary | ICD-10-CM | POA: Diagnosis not present

## 2024-05-26 ENCOUNTER — Other Ambulatory Visit: Payer: Self-pay | Admitting: Family Medicine

## 2024-05-26 DIAGNOSIS — I10 Essential (primary) hypertension: Secondary | ICD-10-CM

## 2024-06-01 DIAGNOSIS — G4733 Obstructive sleep apnea (adult) (pediatric): Secondary | ICD-10-CM | POA: Diagnosis not present

## 2024-06-07 ENCOUNTER — Encounter: Payer: BC Managed Care – PPO | Admitting: Family Medicine

## 2024-06-08 ENCOUNTER — Ambulatory Visit: Payer: BC Managed Care – PPO | Admitting: Family Medicine

## 2024-06-08 ENCOUNTER — Encounter: Payer: Self-pay | Admitting: Family Medicine

## 2024-06-08 VITALS — BP 120/80 | HR 75 | Temp 97.3°F | Ht 75.0 in | Wt 264.4 lb

## 2024-06-08 DIAGNOSIS — E538 Deficiency of other specified B group vitamins: Secondary | ICD-10-CM

## 2024-06-08 DIAGNOSIS — Z0000 Encounter for general adult medical examination without abnormal findings: Secondary | ICD-10-CM

## 2024-06-08 DIAGNOSIS — Z23 Encounter for immunization: Secondary | ICD-10-CM

## 2024-06-08 DIAGNOSIS — E7800 Pure hypercholesterolemia, unspecified: Secondary | ICD-10-CM

## 2024-06-08 DIAGNOSIS — Z7984 Long term (current) use of oral hypoglycemic drugs: Secondary | ICD-10-CM

## 2024-06-08 DIAGNOSIS — Z125 Encounter for screening for malignant neoplasm of prostate: Secondary | ICD-10-CM | POA: Diagnosis not present

## 2024-06-08 DIAGNOSIS — E1140 Type 2 diabetes mellitus with diabetic neuropathy, unspecified: Secondary | ICD-10-CM

## 2024-06-08 DIAGNOSIS — I10 Essential (primary) hypertension: Secondary | ICD-10-CM

## 2024-06-08 LAB — URINALYSIS, ROUTINE W REFLEX MICROSCOPIC
Ketones, ur: NEGATIVE
Leukocytes,Ua: NEGATIVE
Nitrite: NEGATIVE
Specific Gravity, Urine: 1.025 (ref 1.000–1.030)
Urine Glucose: NEGATIVE
Urobilinogen, UA: 0.2 (ref 0.0–1.0)
pH: 6 (ref 5.0–8.0)

## 2024-06-08 LAB — COMPREHENSIVE METABOLIC PANEL WITH GFR
ALT: 42 U/L (ref 0–53)
AST: 26 U/L (ref 0–37)
Albumin: 4.8 g/dL (ref 3.5–5.2)
Alkaline Phosphatase: 57 U/L (ref 39–117)
BUN: 19 mg/dL (ref 6–23)
CO2: 25 meq/L (ref 19–32)
Calcium: 9.4 mg/dL (ref 8.4–10.5)
Chloride: 102 meq/L (ref 96–112)
Creatinine, Ser: 0.95 mg/dL (ref 0.40–1.50)
GFR: 90.3 mL/min (ref >60.00)
Glucose, Bld: 151 mg/dL — ABNORMAL HIGH (ref 70–99)
Potassium: 4.3 meq/L (ref 3.5–5.1)
Sodium: 137 meq/L (ref 135–145)
Total Bilirubin: 0.8 mg/dL (ref 0.2–1.2)
Total Protein: 6.8 g/dL (ref 6.0–8.3)

## 2024-06-08 LAB — LIPID PANEL
Cholesterol: 114 mg/dL (ref 0–200)
HDL: 24.8 mg/dL — ABNORMAL LOW (ref >39.00)
LDL Cholesterol: 62 mg/dL (ref 0–99)
NonHDL: 89.49
Total CHOL/HDL Ratio: 5
Triglycerides: 138 mg/dL (ref 0.0–149.0)
VLDL: 27.6 mg/dL (ref 0.0–40.0)

## 2024-06-08 LAB — CBC WITH DIFFERENTIAL/PLATELET
Basophils Absolute: 0.1 K/uL (ref 0.0–0.1)
Basophils Relative: 2.1 % (ref 0.0–3.0)
Eosinophils Absolute: 0.1 K/uL (ref 0.0–0.7)
Eosinophils Relative: 3.4 % (ref 0.0–5.0)
HCT: 43.9 % (ref 39.0–52.0)
Hemoglobin: 15.4 g/dL (ref 13.0–17.0)
Lymphocytes Relative: 30.2 % (ref 12.0–46.0)
Lymphs Abs: 1.1 K/uL (ref 0.7–4.0)
MCHC: 35 g/dL (ref 30.0–36.0)
MCV: 90.5 fl (ref 78.0–100.0)
Monocytes Absolute: 0.2 K/uL (ref 0.1–1.0)
Monocytes Relative: 6.6 % (ref 3.0–12.0)
Neutro Abs: 2 K/uL (ref 1.4–7.7)
Neutrophils Relative %: 57.7 % (ref 43.0–77.0)
Platelets: 172 K/uL (ref 150.0–400.0)
RBC: 4.85 Mil/uL (ref 4.22–5.81)
RDW: 12.6 % (ref 11.5–15.5)
WBC: 3.5 K/uL — ABNORMAL LOW (ref 4.0–10.5)

## 2024-06-08 LAB — VITAMIN B12: Vitamin B-12: 803 pg/mL (ref 211–911)

## 2024-06-08 LAB — PSA: PSA: 0.18 ng/mL (ref 0.10–4.00)

## 2024-06-08 NOTE — Progress Notes (Signed)
 Established Patient Office Visit   Subjective:  Patient ID: Terry Bonilla, male    DOB: 17-Nov-1971  Age: 53 y.o. MRN: [id number]  Chief Complaint  Patient presents with   Annual Exam    Pt is fasting    HPI Encounter Diagnoses  Name Primary?   Healthcare maintenance Yes   Elevated LDL cholesterol level    Essential hypertension    Screening for prostate cancer    Type 2 diabetes mellitus with diabetic neuropathy, without long-term current use of insulin (HCC)    B12 deficiency    Immunization due    For physical exam and follow-up of the above.  Exercising for 25 minutes 4-5 times weekly.  Has regular dental care.  Has been able to lose some weight.  Continues to follow-up with endocrinology for type 2 diabetes.  Blood pressure controlled with lisinopril  10 mg.  Continues atorvastatin  for elevated LDL cholesterol.  Reports nasal congestion with a chronic cough that tends to show up in the colder months.   Review of Systems  Constitutional: Negative.   HENT: Negative.    Eyes:  Negative for blurred vision, discharge and redness.  Respiratory: Negative.    Cardiovascular: Negative.   Gastrointestinal:  Negative for abdominal pain.  Genitourinary: Negative.   Musculoskeletal: Negative.  Negative for myalgias.  Skin:  Negative for rash.  Neurological:  Negative for tingling, loss of consciousness and weakness.  Endo/Heme/Allergies:  Negative for polydipsia.     Current Outpatient Medications:    albuterol (VENTOLIN HFA) 108 (90 Base) MCG/ACT inhaler, Inhale 1-2 puffs into the lungs every 6 (six) hours as needed., Disp: , Rfl:    alfuzosin (UROXATRAL) 10 MG 24 hr tablet, Take 10 mg by mouth daily., Disp: , Rfl:    Alpha Lipoic Acid 200 MG CAPS, Take 1,200 mg by mouth daily., Disp: , Rfl:    ammonium lactate  (AMLACTIN) 12 % cream, Apply topically as needed for dry skin., Disp: 385 g, Rfl: 0   atorvastatin  (LIPITOR) 10 MG tablet, TAKE 1 TABLET BY MOUTH EVERY DAY IN THE  EVENING, Disp: 90 tablet, Rfl: 3   betamethasone dipropionate 0.05 % cream, Apply 1 application topically as needed., Disp: , Rfl:    Blood Glucose Calibration (ASCENSIA CONTOUR VI), by In Vitro route., Disp: , Rfl:    Blood Glucose Monitoring Suppl (FREESTYLE FREEDOM LITE) w/Device KIT, 1 kit by Does not apply route as needed., Disp: 1 kit, Rfl: 0   finasteride (PROPECIA) 1 MG tablet, Take 1 tablet by mouth daily., Disp: , Rfl:    fluticasone (FLONASE) 50 MCG/ACT nasal spray, Place 2 sprays into both nostrils daily., Disp: 16 g, Rfl: 6   Glucose Blood (ASCENSIA CONTOUR TEST VI), 100 each by In Vitro route 1 day or 1 dose., Disp: , Rfl:    Lancets (FREESTYLE) lancets, USE AS DIRECTED AS NEEDED, Disp: 100 each, Rfl: 0   latanoprost (XALATAN) 0.005 % ophthalmic solution, 1 drop at bedtime., Disp: , Rfl:    lisinopril  (ZESTRIL ) 10 MG tablet, TAKE 1 TABLET BY MOUTH EVERY DAY, Disp: 90 tablet, Rfl: 0   metFORMIN  (GLUCOPHAGE -XR) 750 MG 24 hr tablet, Take 1500 mg by mouth with dinner, Disp: 180 tablet, Rfl: 3   triamcinolone  (KENALOG ) 0.025 % ointment, Apply 1 application topically 2 (two) times daily. Not for face or private areas., Disp: 80 g, Rfl: 0   vitamin B-12 (CYANOCOBALAMIN ) 1000 MCG tablet, Take 1,000 mcg by mouth daily., Disp: , Rfl:   Current Facility-Administered  Medications:    0.9 %  sodium chloride  infusion, 500 mL, Intravenous, Once, Charlanne Groom, MD   Objective:     BP 120/80 (BP Location: Left Arm, Patient Position: Sitting)   Pulse 75   Temp (!) 97.3 F (36.3 C) (Temporal)   Ht 6' 3 (1.905 m)   Wt 264 lb 6.4 oz (119.9 kg)   SpO2 99%   BMI 33.05 kg/m  Wt Readings from Last 3 Encounters:  06/08/24 264 lb 6.4 oz (119.9 kg)  03/16/24 265 lb 12.8 oz (120.6 kg)  11/29/23 273 lb (123.8 kg)      Physical Exam Constitutional:      General: He is not in acute distress.    Appearance: Normal appearance. He is not ill-appearing, toxic-appearing or diaphoretic.  HENT:      Head: Normocephalic and atraumatic.     Right Ear: Tympanic membrane, ear canal and external ear normal.     Left Ear: Tympanic membrane, ear canal and external ear normal.     Mouth/Throat:     Mouth: Mucous membranes are moist.     Pharynx: Oropharynx is clear. No oropharyngeal exudate or posterior oropharyngeal erythema.  Eyes:     General: No scleral icterus.       Right eye: No discharge.        Left eye: No discharge.     Extraocular Movements: Extraocular movements intact.     Conjunctiva/sclera: Conjunctivae normal.     Pupils: Pupils are equal, round, and reactive to light.  Cardiovascular:     Rate and Rhythm: Normal rate and regular rhythm.  Pulmonary:     Effort: Pulmonary effort is normal. No respiratory distress.     Breath sounds: Normal breath sounds. No wheezing or rales.  Abdominal:     General: Bowel sounds are normal.     Tenderness: There is no abdominal tenderness. There is no guarding or rebound.     Hernia: There is no hernia in the left inguinal area or right inguinal area.  Genitourinary:    Penis: Circumcised. No hypospadias, erythema, tenderness, discharge, swelling or lesions.      Testes:        Right: Mass, tenderness or swelling not present. Right testis is descended.        Left: Mass, tenderness or swelling not present. Left testis is descended.     Epididymis:     Right: Not inflamed.     Left: Not inflamed.  Musculoskeletal:     Cervical back: No rigidity or tenderness.  Lymphadenopathy:     Lower Body: No right inguinal adenopathy. No left inguinal adenopathy.  Skin:    General: Skin is warm and dry.  Neurological:     Mental Status: He is alert and oriented to person, place, and time.  Psychiatric:        Mood and Affect: Mood normal.        Behavior: Behavior normal.      No results found for any visits on 06/08/24.    The ASCVD Risk score (Arnett DK, et al., 2019) failed to calculate for the following reasons:   The valid total  cholesterol range is 130 to 320 mg/dL    Assessment & Plan:   Healthcare maintenance  Elevated LDL cholesterol level -     Comprehensive metabolic panel with GFR -     Lipid panel  Essential hypertension -     CBC with Differential/Platelet -     Comprehensive metabolic panel  with GFR -     Urinalysis, Routine w reflex microscopic  Screening for prostate cancer -     PSA  Type 2 diabetes mellitus with diabetic neuropathy, without long-term current use of insulin (HCC)  B12 deficiency -     Vitamin B12  Immunization due -     Varicella-zoster vaccine subcutaneous    Return in about 1 year (around 06/08/2025), or Return in 2 to 6 months for second Shingrix vaccine., for chronic disease follow-up, annual physical.  Encouraged him to continue weight loss efforts.  Information was given on exercising to lose weight.  Information was given on the maintenance and disease prevention.  Urged follow-up in the cooler months with return of nasal congestion and cough.   Elsie Sim Lent, MD

## 2024-06-09 ENCOUNTER — Ambulatory Visit: Payer: Self-pay | Admitting: Family Medicine

## 2024-07-02 DIAGNOSIS — G4733 Obstructive sleep apnea (adult) (pediatric): Secondary | ICD-10-CM | POA: Diagnosis not present

## 2024-08-02 DIAGNOSIS — G4733 Obstructive sleep apnea (adult) (pediatric): Secondary | ICD-10-CM | POA: Diagnosis not present

## 2024-08-17 DIAGNOSIS — D2271 Melanocytic nevi of right lower limb, including hip: Secondary | ICD-10-CM | POA: Diagnosis not present

## 2024-08-17 DIAGNOSIS — D485 Neoplasm of uncertain behavior of skin: Secondary | ICD-10-CM | POA: Diagnosis not present

## 2024-08-17 DIAGNOSIS — D225 Melanocytic nevi of trunk: Secondary | ICD-10-CM | POA: Diagnosis not present

## 2024-08-17 DIAGNOSIS — L821 Other seborrheic keratosis: Secondary | ICD-10-CM | POA: Diagnosis not present

## 2024-08-30 ENCOUNTER — Other Ambulatory Visit: Payer: Self-pay | Admitting: Family Medicine

## 2024-08-30 DIAGNOSIS — I10 Essential (primary) hypertension: Secondary | ICD-10-CM

## 2024-09-08 DIAGNOSIS — H40023 Open angle with borderline findings, high risk, bilateral: Secondary | ICD-10-CM | POA: Diagnosis not present

## 2024-09-14 ENCOUNTER — Encounter: Payer: Self-pay | Admitting: Internal Medicine

## 2024-09-14 ENCOUNTER — Ambulatory Visit: Admitting: Internal Medicine

## 2024-09-14 VITALS — BP 122/70 | HR 85 | Ht 75.0 in | Wt 265.0 lb

## 2024-09-14 DIAGNOSIS — Z7984 Long term (current) use of oral hypoglycemic drugs: Secondary | ICD-10-CM | POA: Diagnosis not present

## 2024-09-14 DIAGNOSIS — E538 Deficiency of other specified B group vitamins: Secondary | ICD-10-CM | POA: Diagnosis not present

## 2024-09-14 DIAGNOSIS — E1140 Type 2 diabetes mellitus with diabetic neuropathy, unspecified: Secondary | ICD-10-CM | POA: Diagnosis not present

## 2024-09-14 DIAGNOSIS — E785 Hyperlipidemia, unspecified: Secondary | ICD-10-CM

## 2024-09-14 LAB — POCT GLYCOSYLATED HEMOGLOBIN (HGB A1C): Hemoglobin A1C: 6.5 % — AB (ref 4.0–5.6)

## 2024-09-14 NOTE — Progress Notes (Signed)
 Patient ID: Terry Bonilla, male   DOB: 1971/06/25, 53 y.o.   MRN: [id number]   HPI: Terry Bonilla is a 53 y.o.-year-old male, referred by his PCP, Dr. Berneta, for management of DM2, dx on 10/15/2019, non-insulin-dependent, uncontrolled, with long-term complications (peripheral neuropathy).  Last visit 6 months ago.  Interim history: No increased urination, blurry vision, nausea, chest pain.    DM2: Reviewed history: He was diagnosed with diabetes in 09/2019 after he presented to the ED with a syncopal episode.  In ED, glucose was 372 and he had glycosuria on urinalysis.  HbA1c was high.  He was started on Metformin .  Reviewed HbA1c levels: Lab Results  Component Value Date   HGBA1C 6.7 (A) 03/16/2024   HGBA1C 6.2 (A) 03/05/2023   HGBA1C 6.2 (A) 09/03/2022   HGBA1C 5.6 09/01/2021   HGBA1C 5.7 (A) 04/11/2021   HGBA1C 6.7 (A) 12/13/2020   HGBA1C 6.8 (A) 08/05/2020   HGBA1C 6.6 (A) 04/01/2020   HGBA1C 6.4 (A) 01/02/2020   HGBA1C 10.0 (H) 10/23/2019  09/10/2023: HbA1c 6.2%  Pt is on a regimen of:  - Metformin  ER 750 mg 1x a day >> 1500 mg with dinner >> lightheadedness >> 750 mg 2x a day >> 1500 mg at dinnertime We discussed about Rybelsus  3 mg before breakfast at last OV - 07/2020, but not started yet.  He had palpitations with an instant release Metformin .  Pt checks his sugars twice a day: - am: 112-139, 144 >> 118, 126-138, 145 >> n/c >> 142-158 - 2h after b'fast: n/c - before lunch: n/c - 2h after lunch: n/c - before dinner: 87-140, 143 >> 88-144, 154 >> 104-144 >>102-144, 157 - 2h after dinner: n/c - bedtime: 130, 141-169, 209 >> n/c - nighttime: n/c Lowest sugar was 130 >> ...  80 (after walking) >> 88 >> 100; it is unclear at which level he has hypoglycemia awareness. Highest sugar was 209 >> .SABRASABRA 154 >> 150 >> 158.  Glucometer: [employer]  Feels improved after his diagnosis of diabetes: - Breakfast: [place] links >> protein bar - snack: bar - Lunch: chicken and  broccoli >> lean green lunch: greens + white meat - pm: nuts >> brownie - Dinner: chicken >> lean and green dinner - Snack after dinner:bar He stopped sweet drinks.  He drinks 1-2 alcoholic drinks at night.  -No CKD, last BUN/creatinine:  Lab Results  Component Value Date   BUN 19 06/08/2024   BUN 16 01/18/2024   CREATININE 0.95 06/08/2024   CREATININE 0.75 01/18/2024   Lab Results  Component Value Date   MICRALBCREAT 3 03/16/2024  We started lisinopril  5 mg daily in 08/2021. He increased to 10 mg daily 08/2022.  -+ HL; last set of lipids: Lab Results  Component Value Date   CHOL 114 06/08/2024   HDL 24.80 (L) 06/08/2024   LDLCALC 62 06/08/2024   LDLDIRECT 86.0 08/19/2021   TRIG 138.0 06/08/2024   CHOLHDL 5 06/08/2024  On Lipitor 10.  - last eye exam was in 02/2024: No DR reportedly (Dr. Thom Hamilton). He is on Travaprost for increased pressure.  -+ Numbness and tingling in his feet. He saw by podiatry for an ingrown toenail.  He had onychodystrophy, ingrown toenails.  He takes alpha lipoid acid.  He had surgery x 2.  Last foot exam (here in clinic) was in 03/16/2024.  Pt has FH of DM in elder brother.  Vitamin B12 deficiency:  We started B12 injections.  His shooting pain in  his legs improved after starting this.  We were able to transition him to 1000 mcg p.o. B12.  Reviewed his B12 levels: Lab Results  Component Value Date   VITAMINB12 803 06/08/2024   VITAMINB12 783 09/10/2023   VITAMINB12 888 02/12/2022   VITAMINB12 741 08/19/2021   VITAMINB12 790 11/19/2020   VITAMINB12 473 08/02/2020   VITAMINB12 389 05/14/2020   VITAMINB12 196 (L) 01/30/2020   Allergic to sulfa.  She was on alfuzosin and meloxicam for prostatitis in the past.  He feels that these medications improved his dizziness.  He is now off these medications and dizziness has not recurred. He also has a history of alcohol use and transaminitis. LFTs were normal in 07/2021. He started on a vitamin D +  K2 supplement in 07/2024.  ROS: + See HPI  I reviewed pt's medications, allergies, PMH, social hx, family hx, and changes were documented in the history of present illness. Otherwise, unchanged from my initial visit note.  Past Medical History:  Diagnosis Date   Diabetes mellitus without complication (HCC) 09/2019   pre-diabetes A1C 6.4   Hypertension    Sleep apnea 2009   cpap   Substance abuse (HCC)    ETOH   Past Surgical History:  Procedure Laterality Date   KIDNEY SURGERY Left 08/09/2020   benign cyst laser removal    TONSILLECTOMY     age 35   Social History   Socioeconomic History   Marital status: Single    Spouse name: Not on file   Number of children: Not on file   Years of education: Not on file   Highest education level: Not on file  Occupational History   Not on file  Tobacco Use   Smoking status: Never   Smokeless tobacco: Never  Vaping Use   Vaping status: Never Used  Substance and Sexual Activity   Alcohol use: Yes    Alcohol/week: 4.0 - 6.0 standard drinks of alcohol    Types: 4 - 6 Standard drinks or equivalent per week    Comment: two drinks nightly occasionally    Drug use: Never   Sexual activity: Not on file  Other Topics Concern   Not on file  Social History Narrative   Lives alone   Right handed   Caffeine: 1 cup/day   Social Drivers of Corporate investment banker Strain: Not on file  Food Insecurity: Not on file  Transportation Needs: Not on file  Physical Activity: Not on file  Stress: Not on file  Social Connections: Not on file  Intimate Partner Violence: Not on file   Current Outpatient Medications on File Prior to Visit  Medication Sig Dispense Refill   albuterol (VENTOLIN HFA) 108 (90 Base) MCG/ACT inhaler Inhale 1-2 puffs into the lungs every 6 (six) hours as needed.     alfuzosin (UROXATRAL) 10 MG 24 hr tablet Take 10 mg by mouth daily.     Alpha Lipoic Acid 200 MG CAPS Take 1,200 mg by mouth daily.     ammonium  lactate (AMLACTIN) 12 % cream Apply topically as needed for dry skin. 385 g 0   atorvastatin  (LIPITOR) 10 MG tablet TAKE 1 TABLET BY MOUTH EVERY DAY IN THE EVENING 90 tablet 3   betamethasone dipropionate 0.05 % cream Apply 1 application topically as needed.     Blood Glucose Calibration (ASCENSIA CONTOUR VI) by In Vitro route.     Blood Glucose Monitoring Suppl (FREESTYLE FREEDOM LITE) w/Device KIT 1 kit by  Does not apply route as needed. 1 kit 0   finasteride (PROPECIA) 1 MG tablet Take 1 tablet by mouth daily.     fluticasone (FLONASE) 50 MCG/ACT nasal spray Place 2 sprays into both nostrils daily. 16 g 6   Glucose Blood (ASCENSIA CONTOUR TEST VI) 100 each by In Vitro route 1 day or 1 dose.     Lancets (FREESTYLE) lancets USE AS DIRECTED AS NEEDED 100 each 0   latanoprost (XALATAN) 0.005 % ophthalmic solution 1 drop at bedtime.     lisinopril  (ZESTRIL ) 10 MG tablet TAKE 1 TABLET BY MOUTH EVERY DAY 90 tablet 0   metFORMIN  (GLUCOPHAGE -XR) 750 MG 24 hr tablet Take 1500 mg by mouth with dinner 180 tablet 3   triamcinolone  (KENALOG ) 0.025 % ointment Apply 1 application topically 2 (two) times daily. Not for face or private areas. 80 g 0   vitamin B-12 (CYANOCOBALAMIN ) 1000 MCG tablet Take 1,000 mcg by mouth daily.     Current Facility-Administered Medications on File Prior to Visit  Medication Dose Route Frequency Provider Last Rate Last Admin   0.9 %  sodium chloride  infusion  500 mL Intravenous Once Charlanne Groom, MD       Allergies  Allergen Reactions   Celecoxib Hives   Crab [Shellfish Allergy] Hives   Elemental Sulfur Rash   Sulfa Antibiotics Hives   PMH: -Pertinent PMH reviewed in HPI  PE: BP 122/70   Pulse 85   Ht 6' 3 (1.905 m)   Wt 265 lb (120.2 kg)   SpO2 97%   BMI 33.12 kg/m  Wt Readings from Last 10 Encounters:  09/14/24 265 lb (120.2 kg)  06/08/24 264 lb 6.4 oz (119.9 kg)  03/16/24 265 lb 12.8 oz (120.6 kg)  11/29/23 273 lb (123.8 kg)  09/10/23 267 lb 9.6 oz  (121.4 kg)  08/24/23 265 lb (120.2 kg)  08/10/23 265 lb (120.2 kg)  06/08/23 267 lb (121.1 kg)  03/05/23 265 lb (120.2 kg)  11/30/22 271 lb (122.9 kg)   Constitutional: overweight, in NAD Eyes:  EOMI, no exophthalmos ENT: no neck masses, no cervical lymphadenopathy Cardiovascular: RRR, No MRG Respiratory: CTA B Musculoskeletal: no deformities Skin:no rashes Neurological: no tremor with outstretched hands  ASSESSMENT: 1. DM2, non-insulin-dependent, uncontrolled, with complications - PN  He does not have a family history of medullary thyroid  cancer or personal history of pancreatitis.  2. HL  3.  Vitamin B12 deficiency  PLAN:  1. Patient with type 2 diabetes, controlled, diagnosed after a syncopal episode in 09/2019.  He was initially on metformin  IR but he could not tolerate this reportedly due to palpitations.  However, he tolerated the ER formulation well.  At last visit, sugars were stable, but he was only checking once a day, before dinner.  Some of these blood sugars were slightly above target.  I did not suggest a change in regimen at that time since he did have a protracted URI and had not been on prednisone , cough syrup, and cough drops.  HbA1c was higher, at 6.7%, increased from 6.2%.  I did advise him to check blood sugars at other times of the day, also, rotating check times. - He saw nutrition and started to improve his diet and stopped sweet drinks after his diagnosis, with subsequent improvement in blood sugars.   - At today's visit, sugars in the morning are elevated.  Later in the day, they are more frequently at home.  Upon questioning, he is having a breakfast bar after  dinner, as he is still hungry after he finishes the meal.  We discussed about possibly starting this but also, for a large meal, to try to increase the metformin  dose by taking another tablet after dinner, especially with the holidays coming up. - I suggested to:  Patient Instructions  Please use: -  Metformin  ER 1500(-2250) mg with dinner  Also, continue: - Vitamin B12 1000 mcg daily  Try to eliminate the b'fast bar after dinner.  Please return in 6 months with your sugar log (before 03/14/2025)  - we checked his HbA1c: 6.5% (lower) - advised to check sugars at different times of the day - 1x a day, rotating check times - advised for yearly eye exams >> he is UTD - return to clinic in 6 months  2. HL - Latest lipid panel showed fractions at goal except for low HDL in 05/2024: Lab Results  Component Value Date   CHOL 114 06/08/2024   HDL 24.80 (L) 06/08/2024   LDLCALC 62 06/08/2024   LDLDIRECT 86.0 08/19/2021   TRIG 138.0 06/08/2024   CHOLHDL 5 06/08/2024  -He continues on Lipitor 10 mg daily without side effects  3.  Vitamin B12 deficiency - He is initially on B12 injections but then switched to 1000 mcg B12 daily- he is taking this in the methylated form - Tingling improved after starting B12 but he still has some tingling in the tips of his fingers >> I suggested alpha lipoic acid 600 mg twice a day, which he continues - Latest B12 level was normal in 08/2023 and in 07.2025 - We tried Benfotiamine 300 mg twice a day but this did not help - He has mild numbness in his toes.  His foot exam was normal at last visit (03/16/2024)  Lela Fendt, MD PhD [facility]

## 2024-09-14 NOTE — Patient Instructions (Addendum)
 Please use: - Metformin  ER 1500(-2250) mg with dinner  Also, continue: - Vitamin B12 1000 mcg daily  Try to eliminate the b'fast bar after dinner.  Please return in 6 months with your sugar log (before 03/14/2025)

## 2024-09-14 NOTE — Addendum Note (Signed)
 Addended by: CLEOTILDE ROLIN RAMAN on: 09/14/2024 09:53 AM   Modules accepted: Orders

## 2024-10-02 DIAGNOSIS — G4733 Obstructive sleep apnea (adult) (pediatric): Secondary | ICD-10-CM | POA: Diagnosis not present

## 2024-10-05 ENCOUNTER — Ambulatory Visit (INDEPENDENT_AMBULATORY_CARE_PROVIDER_SITE_OTHER)

## 2024-10-05 DIAGNOSIS — Z23 Encounter for immunization: Secondary | ICD-10-CM

## 2024-10-05 NOTE — Progress Notes (Signed)
 After obtaining consent, and per orders of Dr. Berneta, injection of Shingrix given IM in the left deltoid by Dedra PARAS Morenci-White. Patient instructed to remain in clinic for 20 minutes afterwards, and to report any adverse reaction to me immediately. Pt tolerated well.

## 2024-11-01 DIAGNOSIS — G4733 Obstructive sleep apnea (adult) (pediatric): Secondary | ICD-10-CM | POA: Diagnosis not present

## 2024-11-25 NOTE — Progress Notes (Deleted)
 [facility] [address] [place], [place] [address]  Pulmonary Sleep Medicine   Office Visit Note  Patient Name: Terry Bonilla DOB: 10/18/1971 MRN [id number]    Chief Complaint: Obstructive Sleep Apnea visit  Brief History:  Latasha is seen today for an annual follow up on CPAP@ 10 cmH20. The patient has a 8.5 year history of sleep apnea. Patient is using PAP nightly.  The patient feels *** after sleeping with PAP.  The patient reports *** from PAP use. Reported sleepiness is  *** and the Epworth Sleepiness Score is *** out of 24. The patient *** take naps. The patient complains of the following: ***  The compliance download shows 99% compliance with an average use time of 8 hours 59 minutes. The AHI is 4.3  The patient *** of limb movements disrupting sleep.  ROS  General: (-) fever, (-) chills, (-) night sweat Nose and Sinuses: (-) nasal stuffiness or itchiness, (-) postnasal drip, (-) nosebleeds, (-) sinus trouble. Mouth and Throat: (-) sore throat, (-) hoarseness. Neck: (-) swollen glands, (-) enlarged thyroid , (-) neck pain. Respiratory: *** cough, *** shortness of breath, *** wheezing. Neurologic: *** numbness, *** tingling. Psychiatric: *** anxiety, *** depression   Current Medication: Outpatient Encounter Medications as of 11/27/2024  Medication Sig Note   albuterol (VENTOLIN HFA) 108 (90 Base) MCG/ACT inhaler Inhale 1-2 puffs into the lungs every 6 (six) hours as needed.    alfuzosin (UROXATRAL) 10 MG 24 hr tablet Take 10 mg by mouth daily. 08/17/2022: PRN   Alpha Lipoic Acid 200 MG CAPS Take 1,200 mg by mouth daily.    ammonium lactate  (AMLACTIN) 12 % cream Apply topically as needed for dry skin. 08/02/2020: prn   atorvastatin  (LIPITOR) 10 MG tablet TAKE 1 TABLET BY MOUTH EVERY DAY IN THE EVENING    betamethasone dipropionate 0.05 % cream Apply 1 application topically as needed. 08/02/2020: prn   Blood Glucose Calibration (ASCENSIA CONTOUR VI) by In Vitro  route.    Blood Glucose Monitoring Suppl (FREESTYLE FREEDOM LITE) w/Device KIT 1 kit by Does not apply route as needed.    finasteride (PROPECIA) 1 MG tablet Take 1 tablet by mouth daily.    fluticasone (FLONASE) 50 MCG/ACT nasal spray Place 2 sprays into both nostrils daily.    Glucose Blood (ASCENSIA CONTOUR TEST VI) 100 each by In Vitro route 1 day or 1 dose.    Lancets (FREESTYLE) lancets USE AS DIRECTED AS NEEDED    latanoprost (XALATAN) 0.005 % ophthalmic solution 1 drop at bedtime.    lisinopril  (ZESTRIL ) 10 MG tablet TAKE 1 TABLET BY MOUTH EVERY DAY    metFORMIN  (GLUCOPHAGE -XR) 750 MG 24 hr tablet Take 1500 mg by mouth with dinner    triamcinolone  (KENALOG ) 0.025 % ointment Apply 1 application topically 2 (two) times daily. Not for face or private areas.    vitamin B-12 (CYANOCOBALAMIN ) 1000 MCG tablet Take 1,000 mcg by mouth daily.    Facility-Administered Encounter Medications as of 11/27/2024  Medication   0.9 %  sodium chloride  infusion    Surgical History: Past Surgical History:  Procedure Laterality Date   KIDNEY SURGERY Left 08/09/2020   benign cyst laser removal    TONSILLECTOMY     age 54    Medical History: Past Medical History:  Diagnosis Date   Diabetes mellitus without complication (HCC) 09/2019   pre-diabetes A1C 6.4   Hypertension    Sleep apnea 2009   cpap   Substance abuse (HCC)  ETOH    Family History: Non contributory to the present illness  Social History: Social History   Socioeconomic History   Marital status: Single    Spouse name: Not on file   Number of children: Not on file   Years of education: Not on file   Highest education level: Not on file  Occupational History   Not on file  Tobacco Use   Smoking status: Never   Smokeless tobacco: Never  Vaping Use   Vaping status: Never Used  Substance and Sexual Activity   Alcohol use: Yes    Alcohol/week: 4.0 - 6.0 standard drinks of alcohol    Types: 4 - 6 Standard drinks or  equivalent per week    Comment: two drinks nightly occasionally    Drug use: Never   Sexual activity: Not on file  Other Topics Concern   Not on file  Social History Narrative   Lives alone   Right handed   Caffeine: 1 cup/day   Social Drivers of Health   Tobacco Use: Low Risk (09/14/2024)   Patient History    Smoking Tobacco Use: Never    Smokeless Tobacco Use: Never    Passive Exposure: Not on file  Financial Resource Strain: Not on file  Food Insecurity: Not on file  Transportation Needs: Not on file  Physical Activity: Not on file  Stress: Not on file  Social Connections: Not on file  Intimate Partner Violence: Not on file  Depression (PHQ2-9): Low Risk (06/08/2024)   Depression (PHQ2-9)    PHQ-2 Score: 0  Alcohol Screen: Not on file  Housing: Not on file  Utilities: Not on file  Health Literacy: Not on file    Vital Signs: There were no vitals taken for this visit. There is no height or weight on file to calculate BMI.    Examination: General Appearance: The patient is well-developed, well-nourished, and in no distress. Neck Circumference: *** Skin: Gross inspection of skin unremarkable. Head: normocephalic, no gross deformities. Eyes: no gross deformities noted. ENT: ears appear grossly normal Neurologic: Alert and oriented. No involuntary movements.  STOP BANG RISK ASSESSMENT S (snore) Have you been told that you snore?     YES/NO   T (tired) Are you often tired, fatigued, or sleepy during the day?   YES/NO  O (obstruction) Do you stop breathing, choke, or gasp during sleep? YES/NO   P (pressure) Do you have or are you being treated for high blood pressure? YES/NO   B (BMI) Is your body index greater than 35 kg/m? YES/NO   A (age) Are you 56 years old or older? YES   N (neck) Do you have a neck circumference greater than 16 inches?   YES/NO   G (gender) Are you a male? YES   TOTAL STOP/BANG YES ANSWERS        A STOP-Bang score of 2 or less  is considered low risk, and a score of 5 or more is high risk for having either moderate or severe OSA. For people who score 3 or 4, doctors may need to perform further assessment to determine how likely they are to have OSA.         EPWORTH SLEEPINESS SCALE:  Scale:  (0)= no chance of dozing; (1)= slight chance of dozing; (2)= moderate chance of dozing; (3)= high chance of dozing  Chance  Situtation    Sitting and reading: ***    Watching TV: ***    Sitting Inactive in public: ***  As a passenger in car: ***      Lying down to rest: ***    Sitting and talking: ***    Sitting quielty after lunch: ***    In a car, stopped in traffic: ***   TOTAL SCORE:   *** out of 24    SLEEP STUDIES:  PSG (05/2008) AHI 46/hr, min SpO2 78% Titration (05/2008) CPAP@ 10 cmH2O Titration (03/2016) CPAP@ 10 cmH2O   CPAP COMPLIANCE DATA:  Date Range: 11/24/2023 - 11/22/2024  Average Daily Use: 8 hours 59 minutes  Median Use: 9 hours  Compliance for > 4 Hours: 99% days  AHI: 4.3 respiratory events per hour  Days Used: 365/365  Mask Leak: 26.0  95th Percentile Pressure: 10 cmH20         LABS: Recent Results (from the past 2160 hours)  POCT glycosylated hemoglobin (Hb A1C)     Status: Abnormal   Collection Time: 09/14/24  9:53 AM  Result Value Ref Range   Hemoglobin A1C 6.5 (A) 4.0 - 5.6 %   HbA1c POC (<> result, manual entry)     HbA1c, POC (prediabetic range)     HbA1c, POC (controlled diabetic range)      Radiology: No results found.  No results found.  No results found.    Assessment and Plan: Patient Active Problem List   Diagnosis Date Noted   CPAP use counseling 11/29/2023   Immunization due 06/08/2023   Bilateral low back pain without sciatica 11/26/2022   Essential hypertension 11/26/2022   Healthcare maintenance 07/20/2022   OSA on CPAP 07/21/2021   Encounter for hepatitis C screening test for low risk patient 07/21/2021   Elevated BP  without diagnosis of hypertension 01/31/2021   Dizziness 09/10/2020   Ingrown toenail 03/04/2020   B12 deficiency 01/31/2020   Neuropathy 01/31/2020   Elevated LDL cholesterol level 01/26/2020   Flexural eczema 01/26/2020   Lightheadedness 11/28/2019   Elevated liver enzymes 11/28/2019   Alcohol use 11/28/2019   Palpitations 10/30/2019   Type 2 diabetes mellitus with diabetic neuropathy, without long-term current use of insulin (HCC) 10/23/2019   Screening for blood disease 10/23/2019   Need for influenza vaccination 10/23/2019   Atypical nevus 10/23/2019   Obesity (BMI 30.0-34.9) 03/20/2016      The patient *** tolerate PAP and reports *** benefit from PAP use. The patient was reminded how to *** and advised to ***. The patient was also counselled on ***. The compliance is ***. The AHI is ***.   ***  General Counseling: I have discussed the findings of the evaluation and examination with Rankin.  I have also discussed any further diagnostic evaluation thatmay be needed or ordered today. Benji verbalizes understanding of the findings of todays visit. We also reviewed his medications today and discussed drug interactions and side effects including but not limited excessive drowsiness and altered mental states. We also discussed that there is always a risk not just to him but also people around him. he has been encouraged to call the office with any questions or concerns that should arise related to todays visit.  No orders of the defined types were placed in this encounter.       I have personally obtained a history, examined the patient, evaluated laboratory and imaging results, formulated the assessment and plan and placed orders.  Elfreda DELENA Bathe, MD [facility] Diplomate ABMS Pulmonary Critical Care Medicine and Sleep Medicine

## 2024-11-27 ENCOUNTER — Ambulatory Visit

## 2024-12-03 ENCOUNTER — Other Ambulatory Visit: Payer: Self-pay | Admitting: Family Medicine

## 2024-12-03 ENCOUNTER — Other Ambulatory Visit: Payer: Self-pay | Admitting: Internal Medicine

## 2024-12-03 DIAGNOSIS — E7800 Pure hypercholesterolemia, unspecified: Secondary | ICD-10-CM

## 2024-12-03 DIAGNOSIS — I10 Essential (primary) hypertension: Secondary | ICD-10-CM

## 2025-01-01 ENCOUNTER — Ambulatory Visit

## 2025-03-13 ENCOUNTER — Ambulatory Visit: Admitting: Internal Medicine

## 2025-06-14 ENCOUNTER — Encounter: Admitting: Family Medicine
